# Patient Record
Sex: Female | Born: 1975 | Race: White | Hispanic: No | Marital: Married | Smoking: Current every day smoker
Health system: Southern US, Community
[De-identification: ages and names within clinical notes are randomized; demographics above are authoritative.]

## PROBLEM LIST (undated history)

## (undated) DIAGNOSIS — K219 Gastro-esophageal reflux disease without esophagitis: Secondary | ICD-10-CM

## (undated) DIAGNOSIS — E079 Disorder of thyroid, unspecified: Secondary | ICD-10-CM

## (undated) DIAGNOSIS — D1803 Hemangioma of intra-abdominal structures: Secondary | ICD-10-CM

## (undated) DIAGNOSIS — I890 Lymphedema, not elsewhere classified: Secondary | ICD-10-CM

## (undated) DIAGNOSIS — R0600 Dyspnea, unspecified: Secondary | ICD-10-CM

## (undated) DIAGNOSIS — E039 Hypothyroidism, unspecified: Secondary | ICD-10-CM

## (undated) DIAGNOSIS — G4730 Sleep apnea, unspecified: Secondary | ICD-10-CM

## (undated) DIAGNOSIS — G4733 Obstructive sleep apnea (adult) (pediatric): Secondary | ICD-10-CM

## (undated) HISTORY — DX: Disorder of thyroid, unspecified: E07.9

## (undated) HISTORY — DX: Hemangioma of intra-abdominal structures: D18.03

## (undated) HISTORY — PX: MANDIBLE FRACTURE SURGERY: SHX706

## (undated) HISTORY — DX: Lymphedema, not elsewhere classified: I89.0

## (undated) HISTORY — DX: Obstructive sleep apnea (adult) (pediatric): G47.33

## (undated) HISTORY — PX: MULTIPLE TOOTH EXTRACTIONS: SHX2053

---

## 2003-01-12 HISTORY — PX: CHOLECYSTECTOMY: SHX55

## 2003-10-30 ENCOUNTER — Ambulatory Visit: Payer: Self-pay | Admitting: Emergency Medicine

## 2003-10-30 ENCOUNTER — Emergency Department: Payer: Self-pay | Admitting: Emergency Medicine

## 2004-04-05 ENCOUNTER — Observation Stay: Payer: Self-pay

## 2004-05-17 ENCOUNTER — Observation Stay: Payer: Self-pay

## 2004-06-12 ENCOUNTER — Inpatient Hospital Stay: Payer: Self-pay

## 2005-03-06 ENCOUNTER — Emergency Department: Payer: Self-pay | Admitting: Emergency Medicine

## 2005-05-20 ENCOUNTER — Emergency Department: Payer: Self-pay | Admitting: Emergency Medicine

## 2005-09-18 ENCOUNTER — Inpatient Hospital Stay: Payer: Self-pay | Admitting: Surgery

## 2005-12-22 ENCOUNTER — Emergency Department: Payer: Self-pay | Admitting: Emergency Medicine

## 2007-05-20 ENCOUNTER — Emergency Department: Payer: Self-pay | Admitting: Emergency Medicine

## 2007-05-20 ENCOUNTER — Other Ambulatory Visit: Payer: Self-pay

## 2008-06-23 ENCOUNTER — Emergency Department: Payer: Self-pay

## 2010-09-14 ENCOUNTER — Emergency Department: Payer: Self-pay | Admitting: Emergency Medicine

## 2011-04-29 ENCOUNTER — Emergency Department: Payer: Self-pay | Admitting: Emergency Medicine

## 2012-01-12 HISTORY — PX: TUBAL LIGATION: SHX77

## 2012-01-15 ENCOUNTER — Emergency Department: Payer: Self-pay | Admitting: Emergency Medicine

## 2012-01-16 LAB — WET PREP, GENITAL

## 2012-01-16 LAB — HCG, QUANTITATIVE, PREGNANCY: Beta Hcg, Quant.: 15616 m[IU]/mL — ABNORMAL HIGH

## 2012-01-16 LAB — URINALYSIS, COMPLETE
Blood: NEGATIVE
Glucose,UR: NEGATIVE mg/dL (ref 0–75)
Nitrite: NEGATIVE
Ph: 7 (ref 4.5–8.0)
Protein: NEGATIVE
RBC,UR: 3 /HPF (ref 0–5)

## 2012-01-19 LAB — URINE CULTURE

## 2012-05-11 ENCOUNTER — Observation Stay: Payer: Self-pay | Admitting: Obstetrics and Gynecology

## 2012-05-28 ENCOUNTER — Observation Stay: Payer: Self-pay | Admitting: Obstetrics and Gynecology

## 2012-06-04 ENCOUNTER — Inpatient Hospital Stay: Payer: Self-pay

## 2012-06-05 LAB — HEMATOCRIT: HCT: 29 % — ABNORMAL LOW (ref 35.0–47.0)

## 2013-05-15 ENCOUNTER — Encounter: Payer: Self-pay | Admitting: Internal Medicine

## 2013-05-25 ENCOUNTER — Ambulatory Visit: Payer: Self-pay | Admitting: Nurse Practitioner

## 2013-11-29 ENCOUNTER — Ambulatory Visit: Payer: Self-pay | Admitting: Gastroenterology

## 2014-05-03 NOTE — Op Note (Signed)
PATIENT NAME:  Karen Hill, Karen Hill MR#:  [id number] DATE OF BIRTH:  1975/08/06  DATE OF PROCEDURE:  06/04/2012  PREOPERATIVE DIAGNOSIS: Intrauterine pregnancy, term, failure to progress, fetal intolerance to labor, desires sterilization.   POSTOPERATIVE DIAGNOSIS:  Intrauterine pregnancy, term, failure to progress, fetal intolerance to labor, desires sterilization.   OPERATION PERFORMED:  1.  Low transverse cesarean section. 2.  Bilateral partial salpingectomy.   SURGEON: Verlene Mayer, MD    FIRST ASSISTANT: Wilburn Cornelia  PREOP SITUATION: The patient got to be 7 cm dilated and had secondary arrest despite IUPC and Pitocin. The patient later developed several fairly deep decelerations, therefore, a mutual decision was made to proceed with cesarean section.   OPERATIVE FINDINGS: Six-pound female infant, [place], delivered at 16:31. Cord was around the neck x 1, very tight, and lower uterine segment was very thick.   DESCRIPTION OF PROCEDURE: After adequate conduction anesthesia, the patient was prepped and draped in routine fashion. A skin incision in modified Pfannenstiel fashion was made and carried down the various layers, and the peritoneal cavity was entered. A bladder flap was created, and the bladder was pushed down. Of note, a transverse incision was made through a fairly thick lower uterine segment, and the above-described infant was delivered without difficulty. The placenta was removed manually. The uterus was then closed with continuous lock suture of chromic 1. Several additional sutures were required for hemostasis. A bilateral partial salpingectomy was then performed with both tubes being doubly ligated with plain suture and a portion removed. All areas of surgery were inspected and found to be hemostatic. The rectus muscles were reapproximated in the midline. An On-Q pump was placed. The fascia was reapproximated with continuous sutures of Max[place]. Several triple plain sutures were placed in  the fat, and the skin was closed with skin staples. Estimated loss was 500 mL.  The patient tolerated the procedure well and left the operating room in good condition. Sponge and needle counts were said to be correct at the end of the procedure.    ____________________________ Wonda Cheng. Laurey Morale, MD pjr:cb D: 06/04/2012 17:11:32 ET T: 06/04/2012 21:53:40 ET JOB#: [id number]  cc: Wonda Cheng. Laurey Morale, MD, <Dictator> Rosina Lowenstein MD ELECTRONICALLY SIGNED 06/05/2012 3:26

## 2014-05-06 LAB — SURGICAL PATHOLOGY

## 2014-05-21 NOTE — H&P (Signed)
L&D Evaluation:  History:  HPI 39 yo G5P2022 at [redacted]w[redacted]d by 21 week ultrasound.  Pregnancy complicated by AMA, Obesty, late to prenatal care, Rh negative, smoker.  She presents to L&D with irregular uterine contractions, now spaced about 2-3 per hour.  She notes positive fetal movement, denies leakage of fluid and vaginal bleeding. A neg / HIV neg, VZI / HBsAg neg / RI /RPR NR / GBS neg   Patient's Medical History anxiety/depression   Patient's Surgical History cholecystectomy   Medications Pre Natal Vitamins   Allergies PCN   Social History tobacco   Family History Non-Contributory   ROS:  ROS All systems were reviewed.  HEENT, CNS, GI, GU, Respiratory, CV, Renal and Musculoskeletal systems were found to be normal.   Exam:  Vital Signs stable   General no apparent distress   Mental Status clear   Chest clear   Heart normal sinus rhythm   Abdomen gravid, non-tender   Estimated Fetal Weight Average for gestational age   Back no CVAT   Edema no edema   Pelvic 1.5   Mebranes Intact   FHT normal rate with no decels   FHT Description 150/mod var/ no accels/no decels   Other Bedside ultrasound performed by me for BPP: Score 8/8 very easily.  Fetus moved nearly non-stop throughout entire exam.   Impression:  Impression early labor, Reassuring antenatal testing with BPP 8/10   Plan:  Plan EFM/NST, discharge   Follow Up Appointment already scheduled. 3 days   Electronic Signatures: Will Bonnet (MD)  (Signed 18-May-14 23:58)  Authored: L&D Evaluation   Last Updated: 18-May-14 23:58 by Will Bonnet (MD)

## 2014-09-27 ENCOUNTER — Other Ambulatory Visit: Payer: Self-pay | Admitting: Nurse Practitioner

## 2014-09-27 DIAGNOSIS — N83209 Unspecified ovarian cyst, unspecified side: Secondary | ICD-10-CM

## 2014-10-04 ENCOUNTER — Ambulatory Visit
Admission: RE | Admit: 2014-10-04 | Discharge: 2014-10-04 | Disposition: A | Discharge status: Home / Self Care | Payer: Medicaid Other | Source: Ambulatory Visit | Attending: Nurse Practitioner | Admitting: Nurse Practitioner

## 2014-10-04 DIAGNOSIS — N8320 Unspecified ovarian cysts: Secondary | ICD-10-CM | POA: Insufficient documentation

## 2014-10-04 DIAGNOSIS — N83209 Unspecified ovarian cyst, unspecified side: Secondary | ICD-10-CM

## 2016-03-03 ENCOUNTER — Ambulatory Visit: Payer: Self-pay | Admitting: Podiatry

## 2016-03-12 ENCOUNTER — Ambulatory Visit (INDEPENDENT_AMBULATORY_CARE_PROVIDER_SITE_OTHER): Payer: Medicaid Other

## 2016-03-12 ENCOUNTER — Ambulatory Visit (INDEPENDENT_AMBULATORY_CARE_PROVIDER_SITE_OTHER): Payer: Medicaid Other | Admitting: Podiatry

## 2016-03-12 ENCOUNTER — Encounter: Payer: Self-pay | Admitting: Podiatry

## 2016-03-12 VITALS — BP 157/100 | HR 47 | Resp 16

## 2016-03-12 DIAGNOSIS — M7752 Other enthesopathy of left foot: Secondary | ICD-10-CM | POA: Diagnosis not present

## 2016-03-12 DIAGNOSIS — M722 Plantar fascial fibromatosis: Secondary | ICD-10-CM

## 2016-03-12 DIAGNOSIS — M7751 Other enthesopathy of right foot: Secondary | ICD-10-CM | POA: Diagnosis not present

## 2016-03-12 MED ORDER — METHYLPREDNISOLONE 4 MG PO TBPK: ORAL_TABLET | ORAL | 0 refills | Status: DC

## 2016-03-12 MED ORDER — MELOXICAM 15 MG PO TABS: 15.0000 mg | ORAL_TABLET | Freq: Every day | ORAL | 1 refills | Status: AC

## 2016-03-28 MED ORDER — TRIAMCINOLONE ACETONIDE 10 MG/ML IJ SUSP: 10.0000 mg | Freq: Once | INTRAMUSCULAR | Status: DC

## 2016-03-28 NOTE — Progress Notes (Signed)
   Subjective:  41 year old female morbidly obese presents today for evaluation of bilateral heel pain is been going on for approximately 1 year. Right foot more symptomatic than the left. Patient states is very painful walk. At the moment there are no alleviating factors.    Objective/Physical Exam General: The patient is alert and oriented x3 in no acute distress.  Dermatology: Skin is warm, dry and supple bilateral lower extremities. Negative for open lesions or macerations.  Vascular: Palpable pedal pulses bilaterally. No edema or erythema noted. Capillary refill within normal limits.  Neurological: Epicritic and protective threshold grossly intact bilaterally.   Musculoskeletal Exam: Pain on palpation noted to the posterior tubercle the bilateral heels. Pain with direct compression of the posterior heel consistent with the retrocalcaneal bursitis.   Radiographic Exam:  Normal osseous mineralization. Joint spaces preserved. No fracture/dislocation/boney destruction.  Small posterior heel spur noted to the posterior heels bilateral  Assessment: #1 retrocalcaneal bursitis bilateral   Plan of Care:  #1 Patient was evaluated. #2 injection of 0.5 mL Celestone Soluspan injected into the retrocalcaneal bursa of the right posterior heel #3 prescription for Medrol Dosepak #4 prescription for meloxicam #5 return to clinic in 4 weeks   Edrick Kins, DPM [facility]  Dr. Edrick Kins, [address]                                        [place], [place] [address]                Office [phone]  Fax [phone]

## 2017-06-16 ENCOUNTER — Other Ambulatory Visit: Payer: Self-pay | Admitting: Nurse Practitioner

## 2017-06-16 DIAGNOSIS — Z1231 Encounter for screening mammogram for malignant neoplasm of breast: Secondary | ICD-10-CM

## 2017-07-15 ENCOUNTER — Encounter: Payer: Self-pay | Admitting: Radiology

## 2017-07-15 ENCOUNTER — Ambulatory Visit
Admission: RE | Admit: 2017-07-15 | Discharge: 2017-07-15 | Disposition: A | Discharge status: Home / Self Care | Payer: Medicaid Other | Source: Ambulatory Visit | Attending: Nurse Practitioner | Admitting: Nurse Practitioner

## 2017-07-15 DIAGNOSIS — Z1231 Encounter for screening mammogram for malignant neoplasm of breast: Secondary | ICD-10-CM | POA: Diagnosis not present

## 2017-07-19 ENCOUNTER — Other Ambulatory Visit: Payer: Self-pay | Admitting: Nurse Practitioner

## 2017-07-19 DIAGNOSIS — N6320 Unspecified lump in the left breast, unspecified quadrant: Secondary | ICD-10-CM

## 2017-07-19 DIAGNOSIS — R928 Other abnormal and inconclusive findings on diagnostic imaging of breast: Secondary | ICD-10-CM

## 2017-07-22 ENCOUNTER — Ambulatory Visit
Admission: RE | Admit: 2017-07-22 | Discharge: 2017-07-22 | Disposition: A | Discharge status: Home / Self Care | Payer: Medicaid Other | Source: Ambulatory Visit | Attending: Nurse Practitioner | Admitting: Nurse Practitioner

## 2017-07-22 DIAGNOSIS — N6320 Unspecified lump in the left breast, unspecified quadrant: Secondary | ICD-10-CM | POA: Diagnosis present

## 2017-07-22 DIAGNOSIS — R928 Other abnormal and inconclusive findings on diagnostic imaging of breast: Secondary | ICD-10-CM

## 2017-07-22 DIAGNOSIS — N6322 Unspecified lump in the left breast, upper inner quadrant: Secondary | ICD-10-CM | POA: Insufficient documentation

## 2017-07-25 ENCOUNTER — Other Ambulatory Visit: Payer: Self-pay | Admitting: Nurse Practitioner

## 2017-07-25 DIAGNOSIS — N63 Unspecified lump in unspecified breast: Secondary | ICD-10-CM

## 2017-11-16 ENCOUNTER — Encounter: Payer: Self-pay | Admitting: Surgery

## 2017-11-16 ENCOUNTER — Ambulatory Visit (INDEPENDENT_AMBULATORY_CARE_PROVIDER_SITE_OTHER): Payer: Medicaid Other | Admitting: Surgery

## 2017-11-16 ENCOUNTER — Other Ambulatory Visit: Payer: Self-pay

## 2017-11-16 VITALS — BP 107/72 | HR 92 | Temp 97.2°F | Ht 66.0 in | Wt 241.0 lb

## 2017-11-16 DIAGNOSIS — K439 Ventral hernia without obstruction or gangrene: Secondary | ICD-10-CM | POA: Diagnosis not present

## 2017-11-16 NOTE — Patient Instructions (Addendum)
Goals discussed for weight loss and exercise prior to surgery Follow up in 4-6 weeks  Hernia, Adult A hernia is the bulging of an organ or tissue through a weak spot in the muscles of the abdomen (abdominal wall). Hernias develop most often near the navel or groin. There are many kinds of hernias. Common kinds include:  Femoral hernia. This kind of hernia develops under the groin in the upper thigh area.  Inguinal hernia. This kind of hernia develops in the groin or scrotum.  Umbilical hernia. This kind of hernia develops near the navel.  Hiatal hernia. This kind of hernia causes part of the stomach to be pushed up into the chest.  Incisional hernia. This kind of hernia bulges through a scar from an abdominal surgery.  What are the causes? This condition may be caused by:  Heavy lifting.  Coughing over a long period of time.  Straining to have a bowel movement.  An incision made during an abdominal surgery.  A birth defect (congenital defect).  Excess weight or obesity.  Smoking.  Poor nutrition.  Cystic fibrosis.  Excess fluid in the abdomen.  Undescended testicles.  What are the signs or symptoms? Symptoms of a hernia include:  A lump on the abdomen. This is the first sign of a hernia. The lump may become more obvious with standing, straining, or coughing. It may get bigger over time if it is not treated or if the condition causing it is not treated.  Pain. A hernia is usually painless, but it may become painful over time if treatment is delayed. The pain is usually dull and may get worse with standing or lifting heavy objects.  Sometimes a hernia gets tightly squeezed in the weak spot (strangulated) or stuck there (incarcerated) and causes additional symptoms. These symptoms may include:  Vomiting.  Nausea.  Constipation.  Irritability.  How is this diagnosed? A hernia may be diagnosed with:  A physical exam. During the exam your health care provider  may ask you to cough or to make a specific movement, because a hernia is usually more visible when you move.  Imaging tests. These can include: ? X-rays. ? Ultrasound. ? CT scan.  How is this treated? A hernia that is small and painless may not need to be treated. A hernia that is large or painful may be treated with surgery. Inguinal hernias may be treated with surgery to prevent incarceration or strangulation. Strangulated hernias are always treated with surgery, because lack of blood to the trapped organ or tissue can cause it to die. Surgery to treat a hernia involves pushing the bulge back into place and repairing the weak part of the abdomen. Follow these instructions at home:  Avoid straining.  Do not lift anything heavier than 10 lb (4.5 kg).  Lift with your leg muscles, not your back muscles. This helps avoid strain.  When coughing, try to cough gently.  Prevent constipation. Constipation leads to straining with bowel movements, which can make a hernia worse or cause a hernia repair to break down. You can prevent constipation by: ? Eating a high-fiber diet that includes plenty of fruits and vegetables. ? Drinking enough fluids to keep your urine clear or pale yellow. Aim to drink 6-8 glasses of water per day. ? Using a stool softener as directed by your health care provider.  Lose weight, if you are overweight.  Do not use any tobacco products, including cigarettes, chewing tobacco, or electronic cigarettes. If you need help quitting,  ask your health care provider.  Keep all follow-up visits as directed by your health care provider. This is important. Your health care provider may need to monitor your condition. Contact a health care provider if:  You have swelling, redness, and pain in the affected area.  Your bowel habits change. Get help right away if:  You have a fever.  You have abdominal pain that is getting worse.  You feel nauseous or you vomit.  You cannot  push the hernia back in place by gently pressing on it while you are lying down.  The hernia: ? Changes in shape or size. ? Is stuck outside the abdomen. ? Becomes discolored. ? Feels hard or tender. This information is not intended to replace advice given to you by your health care provider. Make sure you discuss any questions you have with your health care provider. Document Released: 12/28/2004 Document Revised: 05/28/2015 Document Reviewed: 11/07/2013 Elsevier Interactive Patient Education  2017 [place] with Quitting Smoking Quitting smoking is a physical and mental challenge. You will face cravings, withdrawal symptoms, and temptation. Before quitting, work with your health care provider to make a plan that can help you cope. Preparation can help you quit and keep you from giving in. How can I cope with cravings? Cravings usually last for 5-10 minutes. If you get through it, the craving will pass. Consider taking the following actions to help you cope with cravings:  Keep your mouth busy: ? Chew sugar-free gum. ? Suck on hard candies or a straw. ? Brush your teeth.  Keep your hands and body busy: ? Immediately change to a different activity when you feel a craving. ? Squeeze or play with a ball. ? Do an activity or a hobby, like making bead jewelry, practicing needlepoint, or working with wood. ? Mix up your normal routine. ? Take a short exercise break. Go for a quick walk or run up and down stairs. ? Spend time in public places where smoking is not allowed.  Focus on doing something kind or helpful for someone else.  Call a friend or family member to talk during a craving.  Join a support group.  Call a quit line, such as 1-800-QUIT-NOW.  Talk with your health care provider about medicines that might help you cope with cravings and make quitting easier for you.  How can I deal with withdrawal symptoms? Your body may experience negative effects as it tries  to get used to not having nicotine in the system. These effects are called withdrawal symptoms. They may include:  Feeling hungrier than normal.  Trouble concentrating.  Irritability.  Trouble sleeping.  Feeling depressed.  Restlessness and agitation.  Craving a cigarette.  To manage withdrawal symptoms:  Avoid places, people, and activities that trigger your cravings.  Remember why you want to quit.  Get plenty of sleep.  Avoid coffee and other caffeinated drinks. These may worsen some of your symptoms.  How can I handle social situations? Social situations can be difficult when you are quitting smoking, especially in the first few weeks. To manage this, you can:  Avoid parties, bars, and other social situations where people might be smoking.  Avoid alcohol.  Leave right away if you have the urge to smoke.  Explain to your family and friends that you are quitting smoking. Ask for understanding and support.  Plan activities with friends or family where smoking is not an option.  What are some ways I can cope with  stress? Wanting to smoke may cause stress, and stress can make you want to smoke. Find ways to manage your stress. Relaxation techniques can help. For example:  Breathe slowly and deeply, in through your nose and out through your mouth.  Listen to soothing, relaxing music.  Talk with a family member or friend about your stress.  Light a candle.  Soak in a bath or take a shower.  Think about a peaceful place.  What are some ways I can prevent weight gain? Be aware that many people gain weight after they quit smoking. However, not everyone does. To keep from gaining weight, have a plan in place before you quit and stick to the plan after you quit. Your plan should include:  Having healthy snacks. When you have a craving, it may help to: ? Eat plain popcorn, crunchy carrots, celery, or other cut vegetables. ? Chew sugar-free gum.  Changing how you  eat: ? Eat small portion sizes at meals. ? Eat 4-6 small meals throughout the day instead of 1-2 large meals a day. ? Be mindful when you eat. Do not watch television or do other things that might distract you as you eat.  Exercising regularly: ? Make time to exercise each day. If you do not have time for a long workout, do short bouts of exercise for 5-10 minutes several times a day. ? Do some form of strengthening exercise, like weight lifting, and some form of aerobic exercise, like running or swimming.  Drinking plenty of water or other low-calorie or no-calorie drinks. Drink 6-8 glasses of water daily, or as much as instructed by your health care provider.  Summary  Quitting smoking is a physical and mental challenge. You will face cravings, withdrawal symptoms, and temptation to smoke again. Preparation can help you as you go through these challenges.  You can cope with cravings by keeping your mouth busy (such as by chewing gum), keeping your body and hands busy, and making calls to family, friends, or a helpline for people who want to quit smoking.  You can cope with withdrawal symptoms by avoiding places where people smoke, avoiding drinks with caffeine, and getting plenty of rest.  Ask your health care provider about the different ways to prevent weight gain, avoid stress, and handle social situations. This information is not intended to replace advice given to you by your health care provider. Make sure you discuss any questions you have with your health care provider. Document Released: 12/26/2015 Document Revised: 12/26/2015 Document Reviewed: 12/26/2015 Elsevier Interactive Patient Education  [employer].

## 2017-11-18 NOTE — Progress Notes (Signed)
Patient ID: Karen Hill, female   DOB: 03/19/1975, 42 y.o.   MRN: [id number]  HPI Rayvon Brandvold is a 42 y.o. female seen in consultation for a symptomatic ventral hernia.  Reports she has intermittent abdominal pain that is sharp moderate and worsening with Valsalva.  She has had a prior cholecystectomy and tubal ligation.  She smokes about a pack a day.  Her BMI is 39. She is able to perform more than 4 METS w/o SOB or C/P.  She has tried weight loss without any success. No Fevers no chills no evidence of incarceration or strangulation.    HPI  Past Medical History:  Diagnosis Date  . Liver hemangioma   . Lymphedema   . Thyroid disease     Past Surgical History:  Procedure Laterality Date  . CESAREAN SECTION  2014  . CHOLECYSTECTOMY  2005  . MANDIBLE FRACTURE SURGERY    . MULTIPLE TOOTH EXTRACTIONS    . TUBAL LIGATION  2014    Family History  Problem Relation Age of Onset  . Diabetes Mother   . Hypertension Father   . Breast cancer Neg Hx     Social History Social History   Tobacco Use  . Smoking status: Current Every Day Smoker    Packs/day: 1.00    Years: 10.00    Pack years: 10.00    Types: Cigarettes  . Smokeless tobacco: Never Used  Substance Use Topics  . Alcohol use: Yes    Comment: rare  . Drug use: No    Allergies  Allergen Reactions  . Penicillin G Hives and Itching  . Shellfish Allergy Nausea And Vomiting    Current Outpatient Medications  Medication Sig Dispense Refill  . APPLE CIDER VINEGAR PO Take by mouth daily.    Marland Kitchen levothyroxine (SYNTHROID, LEVOTHROID) 100 MCG tablet Take 100 mcg by mouth daily before breakfast.    . PROAIR HFA 108 (90 Base) MCG/ACT inhaler INHALE 1 PUFF BY MOUTH EVERY 4-6 HOURS AS NEEDED FOR SHORTNESS OF BREATH/WHEEZING (RESCUE INHALER)  1  . SYMBICORT 80-4.5 MCG/ACT inhaler INHALE 1 PUFF BY MOUTH TWICE DAILY, ONE IN AM AND ONE IN PM (MAINTENANCE INHALER)  5  . Vitamin D, Ergocalciferol, (DRISDOL) 1.25 MG (50000 UT)  CAPS capsule TAKE ONE CAPSULE BY MOUTH ONCE A WEEK FOR LOW VIT D  11   Current Facility-Administered Medications  Medication Dose Route Frequency Provider Last Rate Last Dose  . triamcinolone acetonide (KENALOG) 10 MG/ML injection 10 mg  10 mg Other Once Edrick Kins, DPM         Review of Systems Full ROS  was asked and was negative except for the information on the HPI  Physical Exam Blood pressure 107/72, pulse 92, temperature (!) 97.2 F (36.2 C), temperature source Skin, height 5\' 6"  (1.676 m), weight 241 lb (109.3 kg), last menstrual period 11/06/2017, SpO2 98 %. CONSTITUTIONAL: NAD EYES: Pupils are equal, round, and reactive to light, Sclera are non-icteric. EARS, NOSE, MOUTH AND THROAT: The oropharynx is clear. The oral mucosa is pink and moist. Hearing is intact to voice. LYMPH NODES:  Lymph nodes in the neck are normal. RESPIRATORY:  Lungs are clear. There is normal respiratory effort, with equal breath sounds bilaterally, and without pathologic use of accessory muscles. CARDIOVASCULAR: Heart is regular without murmurs, gallops, or rubs. GI: The abdomen issoft, nontender, and nondistended. Reducible Ventral hernia.    There are no palpable masses. There is no hepatosplenomegaly. There are normal bowel sounds in all  quadrants. GU: Rectal deferred.   MUSCULOSKELETAL: Normal muscle strength and tone. No cyanosis or edema.   SKIN: Turgor is good and there are no pathologic skin lesions or ulcers. NEUROLOGIC: Motor and sensation is grossly normal. Cranial nerves are grossly intact. PSYCH:  Oriented to person, place and time. Affect is normal.  Data Reviewed  I have personally reviewed the patient's imaging, laboratory findings and medical records.    Assessment/Plan Is a 42 year old female with a symptomatic ventral hernia and multiple comorbidities including active smoking and super morbidly obesity.  Had a lengthy discussion with the patient about the situation.  I am not  denying her surgical intervention but I am encouraging her to modify her risk factors to include weight loss, smoking cessation and medical optimization.  I have offered her to refer her to dietary support and also counseling.  Currently she is refusing and she wishes to try on her own.  Made her aware about the potential complications from ventral hernia surgery in a patient who is actively smoking and is super morbidly obese.  The chances of recurrence, mesh infection and chronic pain are significantly higher. With All this being said I will see her back in about 6 to 8 weeks.  We will measure her weight and also determine her smoking status. So offer her nicotine patch or medication to them prove the chances of smoking cessation and currently she is not interested. I spent 45 minutes in this encounter w > 50% spent in coordination and counseling of her care.  Caroleen Hamman, MD FACS General Surgeon 11/18/2017, 11:08 AM

## 2017-12-14 ENCOUNTER — Ambulatory Visit (INDEPENDENT_AMBULATORY_CARE_PROVIDER_SITE_OTHER): Payer: Medicaid Other | Admitting: Surgery

## 2017-12-14 ENCOUNTER — Other Ambulatory Visit: Payer: Self-pay

## 2017-12-14 ENCOUNTER — Encounter: Payer: Self-pay | Admitting: Surgery

## 2017-12-14 VITALS — BP 117/78 | HR 99 | Temp 97.7°F | Resp 20 | Ht 66.0 in | Wt 236.0 lb

## 2017-12-14 DIAGNOSIS — K439 Ventral hernia without obstruction or gangrene: Secondary | ICD-10-CM | POA: Diagnosis not present

## 2017-12-14 NOTE — Patient Instructions (Addendum)
Patient to try to quit smoking and weight loss about 25 pounds . Return in 6 weeks.

## 2017-12-14 NOTE — Progress Notes (Signed)
Outpatient Surgical Follow Up  12/14/2017  Karen Hill is an 42 y.o. female.   Chief Complaint  Patient presents with  . Routine Post Op    ventral hernia     HPI: f/u ventral hernia. She continues to have some intermittent sharp pains, no incarceration or strangulation. No emesis,  No nausea. She has lost 5 pounds and is down to 1 cigarette a day from 25. She is exercising and eating better.  Past Medical History:  Diagnosis Date  . Liver hemangioma   . Lymphedema   . Thyroid disease     Past Surgical History:  Procedure Laterality Date  . CESAREAN SECTION  2014  . CHOLECYSTECTOMY  2005  . MANDIBLE FRACTURE SURGERY    . MULTIPLE TOOTH EXTRACTIONS    . TUBAL LIGATION  2014    Family History  Problem Relation Age of Onset  . Diabetes Mother   . Hypertension Father   . Breast cancer Neg Hx     Social History:  reports that she has been smoking cigarettes. She has a 10.00 pack-year smoking history. She has never used smokeless tobacco. She reports that she drinks alcohol. She reports that she does not use drugs.  Allergies:  Allergies  Allergen Reactions  . Penicillin G Hives and Itching  . Shellfish Allergy Nausea And Vomiting    Medications reviewed.    ROS Full ROS performed and is otherwise negative other than what is stated in HPI   BP 117/78   Pulse 99   Temp 97.7 F (36.5 C) (Skin)   Resp 20   Ht 5\' 6"  (1.676 m)   Wt 236 lb (107 kg)   SpO2 99%   BMI 38.09 kg/m   Physical Exam  Constitutional: She is oriented to person, place, and time. She appears well-developed and well-nourished. No distress.  Pulmonary/Chest: Effort normal. No respiratory distress.  Abdominal: Soft. She exhibits no distension and no mass. There is no rebound and no guarding.  Reducible ventral hernia epigastric area  Neurological: She is alert and oriented to person, place, and time. No cranial nerve deficit. Coordination normal.  Skin: Skin is warm and dry. Capillary  refill takes less than 2 seconds.  Psychiatric: She has a normal mood and affect. Her behavior is normal. Judgment and thought content normal.  Nursing note and vitals reviewed.        Assessment/Plan:  Ventral H on a morbidly obese pt trying to quit smokin. Apparently she has done very well w smoking cessation and is down to 1 cigarrette a day. We have establish that her weight goal should be 210 lbs. This is aprx 15% of her original weight and I do think is a reasonable goal. She has lost 5 pounds so far. She understands and is in agreement.  We will see her back in 6-8 weeks to reassess.    Caroleen Hamman, MD [facility] General Surgeon

## 2018-01-26 ENCOUNTER — Other Ambulatory Visit: Payer: Medicaid Other

## 2018-01-30 ENCOUNTER — Ambulatory Visit: Payer: Medicaid Other | Admitting: Surgery

## 2018-02-15 ENCOUNTER — Other Ambulatory Visit: Payer: Medicaid Other

## 2018-02-20 ENCOUNTER — Ambulatory Visit: Payer: Medicaid Other | Admitting: Surgery

## 2018-02-24 ENCOUNTER — Ambulatory Visit
Admission: RE | Admit: 2018-02-24 | Discharge: 2018-02-24 | Disposition: A | Discharge status: Home / Self Care | Payer: Medicaid Other | Source: Ambulatory Visit | Attending: Nurse Practitioner | Admitting: Nurse Practitioner

## 2018-02-24 DIAGNOSIS — N63 Unspecified lump in unspecified breast: Secondary | ICD-10-CM

## 2018-02-27 ENCOUNTER — Other Ambulatory Visit: Payer: Self-pay

## 2018-02-27 ENCOUNTER — Ambulatory Visit (INDEPENDENT_AMBULATORY_CARE_PROVIDER_SITE_OTHER): Payer: Medicaid Other | Admitting: Surgery

## 2018-02-27 ENCOUNTER — Encounter: Payer: Self-pay | Admitting: Surgery

## 2018-02-27 VITALS — BP 106/74 | HR 101 | Temp 98.6°F | Resp 15 | Ht 66.0 in | Wt 232.0 lb

## 2018-02-27 DIAGNOSIS — K439 Ventral hernia without obstruction or gangrene: Secondary | ICD-10-CM

## 2018-02-27 NOTE — Progress Notes (Signed)
Outpatient Surgical Follow Up  02/27/2018  Karen Hill is an 43 y.o. female.   Chief Complaint  Patient presents with  . Routine Post Op    HPI: Is following up for a symptomatic ventral hernia.  She states that her symptoms continue and have increased in frequency and severity.  She is down to 232 pounds that is overall 9 pounds less that when I see her first.  Continues smokes now less than before. Has intermittent moderate pain that is sharp in nature and worsening with Valsalva  Past Medical History:  Diagnosis Date  . Liver hemangioma   . Lymphedema   . Thyroid disease     Past Surgical History:  Procedure Laterality Date  . CESAREAN SECTION  2014  . CHOLECYSTECTOMY  2005  . MANDIBLE FRACTURE SURGERY    . MULTIPLE TOOTH EXTRACTIONS    . TUBAL LIGATION  2014    Family History  Problem Relation Age of Onset  . Diabetes Mother   . Hypertension Father   . Breast cancer Neg Hx     Social History:  reports that she has been smoking cigarettes. She has a 10.00 pack-year smoking history. She has never used smokeless tobacco. She reports current alcohol use. She reports that she does not use drugs.  Allergies:  Allergies  Allergen Reactions  . Penicillin G Hives and Itching  . Shellfish Allergy Nausea And Vomiting    Medications reviewed.    ROS Full ROS performed and is otherwise negative other than what is stated in HPI   BP 106/74   Pulse (!) 101   Temp 98.6 F (37 C)   Resp 15   Ht 5\' 6"  (1.676 m)   Wt 232 lb (105.2 kg)   LMP 02/10/2018 (Exact Date)   SpO2 98%   BMI 37.45 kg/m   Physical Exam Vitals signs and nursing note reviewed. Exam conducted with a chaperone present.  Constitutional:      Appearance: Normal appearance. She is obese.  Pulmonary:     Effort: Pulmonary effort is normal. No respiratory distress.     Breath sounds: No stridor.  Abdominal:     General: Abdomen is flat. There is no distension.     Tenderness: There is no  abdominal tenderness. There is no right CVA tenderness, guarding or rebound.     Hernia: A hernia is present.     Comments: Reducible ventral hernia epigastric  Skin:    Capillary Refill: Capillary refill takes less than 2 seconds.  Neurological:     General: No focal deficit present.     Mental Status: She is alert and oriented to person, place, and time. Mental status is at baseline.  Psychiatric:        Mood and Affect: Mood normal.        Behavior: Behavior normal.        Thought Content: Thought content normal.        Judgment: Judgment normal.      Assessment/Plan: 43 year old with a symptomatic ventral hernia in need for repair at some point in time.  She has lost about 5 pounds since the last time I saw her.  Discussed with her the importance of weight optimization.  I will see her back in 2 months.  She is committed to weight loss.  At this time the hernia is reducible and there is no need for emergent surgical intervention   Greater than 50% of the 25 minutes  visit was spent in  counseling/coordination of care   Caroleen Hamman, MD [facility] General Surgeon

## 2018-02-28 ENCOUNTER — Other Ambulatory Visit: Payer: Self-pay | Admitting: Nurse Practitioner

## 2018-02-28 DIAGNOSIS — N63 Unspecified lump in unspecified breast: Secondary | ICD-10-CM

## 2018-05-10 ENCOUNTER — Encounter: Payer: Self-pay | Admitting: *Deleted

## 2018-05-17 ENCOUNTER — Ambulatory Visit: Payer: Medicaid Other | Admitting: Surgery

## 2018-06-28 ENCOUNTER — Other Ambulatory Visit: Payer: Self-pay

## 2018-06-28 ENCOUNTER — Telehealth: Payer: Medicaid Other | Admitting: Surgery

## 2018-06-28 NOTE — Progress Notes (Signed)
I attempted phone call multiple times. Pt was not available . May f/u in the future as a face to face in the next few weeks.

## 2018-07-19 ENCOUNTER — Other Ambulatory Visit: Payer: Medicaid Other

## 2018-09-26 IMAGING — US US BREAST*L* LIMITED INC AXILLA
1 series · 10 of 10 positions shown · non-contrast
Comparison: Baseline screening mammogram dated 07/15/2017.

CLINICAL DATA: Patient was called back from screening mammogram for
possible mass in the left breast.

EXAM:
DIGITAL DIAGNOSTIC LEFT MAMMOGRAM WITH TOMO
ULTRASOUND LEFT BREAST

[Series 1: us breast*left* limited inc axilla · 0.04mm/px · 10 of 10 slices shown]
[im 1/10]
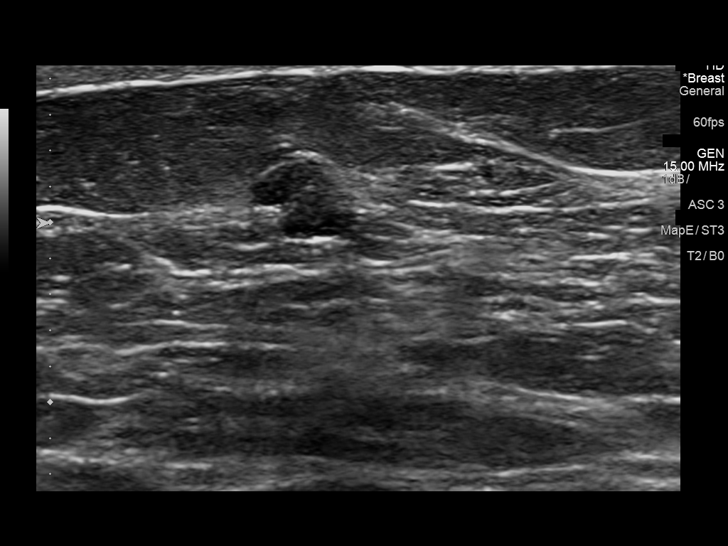
[im 2/10]
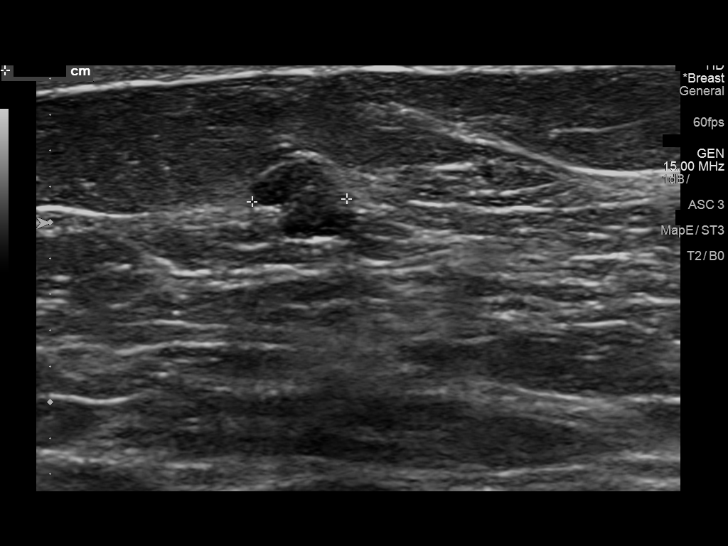
[im 3/10]
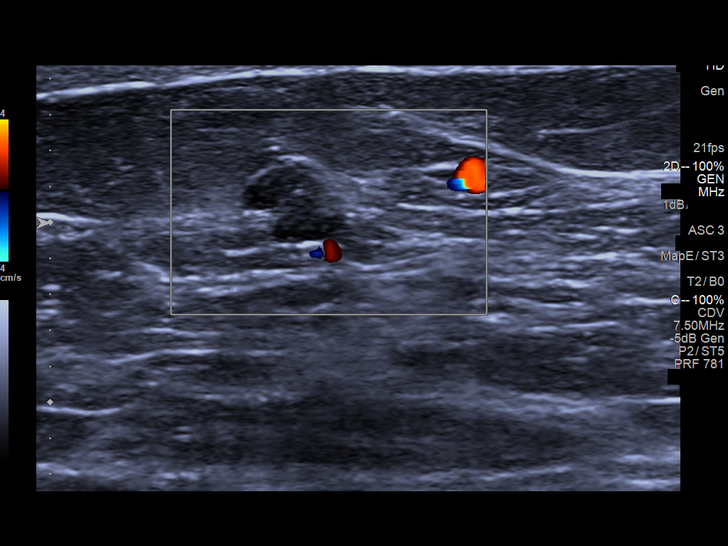
[im 4/10]
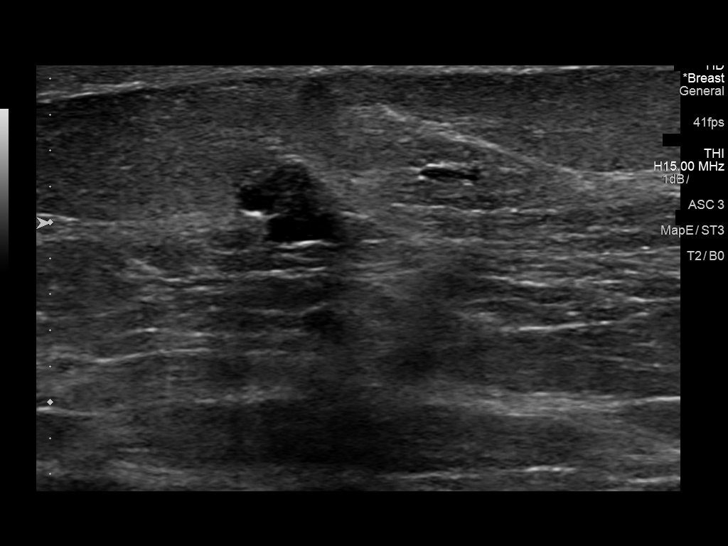
[im 5/10]
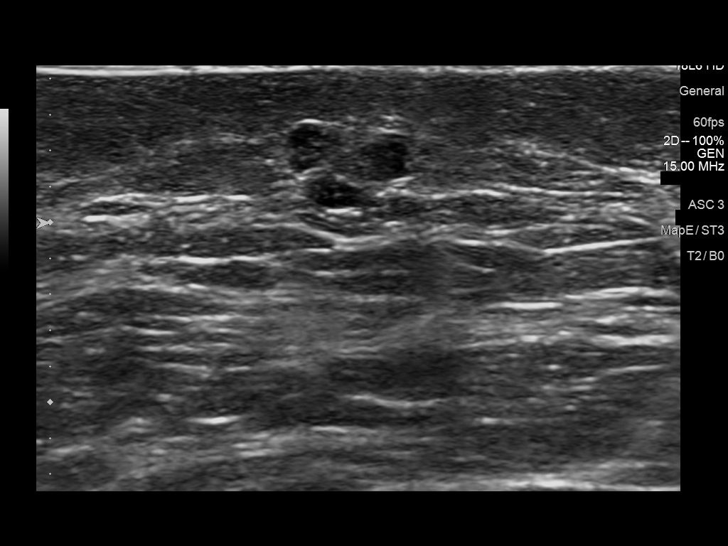
[im 6/10]
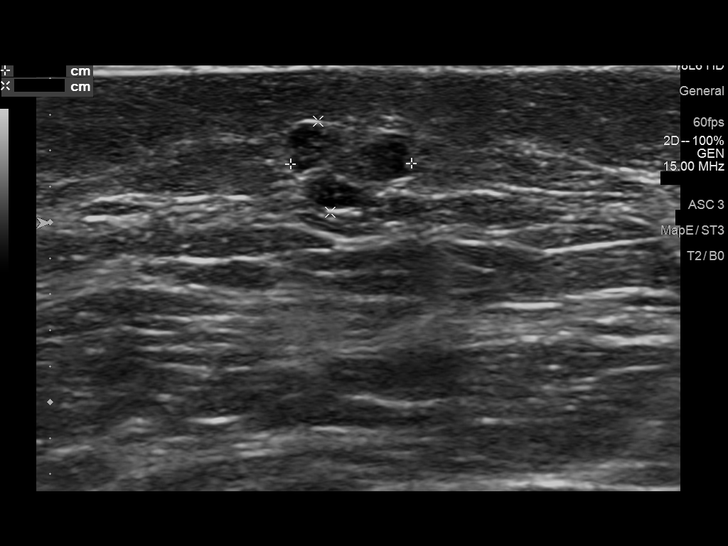
[im 7/10]
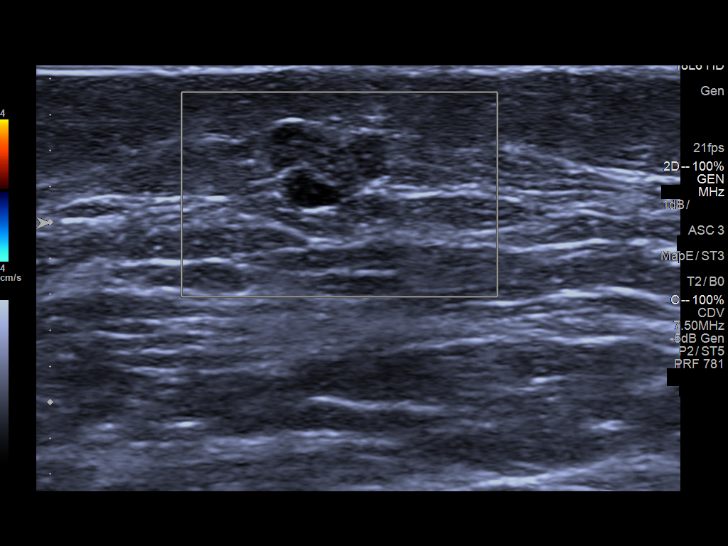
[im 8/10]
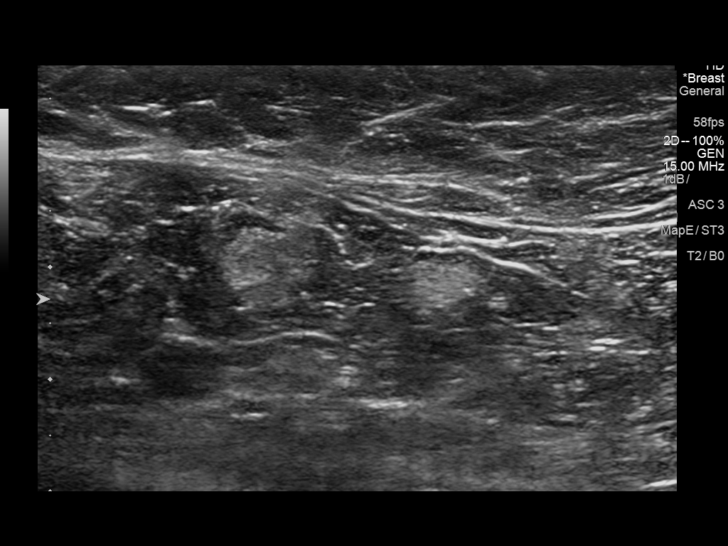
[im 9/10]
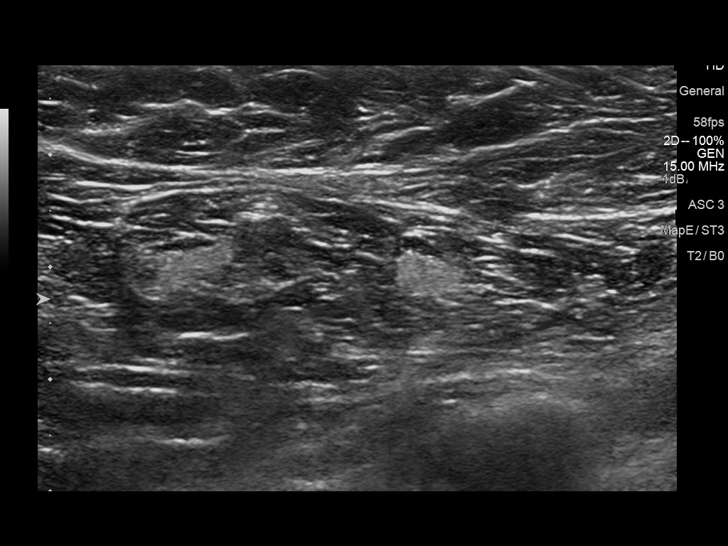
[im 10/10]
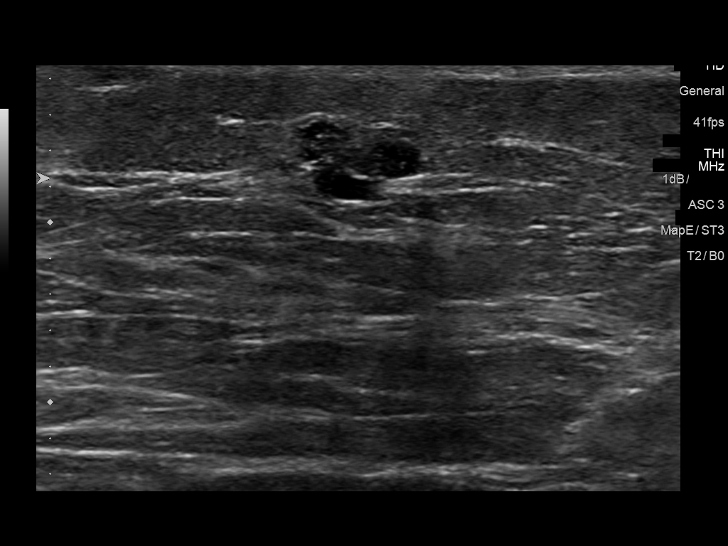

[10 of 10 positions shown; findings below may reference images not displayed]

ACR Breast Density Category b: There are scattered areas of
fibroglandular density.
FINDINGS: Additional imaging of the left breast was performed. There is
persistence of a well-circumscribed 6 mm mass in the anterior third
of the upper-outer quadrant of the left breast.

On physical exam, no mass is palpated in the upper-outer quadrant of
the left breast.

Targeted ultrasound is performed, showing a cluster of near anechoic
cysts in the left breast at 1 o'clock 3 cm from the nipple measuring
7 x 5 x 5 mm.
IMPRESSION: Probable benign cluster of cysts (apocrine metaplasia) in the 1
o'clock region of the left breast.

RECOMMENDATION:
Short-term interval follow-up left breast ultrasound in 6 months is
recommended.

I have discussed the findings and recommendations with the patient.
Results were also provided in writing at the conclusion of the
visit. If applicable, a reminder letter will be sent to the patient
regarding the next appointment.

BI-RADS CATEGORY  3: Probably benign.

## 2019-10-26 IMAGING — MG MM DIGITAL SCREENING BILAT W/ TOMO W/ CAD
8 series · 8 of 24 positions shown · non-contrast
Comparison: None.

CLINICAL DATA: Screening.

EXAM:
DIGITAL SCREENING BILATERAL MAMMOGRAM WITH TOMO AND CAD

[L MLO synth-2D]
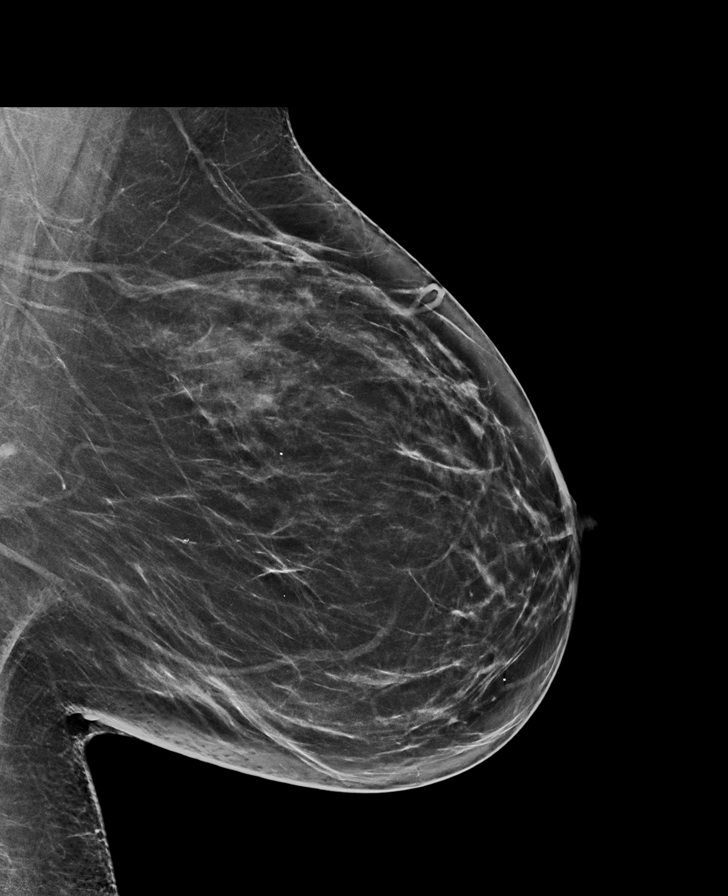

[R MLO synth-2D]
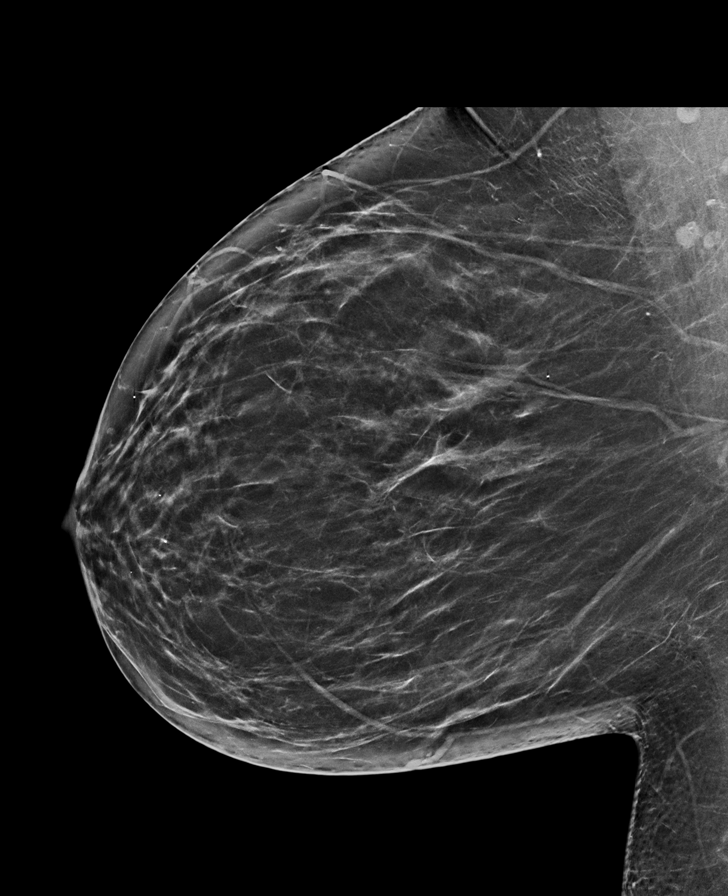

[L CC synth-2D]
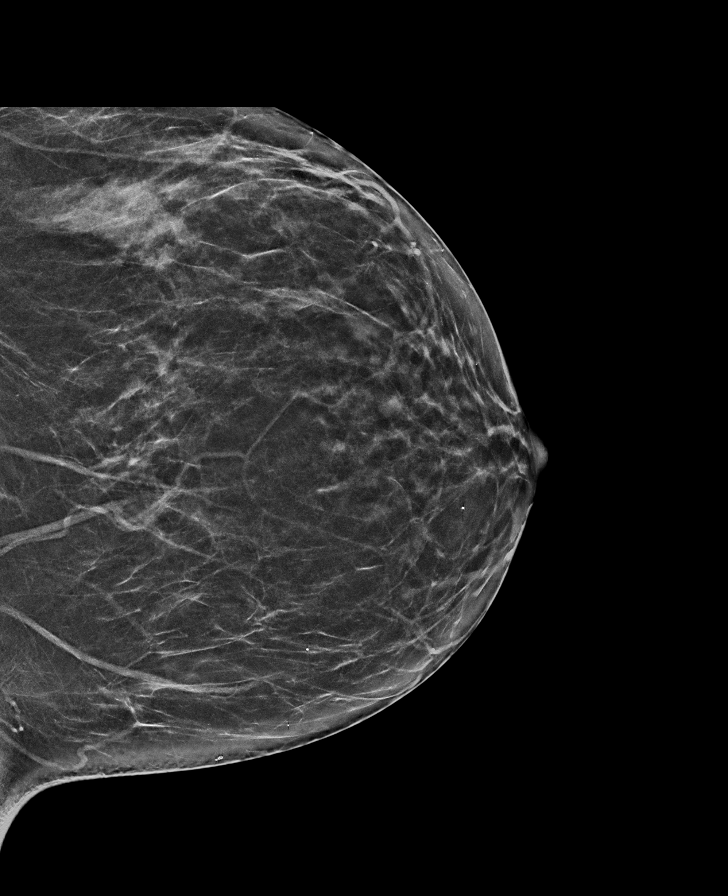

[R CC synth-2D]
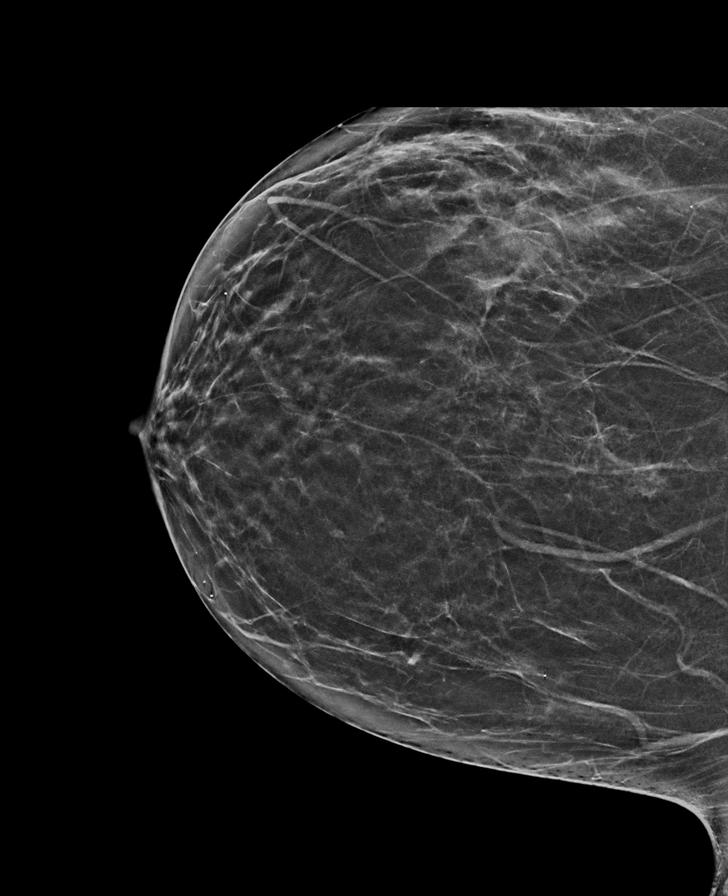

[L MLO tomo · tomo slice 39/76.0]
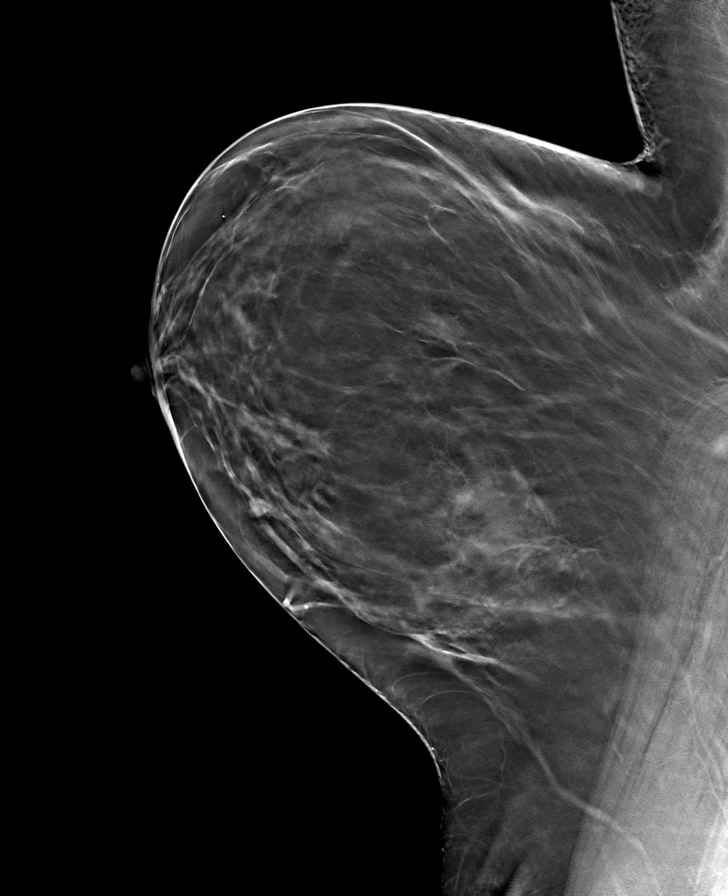

[R MLO tomo · tomo slice 37/73.0]
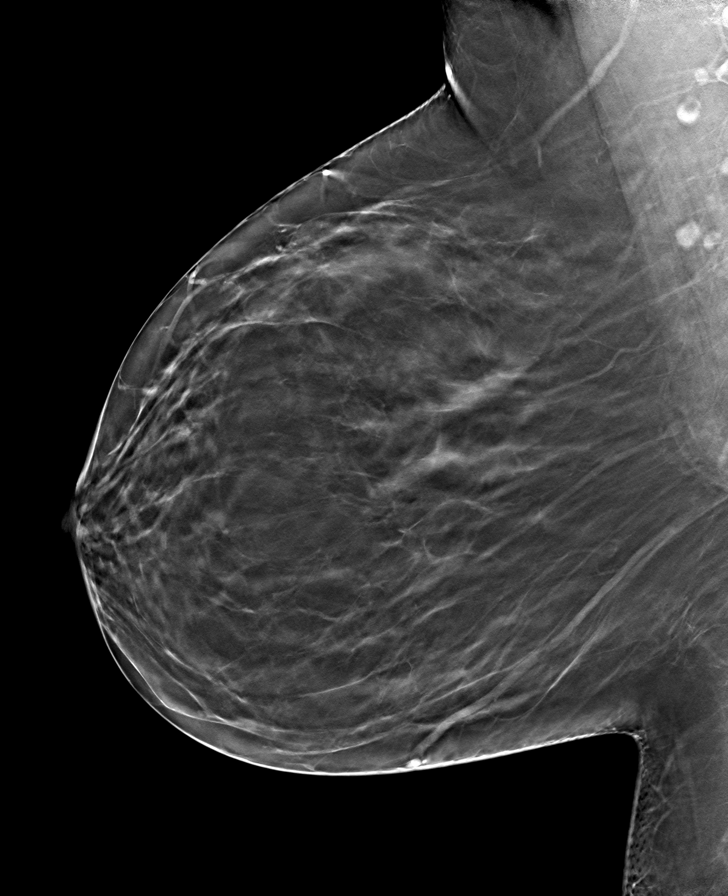

[R CC tomo · tomo slice 32/63.0]
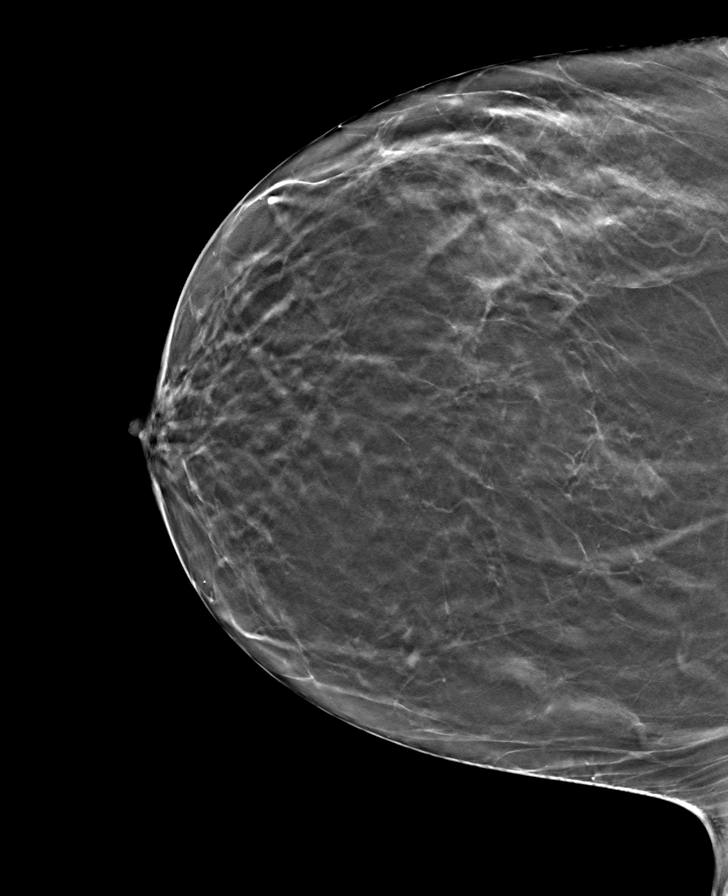

[L CC tomo · tomo slice 33/64.0]
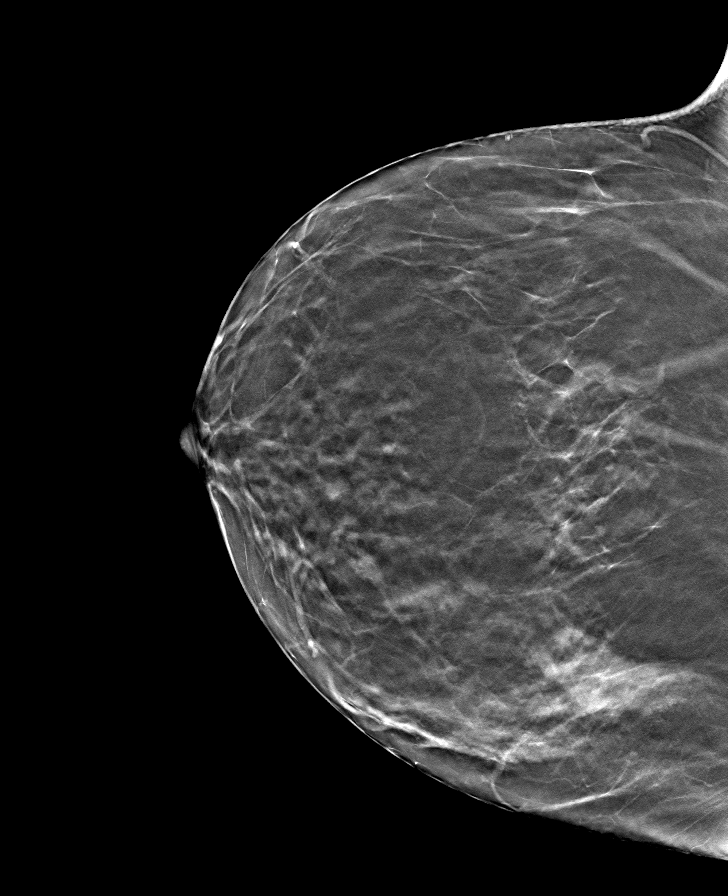

[8 of 24 positions shown; findings below may reference images not displayed]

ACR Breast Density Category b: There are scattered areas of
fibroglandular density.
FINDINGS: In the left breast, a possible mass warrants further evaluation. In
the right breast, no findings suspicious for malignancy.

Images were processed with CAD.
IMPRESSION: Further evaluation is suggested for possible mass in the left
breast.

RECOMMENDATION:
Diagnostic mammogram and possibly ultrasound of the left breast.
(Code:[id number])

The patient will be contacted regarding the findings, and additional
imaging will be scheduled.

BI-RADS CATEGORY  0: Incomplete. Need additional imaging evaluation
and/or prior mammograms for comparison.

## 2019-12-01 ENCOUNTER — Encounter: Payer: Self-pay | Admitting: Emergency Medicine

## 2019-12-01 ENCOUNTER — Other Ambulatory Visit: Payer: Self-pay

## 2019-12-01 ENCOUNTER — Emergency Department
Admission: EM | Admit: 2019-12-01 | Discharge: 2019-12-01 | Disposition: A | Discharge status: Home / Self Care | Payer: Medicaid Other | Attending: Emergency Medicine | Admitting: Emergency Medicine

## 2019-12-01 DIAGNOSIS — M5431 Sciatica, right side: Secondary | ICD-10-CM | POA: Insufficient documentation

## 2019-12-01 DIAGNOSIS — M545 Low back pain, unspecified: Secondary | ICD-10-CM | POA: Diagnosis present

## 2019-12-01 DIAGNOSIS — F17210 Nicotine dependence, cigarettes, uncomplicated: Secondary | ICD-10-CM | POA: Diagnosis not present

## 2019-12-01 MED ORDER — PREDNISONE 10 MG PO TABS: ORAL_TABLET | ORAL | 0 refills | Status: DC

## 2019-12-01 MED ORDER — DEXAMETHASONE SODIUM PHOSPHATE 10 MG/ML IJ SOLN
10.0000 mg | Freq: Once | INTRAMUSCULAR | Status: AC
  Administered 2019-12-01: 10 mg via INTRAMUSCULAR
  Filled 2019-12-01: qty 1

## 2019-12-01 NOTE — ED Provider Notes (Signed)
[facility] Emergency Department Provider Note   ____________________________________________   First MD Initiated Contact with Patient 12/01/19 1549     (approximate)  I have reviewed the triage vital signs and the nursing notes.   HISTORY  Chief Complaint Back Pain and Leg Pain   HPI Karen Hill is a 44 y.o. female presents to the ED with complaint of back pain similar to back pain that she has had in the past.  Patient states that she was diagnosed with a bulging disc several years ago and was seen by a specialist in [place].  In the past she has been given a cortisone injection which helped.  Patient denies any recent injury to her back, no urinary symptoms and no incontinence of bowel or bladder.  Patient continues to walk with a cane which is her baseline.  She has been taking over-the-counter medication without any relief.  Currently she rates her pain as 10/10.       Past Medical History:  Diagnosis Date  . Liver hemangioma   . Lymphedema   . Thyroid disease     There are no problems to display for this patient.   Past Surgical History:  Procedure Laterality Date  . CESAREAN SECTION  2014  . CHOLECYSTECTOMY  2005  . MANDIBLE FRACTURE SURGERY    . MULTIPLE TOOTH EXTRACTIONS    . TUBAL LIGATION  2014    Prior to Admission medications   Medication Sig Start Date End Date Taking? Authorizing Provider  APPLE CIDER VINEGAR PO Take by mouth daily.    [provider]  levothyroxine (SYNTHROID, LEVOTHROID) 125 MCG tablet Take 125 mcg by mouth daily before breakfast.    [provider]  predniSONE (DELTASONE) 10 MG tablet Take 6 tablets  today, on day 2 take 5 tablets, day 3 take 4 tablets, day 4 take 3 tablets, day 5 take  2 tablets and 1 tablet the last day 12/01/19   Johnn Hai, PA-C  PROAIR HFA 108 (90 Base) MCG/ACT inhaler INHALE 1 PUFF BY MOUTH EVERY 4-6 HOURS AS NEEDED FOR SHORTNESS OF BREATH/WHEEZING  (RESCUE INHALER) 10/28/17   [provider]  SYMBICORT 80-4.5 MCG/ACT inhaler INHALE 1 PUFF BY MOUTH TWICE DAILY, ONE IN AM AND ONE IN PM (MAINTENANCE INHALER) 10/28/17   [provider]  Vitamin D, Ergocalciferol, (DRISDOL) 1.25 MG (50000 UT) CAPS capsule TAKE ONE CAPSULE BY MOUTH ONCE A WEEK FOR LOW VIT D 10/14/17   [provider]    Allergies Penicillin g and Shellfish allergy  Family History  Problem Relation Age of Onset  . Diabetes Mother   . Hypertension Father   . Breast cancer Neg Hx     Social History Social History   Tobacco Use  . Smoking status: Current Every Day Smoker    Packs/day: 1.00    Years: 10.00    Pack years: 10.00    Types: Cigarettes  . Smokeless tobacco: Never Used  Substance Use Topics  . Alcohol use: Yes    Comment: rare  . Drug use: No    Review of Systems Constitutional: No fever/chills Eyes: No visual changes. Cardiovascular: Denies chest pain. Respiratory: Denies shortness of breath. Gastrointestinal: No abdominal pain.  No nausea, no vomiting.  No diarrhea.  Genitourinary: Negative for dysuria. Musculoskeletal: Positive for right lower back pain with radiation into the right leg. Skin: Negative for rash. Neurological: Negative for headaches, focal weakness or numbness. ____________________________________________   PHYSICAL EXAM:  VITAL SIGNS: ED Triage Vitals  Enc Vitals Group     BP 12/01/19 1447 124/62     Pulse Rate 12/01/19 1447 (!) 101     Resp 12/01/19 1447 16     Temp 12/01/19 1450 99.4 F (37.4 C)     Temp Source 12/01/19 1450 Oral     SpO2 12/01/19 1447 99 %     Weight 12/01/19 1448 235 lb (106.6 kg)     Height 12/01/19 1448 5\' 9"  (1.753 m)     Head Circumference --      Peak Flow --      Pain Score 12/01/19 1447 10     Pain Loc --      Pain Edu? --      Excl. in [place]? --     Constitutional: Alert and oriented. Well appearing and in no acute distress. Eyes: Conjunctivae are normal.  PERRL. EOMI. Head: Atraumatic. Neck: No stridor.   Cardiovascular: Normal rate, regular rhythm. Grossly normal heart sounds.  Good peripheral circulation. Respiratory: Normal respiratory effort.  No retractions. Lungs CTAB. Gastrointestinal: Soft and nontender. No distention.  No CVA tenderness. Musculoskeletal: No tenderness is noted on palpation of the thoracic or upper lower lumbar spine.  There is some mild tenderness noted on palpation of the lower lumbar and right SI joint area.  There is no tenderness on palpation of the left SI joint area.  Straight leg raises are negative.  Good muscle strength bilaterally and as noted above patient ambulates with the assistance of a cane. Neurologic:  Normal speech and language. No gross focal neurologic deficits are appreciated.  Skin:  Skin is warm, dry and intact. No rash noted. Psychiatric: Mood and affect are normal. Speech and behavior are normal.  ____________________________________________   LABS (all labs ordered are listed, but only abnormal results are displayed)  Labs Reviewed - No data to display    PROCEDURES  Procedure(s) performed (including Critical Care):  Procedures   ____________________________________________   INITIAL IMPRESSION / ASSESSMENT AND PLAN / ED COURSE  As part of my medical decision making, I reviewed the following data within the electronic MEDICAL RECORD NUMBER Notes from prior ED visits and [place] Controlled Substance Database  44 year old female presents to the ED with complaint of right-sided low back pain that runs down into her right leg which is constant.  Patient states that she has been diagnosed with sciatica and has responded in the past to prednisone.  Patient denies any recent injury or falls.  No complaints that are consistent or suggest cauda equina.  Patient continues to ambulate with the use of a cane which she is at her baseline.  She has used over-the-counter medications without any  improvement.  Muscle strength remains strong bilaterally and patient has right-sided SI joint tenderness to palpation.  Patient was given Decadron 10 mg IM while in the ED and a prescription for prednisone was sent to her pharmacy.  She is to follow-up with her PCP if any continued problems and return to the emergency department if any urgent concerns or severe worsening of her symptoms.   ____________________________________________   FINAL CLINICAL IMPRESSION(S) / ED DIAGNOSES  Final diagnoses:  Sciatica of right side     ED Discharge Orders         Ordered    predniSONE (DELTASONE) 10 MG tablet        12/01/19 1617          *Please note:  Karen Hill was  evaluated in Emergency Department on 12/01/2019 for the symptoms described in the history of present illness. She was evaluated in the context of the global COVID-19 pandemic, which necessitated consideration that the patient might be at risk for infection with the SARS-CoV-2 virus that causes COVID-19. Institutional protocols and algorithms that pertain to the evaluation of patients at risk for COVID-19 are in a state of rapid change based on information released by regulatory bodies including the CDC and federal and state organizations. These policies and algorithms were followed during the patient's care in the ED.  Some ED evaluations and interventions may be delayed as a result of limited staffing during and the pandemic.*   Note:  This document was prepared using Dragon voice recognition software and may include unintentional dictation errors.    Johnn Hai, PA-C 12/01/19 1651    Harvest Dark, MD 12/01/19 2221

## 2019-12-01 NOTE — Discharge Instructions (Signed)
Follow-up with your primary care provider if any continued problems or concerns.  You may also use ice or heat to your back if needed.  Begin taking prednisone beginning with 6 tablets and tapering down by 1 tablet each day until completely finished.  An injection of Decadron was given to you while in the ED which is also a steroid and should help with your sciatica.

## 2019-12-01 NOTE — ED Triage Notes (Signed)
Pt to ED via POV stating that she is having pain in the right side of her back that runs down her leg and into her ankle. Pt reports that the pain is a constant pain. Pt states that she was seen here in the past for same and was referred to a spine specialist. Pt was dx with bulging disc and bone spurs in her ankle. Pt reports that when she was here before they gave her a cortisone shot for the pain and that this seemed to help.

## 2020-03-17 ENCOUNTER — Encounter: Payer: Self-pay | Admitting: *Deleted

## 2020-05-01 ENCOUNTER — Other Ambulatory Visit: Payer: Self-pay

## 2020-05-01 ENCOUNTER — Encounter: Payer: Self-pay | Admitting: Gastroenterology

## 2020-05-01 ENCOUNTER — Ambulatory Visit: Payer: Medicaid Other | Admitting: Gastroenterology

## 2020-06-06 IMAGING — US ULTRASOUND LEFT BREAST LIMITED
1 series · 11 of 11 positions shown · non-contrast
Comparison: Previous exam(s).

CLINICAL DATA: Short-term interval follow-up of probable benign
findings in the left breast.

EXAM:
DIGITAL DIAGNOSTIC LEFT MAMMOGRAM WITH CAD AND TOMO
ULTRASOUND LEFT BREAST

[Series 1: ultrasound left breast limited · 0.03mm/px · 11 of 11 slices shown]
[im 1/11]
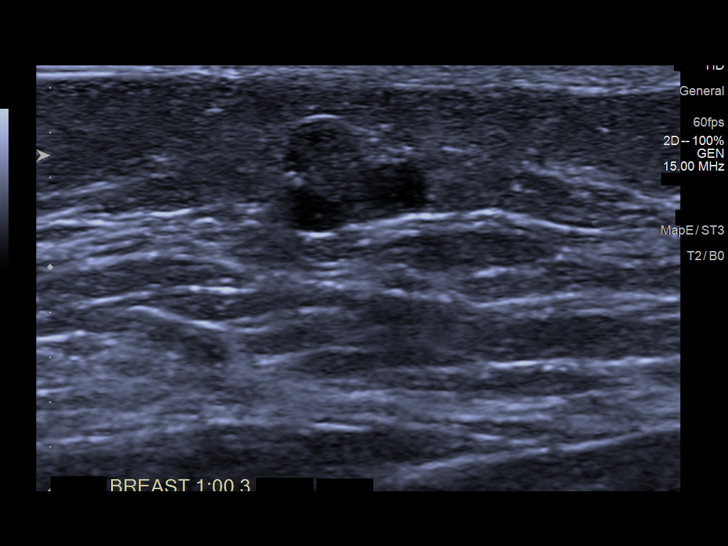
[im 2/11]
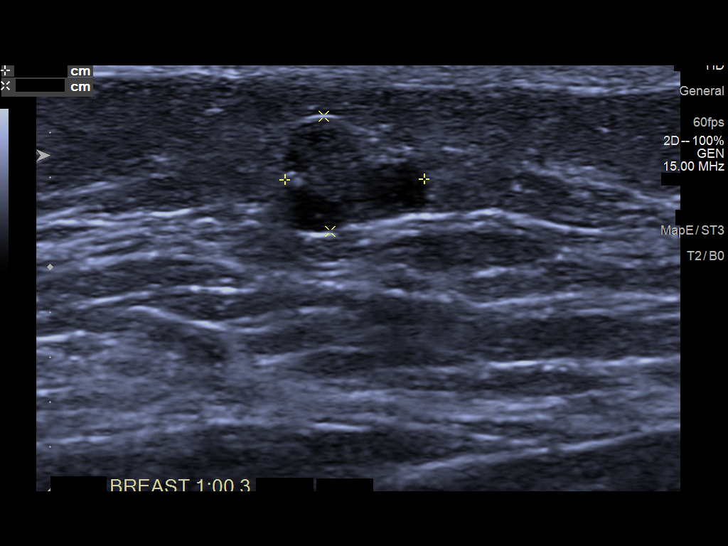
[im 3/11]
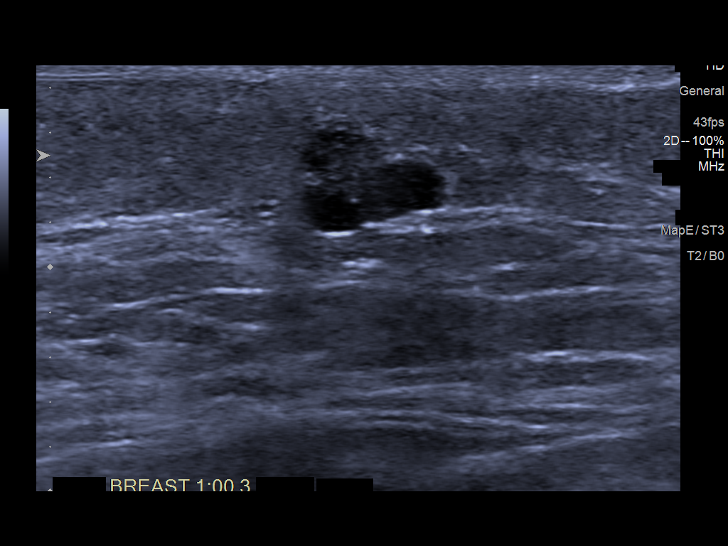
[im 4/11]
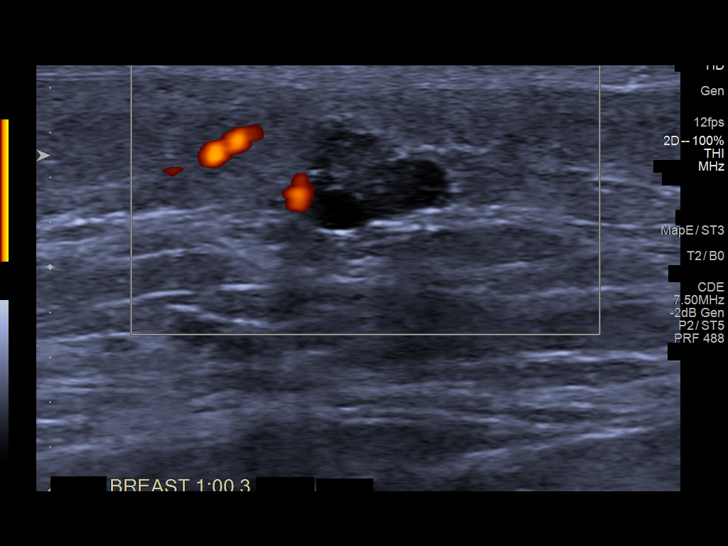
[im 5/11]
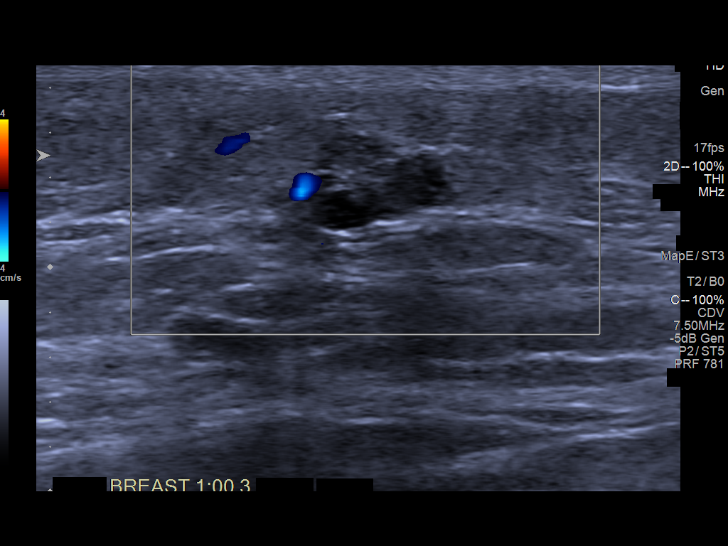
[im 6/11]
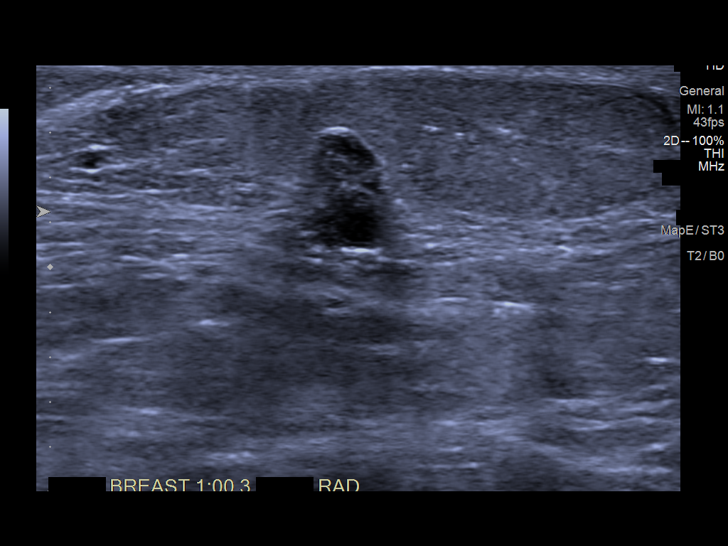
[im 7/11]
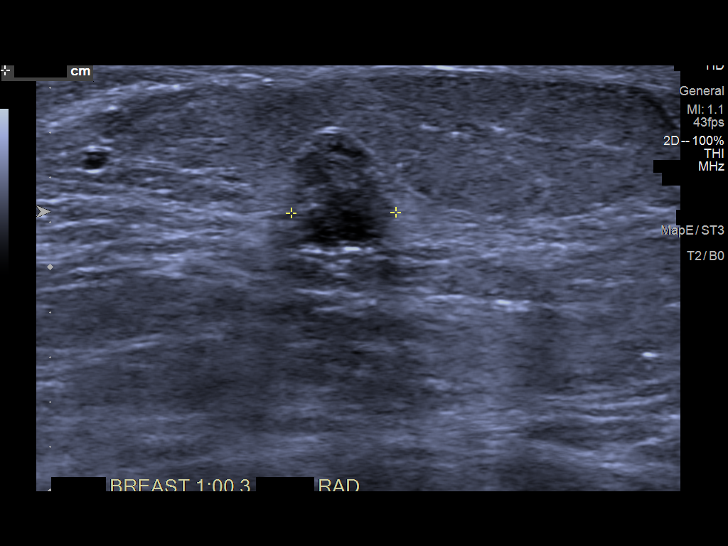
[im 8/11]
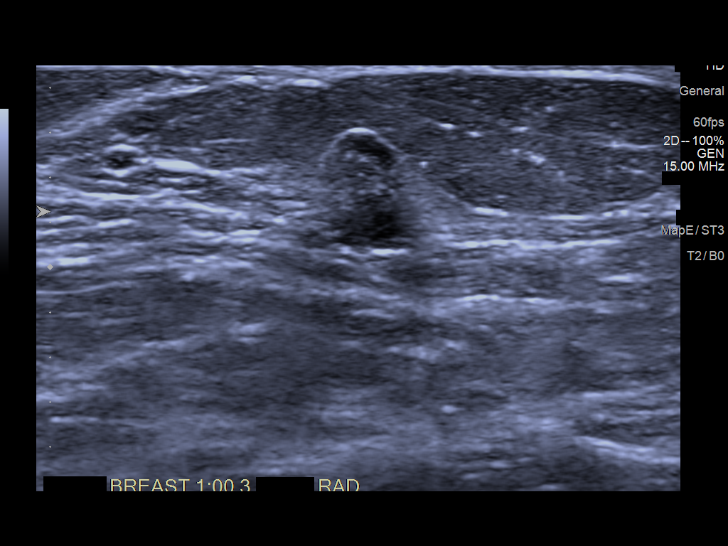
[im 9/11]
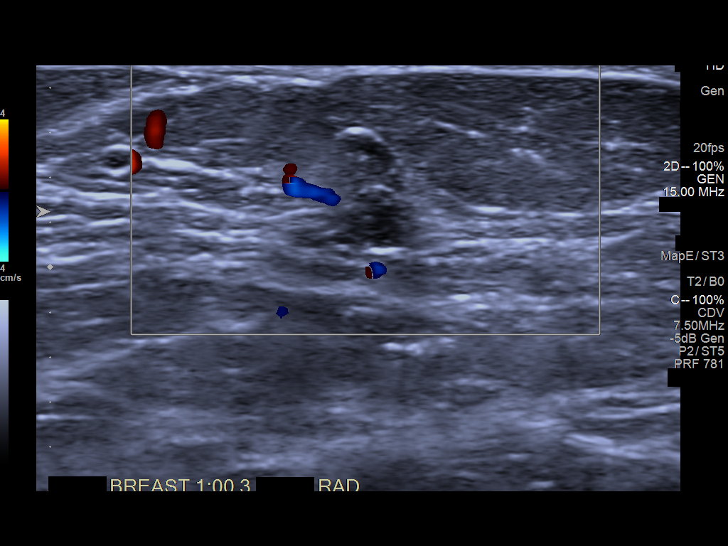
[im 10/11]
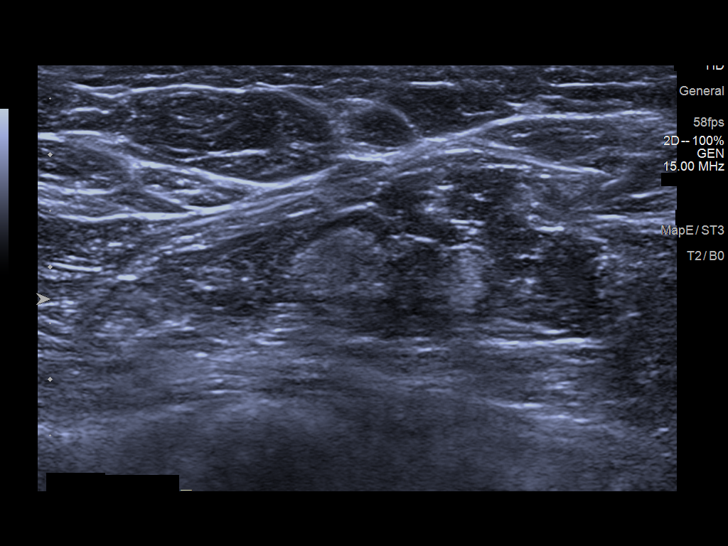
[im 11/11]
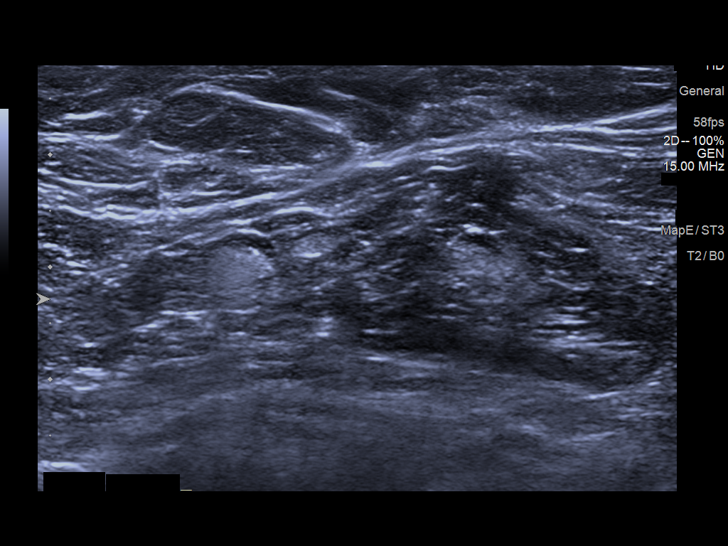

[11 of 11 positions shown; findings below may reference images not displayed]

ACR Breast Density Category b: There are scattered areas of
fibroglandular density.
FINDINGS: There is a stable 7 mm mass in the upper-outer quadrant of the left
breast. No additional masses are seen in the left breast. There is
no architectural distortion or malignant type microcalcifications.

Mammographic images were processed with CAD.

Targeted ultrasound is performed, showing there is a stable probable
benign cluster of cysts in the left breast at 1 o'clock 3 cm from
the nipple measuring 6 x 5 x 5 mm.
IMPRESSION: Stable probable benign findings in the left breast.

RECOMMENDATION:
Bilateral diagnostic mammogram and left breast ultrasound in Thursday July, 2018 is recommended.

I have discussed the findings and recommendations with the patient.
Results were also provided in writing at the conclusion of the
visit. If applicable, a reminder letter will be sent to the patient
regarding the next appointment.

BI-RADS CATEGORY  3: Probably benign.

## 2022-03-26 ENCOUNTER — Ambulatory Visit: Payer: Medicaid Other | Admitting: Internal Medicine

## 2022-03-29 ENCOUNTER — Ambulatory Visit: Payer: Medicaid Other | Admitting: Nurse Practitioner

## 2022-04-01 ENCOUNTER — Ambulatory Visit (INDEPENDENT_AMBULATORY_CARE_PROVIDER_SITE_OTHER): Payer: Medicaid Other | Admitting: Nurse Practitioner

## 2022-04-01 VITALS — BP 118/68 | HR 94 | Ht 66.0 in | Wt 265.0 lb

## 2022-04-01 DIAGNOSIS — R5383 Other fatigue: Secondary | ICD-10-CM | POA: Insufficient documentation

## 2022-04-01 DIAGNOSIS — R7303 Prediabetes: Secondary | ICD-10-CM | POA: Insufficient documentation

## 2022-04-01 DIAGNOSIS — E039 Hypothyroidism, unspecified: Secondary | ICD-10-CM | POA: Insufficient documentation

## 2022-04-01 DIAGNOSIS — J019 Acute sinusitis, unspecified: Secondary | ICD-10-CM | POA: Insufficient documentation

## 2022-04-01 MED ORDER — ALBUTEROL SULFATE HFA 108 (90 BASE) MCG/ACT IN AERS: 2.0000 | INHALATION_SPRAY | Freq: Four times a day (QID) | RESPIRATORY_TRACT | 3 refills | Status: DC | PRN

## 2022-04-01 MED ORDER — FLUTICASONE PROPIONATE 50 MCG/ACT NA SUSP: 2.0000 | Freq: Every day | NASAL | 2 refills | Status: DC

## 2022-04-01 MED ORDER — DOXYCYCLINE HYCLATE 100 MG PO TBEC: 100.0000 mg | DELAYED_RELEASE_TABLET | Freq: Two times a day (BID) | ORAL | 0 refills | Status: DC

## 2022-04-01 NOTE — Progress Notes (Signed)
Established Patient Office Visit  Subjective:  Patient ID: Karen Hill, female    DOB: [birth year]  Age: 47 y.o. MRN: [id number]  Chief Complaint  Patient presents with   Acute Visit    R Ear Pain/ Ache    Pt is here for an acute visit, reporting possible ear infection.  Patient actually had pain coming from lymphnodes on the right side.  Can't talk well and had some difficulty with swallowing.      No other concerns at this time.   Past Medical History:  Diagnosis Date   Liver hemangioma    Lymphedema    Thyroid disease     Past Surgical History:  Procedure Laterality Date   CESAREAN SECTION  2014   CHOLECYSTECTOMY  2005   MANDIBLE FRACTURE SURGERY     MULTIPLE TOOTH EXTRACTIONS     TUBAL LIGATION  2014    Social History   Socioeconomic History   Marital status: Married    Spouse name: Not on file   Number of children: Not on file   Years of education: Not on file   Highest education level: Not on file  Occupational History   Not on file  Tobacco Use   Smoking status: Every Day    Packs/day: 1.00    Years: 10.00    Additional pack years: 0.00    Total pack years: 10.00    Types: Cigarettes   Smokeless tobacco: Never  Substance and Sexual Activity   Alcohol use: Yes    Comment: rare   Drug use: No   Sexual activity: Not on file  Other Topics Concern   Not on file  Social History Narrative   Not on file   Social Determinants of Health   Financial Resource Strain: Not on file  Food Insecurity: Not on file  Transportation Needs: Not on file  Physical Activity: Not on file  Stress: Not on file  Social Connections: Not on file  Intimate Partner Violence: Not on file    Family History  Problem Relation Age of Onset   Diabetes Mother    Hypertension Father    Breast cancer Neg Hx     Allergies  Allergen Reactions   Penicillin G Hives and Itching   Amoxicillin    Shellfish Allergy Nausea And Vomiting    Review of Systems   Constitutional: Negative.   HENT:  Positive for congestion, ear discharge, ear pain, hearing loss, sinus pain, sore throat and tinnitus.   Eyes: Negative.   Respiratory:  Positive for cough, sputum production, shortness of breath and wheezing.   Cardiovascular: Negative.   Gastrointestinal:  Positive for nausea.  Genitourinary: Negative.   Musculoskeletal:  Positive for back pain, joint pain, myalgias and neck pain.  Skin: Negative.   Neurological:  Positive for dizziness and headaches.  Endo/Heme/Allergies:  Bruises/bleeds easily.  Psychiatric/Behavioral:  Positive for depression. The patient is nervous/anxious and has insomnia.        Objective:   BP 118/68   Pulse 94   Ht 5\' 6"  (1.676 m)   Wt 265 lb (120.2 kg)   SpO2 98%   BMI 42.77 kg/m   Vitals:   04/01/22 0914  BP: 118/68  Pulse: 94  Height: 5\' 6"  (1.676 m)  Weight: 265 lb (120.2 kg)  SpO2: 98%  BMI (Calculated): 42.79    Physical Exam Vitals reviewed.  Constitutional:      Appearance: Normal appearance.  HENT:     Head: Normocephalic.  Nose: Nose normal.     Mouth/Throat:     Mouth: Mucous membranes are moist.  Eyes:     Pupils: Pupils are equal, round, and reactive to light.  Cardiovascular:     Rate and Rhythm: Normal rate and regular rhythm.  Pulmonary:     Effort: Pulmonary effort is normal.     Breath sounds: Normal breath sounds.  Abdominal:     General: Bowel sounds are normal.     Palpations: Abdomen is soft.  Musculoskeletal:        General: Normal range of motion.     Cervical back: Normal range of motion and neck supple.  Skin:    General: Skin is warm and dry.  Neurological:     Mental Status: She is alert and oriented to person, place, and time.  Psychiatric:        Mood and Affect: Mood normal.        Behavior: Behavior normal.      No results found for any visits on 04/01/22.  No results found for this or any previous visit (from the past 2160 hour(s)).    Assessment  & Plan:   Problem List Items Addressed This Visit       Respiratory   Acute sinusitis   Relevant Medications   doxycycline (DORYX) 100 MG EC tablet   fluticasone (FLONASE) 50 MCG/ACT nasal spray     Endocrine   Hypothyroidism, adult - Primary   Relevant Orders   TSH     Other   Prediabetes   Relevant Orders   CMP14+EGFR   Hemoglobin A1c   Other fatigue   Relevant Orders   CBC    Return in about 6 months (around 10/02/2022).   Total time spent: 35 minutes  Evern Bio, NP  04/01/2022

## 2022-04-01 NOTE — Patient Instructions (Signed)
1) [place] 2) Fasting labs today 3) My Chart message for lab results

## 2022-04-02 LAB — CMP14+EGFR
ALT: 6 IU/L (ref 0–32)
AST: 11 IU/L (ref 0–40)
Albumin/Globulin Ratio: 1.3 (ref 1.2–2.2)
Albumin: 4 g/dL (ref 3.9–4.9)
Alkaline Phosphatase: 103 IU/L (ref 44–121)
BUN/Creatinine Ratio: 8 — ABNORMAL LOW (ref 9–23)
BUN: 9 mg/dL (ref 6–24)
Bilirubin Total: 0.4 mg/dL (ref 0.0–1.2)
CO2: 26 mmol/L (ref 20–29)
Calcium: 9.2 mg/dL (ref 8.7–10.2)
Chloride: 100 mmol/L (ref 96–106)
Creatinine, Ser: 1.11 mg/dL — ABNORMAL HIGH (ref 0.57–1.00)
Globulin, Total: 3.2 g/dL (ref 1.5–4.5)
Glucose: 79 mg/dL (ref 70–99)
Potassium: 4.8 mmol/L (ref 3.5–5.2)
Sodium: 137 mmol/L (ref 134–144)
Total Protein: 7.2 g/dL (ref 6.0–8.5)
eGFR: 62 mL/min/{1.73_m2} (ref >59)

## 2022-04-02 LAB — CBC
Hematocrit: 36.4 % (ref 34.0–46.6)
Hemoglobin: 11.8 g/dL (ref 11.1–15.9)
MCH: 31.6 pg (ref 26.6–33.0)
MCHC: 32.4 g/dL (ref 31.5–35.7)
MCV: 98 fL — ABNORMAL HIGH (ref 79–97)
Platelets: 279 10*3/uL (ref 150–450)
RBC: 3.73 x10E6/uL — ABNORMAL LOW (ref 3.77–5.28)
RDW: 15.4 % (ref 11.7–15.4)
WBC: 8.6 10*3/uL (ref 3.4–10.8)

## 2022-04-02 LAB — HEMOGLOBIN A1C
Est. average glucose Bld gHb Est-mCnc: 100 mg/dL
Hgb A1c MFr Bld: 5.1 % (ref 4.8–5.6)

## 2022-04-02 LAB — TSH: TSH: 261 u[IU]/mL — ABNORMAL HIGH (ref 0.450–4.500)

## 2022-04-06 ENCOUNTER — Other Ambulatory Visit: Payer: Self-pay | Admitting: Nurse Practitioner

## 2022-04-06 DIAGNOSIS — E039 Hypothyroidism, unspecified: Secondary | ICD-10-CM

## 2022-04-06 MED ORDER — LEVOTHYROXINE SODIUM 125 MCG PO TABS: 125.0000 ug | ORAL_TABLET | Freq: Every day | ORAL | 1 refills | Status: DC

## 2022-05-03 ENCOUNTER — Other Ambulatory Visit: Payer: Self-pay | Admitting: Nurse Practitioner

## 2022-05-03 DIAGNOSIS — J019 Acute sinusitis, unspecified: Secondary | ICD-10-CM

## 2022-05-18 ENCOUNTER — Encounter: Payer: Self-pay | Admitting: Nurse Practitioner

## 2022-05-18 ENCOUNTER — Other Ambulatory Visit: Payer: Medicaid Other

## 2022-05-18 DIAGNOSIS — R7303 Prediabetes: Secondary | ICD-10-CM

## 2022-05-18 DIAGNOSIS — E039 Hypothyroidism, unspecified: Secondary | ICD-10-CM

## 2022-05-18 DIAGNOSIS — R5383 Other fatigue: Secondary | ICD-10-CM

## 2022-05-18 DIAGNOSIS — I10 Essential (primary) hypertension: Secondary | ICD-10-CM

## 2022-05-19 LAB — CMP14+EGFR
ALT: 8 IU/L (ref 0–32)
AST: 9 IU/L (ref 0–40)
Albumin/Globulin Ratio: 1.3 (ref 1.2–2.2)
Albumin: 3.3 g/dL — ABNORMAL LOW (ref 3.9–4.9)
Alkaline Phosphatase: 98 IU/L (ref 44–121)
BUN/Creatinine Ratio: 7 — ABNORMAL LOW (ref 9–23)
BUN: 6 mg/dL (ref 6–24)
Bilirubin Total: 0.6 mg/dL (ref 0.0–1.2)
CO2: 25 mmol/L (ref 20–29)
Calcium: 9 mg/dL (ref 8.7–10.2)
Chloride: 105 mmol/L (ref 96–106)
Creatinine, Ser: 0.81 mg/dL (ref 0.57–1.00)
Globulin, Total: 2.6 g/dL (ref 1.5–4.5)
Glucose: 87 mg/dL (ref 70–99)
Potassium: 4.1 mmol/L (ref 3.5–5.2)
Sodium: 142 mmol/L (ref 134–144)
Total Protein: 5.9 g/dL — ABNORMAL LOW (ref 6.0–8.5)
eGFR: 91 mL/min/{1.73_m2} (ref >59)

## 2022-05-19 LAB — LIPID PANEL
Chol/HDL Ratio: 5 ratio — ABNORMAL HIGH (ref 0.0–4.4)
Cholesterol, Total: 151 mg/dL (ref 100–199)
HDL: 30 mg/dL — ABNORMAL LOW (ref >39)
LDL Chol Calc (NIH): 98 mg/dL (ref 0–99)
Triglycerides: 128 mg/dL (ref 0–149)
VLDL Cholesterol Cal: 23 mg/dL (ref 5–40)

## 2022-05-19 LAB — CBC WITH DIFFERENTIAL
Basophils Absolute: 0 10*3/uL (ref 0.0–0.2)
Basos: 0 %
EOS (ABSOLUTE): 0.1 10*3/uL (ref 0.0–0.4)
Eos: 2 %
Hematocrit: 31.8 % — ABNORMAL LOW (ref 34.0–46.6)
Hemoglobin: 10.1 g/dL — ABNORMAL LOW (ref 11.1–15.9)
Immature Grans (Abs): 0 10*3/uL (ref 0.0–0.1)
Immature Granulocytes: 0 %
Lymphocytes Absolute: 1.5 10*3/uL (ref 0.7–3.1)
Lymphs: 26 %
MCH: 31.9 pg (ref 26.6–33.0)
MCHC: 31.8 g/dL (ref 31.5–35.7)
MCV: 100 fL — ABNORMAL HIGH (ref 79–97)
Monocytes Absolute: 0.3 10*3/uL (ref 0.1–0.9)
Monocytes: 5 %
Neutrophils Absolute: 4 10*3/uL (ref 1.4–7.0)
Neutrophils: 67 %
RBC: 3.17 x10E6/uL — ABNORMAL LOW (ref 3.77–5.28)
RDW: 15.1 % (ref 11.7–15.4)
WBC: 5.9 10*3/uL (ref 3.4–10.8)

## 2022-05-19 LAB — TSH: TSH: 3.75 u[IU]/mL (ref 0.450–4.500)

## 2022-05-19 LAB — HEMOGLOBIN A1C
Est. average glucose Bld gHb Est-mCnc: 97 mg/dL
Hgb A1c MFr Bld: 5 % (ref 4.8–5.6)

## 2022-05-28 ENCOUNTER — Other Ambulatory Visit: Payer: Self-pay | Admitting: Nurse Practitioner

## 2022-06-11 ENCOUNTER — Ambulatory Visit: Payer: Medicaid Other | Admitting: Nurse Practitioner

## 2022-06-21 ENCOUNTER — Ambulatory Visit: Payer: Medicaid Other | Admitting: Nurse Practitioner

## 2022-06-28 ENCOUNTER — Ambulatory Visit: Payer: Medicaid Other | Admitting: Nurse Practitioner

## 2022-08-28 ENCOUNTER — Other Ambulatory Visit: Payer: Self-pay | Admitting: Internal Medicine

## 2022-09-22 ENCOUNTER — Other Ambulatory Visit: Payer: Self-pay | Admitting: Internal Medicine

## 2022-10-12 ENCOUNTER — Other Ambulatory Visit: Payer: Self-pay | Admitting: Cardiology

## 2022-10-12 DIAGNOSIS — E039 Hypothyroidism, unspecified: Secondary | ICD-10-CM

## 2022-10-12 MED ORDER — LEVOTHYROXINE SODIUM 125 MCG PO TABS
125.0000 ug | ORAL_TABLET | Freq: Every day | ORAL | 1 refills | Status: DC
Start: 2022-10-12 — End: 2023-02-01

## 2022-10-18 ENCOUNTER — Ambulatory Visit: Payer: Medicaid Other | Admitting: Family

## 2022-10-18 VITALS — BP 130/85 | HR 86 | Ht 66.0 in | Wt 292.0 lb

## 2022-10-18 DIAGNOSIS — J019 Acute sinusitis, unspecified: Secondary | ICD-10-CM

## 2022-10-18 DIAGNOSIS — K469 Unspecified abdominal hernia without obstruction or gangrene: Secondary | ICD-10-CM

## 2022-10-18 DIAGNOSIS — B029 Zoster without complications: Secondary | ICD-10-CM

## 2022-10-18 DIAGNOSIS — I890 Lymphedema, not elsewhere classified: Secondary | ICD-10-CM | POA: Diagnosis not present

## 2022-10-18 DIAGNOSIS — Z0130 Encounter for examination of blood pressure without abnormal findings: Secondary | ICD-10-CM

## 2022-10-18 MED ORDER — SEMAGLUTIDE-WEIGHT MANAGEMENT 0.5 MG/0.5ML ~~LOC~~ SOAJ: 0.5000 mg | SUBCUTANEOUS | 0 refills | Status: AC

## 2022-10-18 MED ORDER — SEMAGLUTIDE-WEIGHT MANAGEMENT 2.4 MG/0.75ML ~~LOC~~ SOAJ: 2.4000 mg | SUBCUTANEOUS | 0 refills | Status: DC

## 2022-10-18 MED ORDER — SEMAGLUTIDE-WEIGHT MANAGEMENT 1.7 MG/0.75ML ~~LOC~~ SOAJ: 1.7000 mg | SUBCUTANEOUS | 0 refills | Status: AC

## 2022-10-18 MED ORDER — SEMAGLUTIDE-WEIGHT MANAGEMENT 0.25 MG/0.5ML ~~LOC~~ SOAJ: 0.2500 mg | SUBCUTANEOUS | 0 refills | Status: AC

## 2022-10-18 MED ORDER — DOXYCYCLINE HYCLATE 100 MG PO CAPS
100.0000 mg | ORAL_CAPSULE | Freq: Two times a day (BID) | ORAL | 0 refills | Status: DC
Start: 2022-10-18 — End: 2023-02-01

## 2022-10-18 MED ORDER — SEMAGLUTIDE-WEIGHT MANAGEMENT 1 MG/0.5ML ~~LOC~~ SOAJ: 1.0000 mg | SUBCUTANEOUS | 0 refills | Status: AC

## 2022-10-18 MED ORDER — VALACYCLOVIR HCL 1 G PO TABS: 2000.0000 mg | ORAL_TABLET | Freq: Two times a day (BID) | ORAL | 0 refills | Status: AC

## 2022-10-18 NOTE — Progress Notes (Signed)
Established Patient Office Visit  Subjective:  Patient ID: Karen Hill, female    DOB: 1975/10/14  Age: 47 y.o. MRN: [id number]  Chief Complaint  Patient presents with   Follow-up    Possible sinus infection    Patient is here today for her 2 months follow up.  She has been feeling well since last appointment.   She does have additional concerns to discuss today.   1) She thinks she may have a Sinus infection.  She says she normally has 1-2 of these a year usually around the change of seasons because it activates her allergies.  She has been having significant sinus pressure and pain for around a week and asks if we can send antibiotics.  2) patient has significant lymphedema in both legs.  She has had this for quite some time and asks if there is anything we can do to help but she is starting to have trouble walking safely. 3) she has a rash on her back that she is concerned might be shingles.  She says that it is quite painful and has been there for a few days. 4) she has a known hernia in her abdomen and asks if we can set her up with GI to help get this assessed or with general surgery as she thinks it has started to get worse and is now causing her some discomfort. Labs are due today. She needs refills.   I have reviewed her active problem list, medication list, allergies, notes from last encounter, lab results for her appointment today.      No other concerns at this time.   Past Medical History:  Diagnosis Date   Liver hemangioma    Lymphedema    Thyroid disease     Past Surgical History:  Procedure Laterality Date   CESAREAN SECTION  2014   CHOLECYSTECTOMY  2005   MANDIBLE FRACTURE SURGERY     MULTIPLE TOOTH EXTRACTIONS     TUBAL LIGATION  2014    Social History   Socioeconomic History   Marital status: Married    Spouse name: Not on file   Number of children: Not on file   Years of education: Not on file   Highest education level: Not on file   Occupational History   Not on file  Tobacco Use   Smoking status: Every Day    Current packs/day: 1.00    Average packs/day: 1 pack/day for 10.0 years (10.0 ttl pk-yrs)    Types: Cigarettes   Smokeless tobacco: Never  Substance and Sexual Activity   Alcohol use: Yes    Comment: rare   Drug use: No   Sexual activity: Not on file  Other Topics Concern   Not on file  Social History Narrative   Not on file   Social Determinants of Health   Financial Resource Strain: Not on file  Food Insecurity: Not on file  Transportation Needs: Not on file  Physical Activity: Not on file  Stress: Not on file  Social Connections: Not on file  Intimate Partner Violence: Not on file    Family History  Problem Relation Age of Onset   Diabetes Mother    Hypertension Father    Breast cancer Neg Hx     Allergies  Allergen Reactions   Penicillin G Hives and Itching   Amoxicillin    Shellfish Allergy Nausea And Vomiting    Review of Systems  HENT:  Positive for congestion and sinus pain.  Cardiovascular:  Positive for leg swelling.  Gastrointestinal:  Positive for abdominal pain.  Skin:  Positive for itching and rash.  All other systems reviewed and are negative.      Objective:   BP 130/85   Pulse 86   Ht 5\' 6"  (1.676 m)   Wt 292 lb (132.5 kg)   SpO2 97%   BMI 47.13 kg/m   Vitals:   10/18/22 1113  BP: 130/85  Pulse: 86  Height: 5\' 6"  (1.676 m)  Weight: 292 lb (132.5 kg)  SpO2: 97%  BMI (Calculated): 47.15    Physical Exam Vitals and nursing note reviewed.  Constitutional:      General: She is awake.     Appearance: Normal appearance. She is well-developed. She is obese.  HENT:     Head: Normocephalic.     Nose: Congestion and rhinorrhea present.  Eyes:     Extraocular Movements: Extraocular movements intact.     Conjunctiva/sclera: Conjunctivae normal.     Pupils: Pupils are equal, round, and reactive to light.  Cardiovascular:     Rate and Rhythm: Normal  rate.  Pulmonary:     Effort: Pulmonary effort is normal.  Abdominal:     Hernia: A hernia (Periumbilical) is present.  Musculoskeletal:     Right lower leg: Edema present.     Left lower leg: Edema present.  Skin:      Neurological:     General: No focal deficit present.     Mental Status: She is alert and oriented to person, place, and time. Mental status is at baseline.  Psychiatric:        Mood and Affect: Mood normal.        Behavior: Behavior normal. Behavior is cooperative.        Thought Content: Thought content normal.        Judgment: Judgment normal.      No results found for any visits on 10/18/22.  No results found for this or any previous visit (from the past 2160 hour(s)).     Assessment & Plan:   Problem List Items Addressed This Visit       Active Problems   Acute sinusitis    Abx and prednisone sent for pt.  She will let us know if this does not improve.       Relevant Medications   doxycycline (VIBRAMYCIN) 100 MG capsule   Abdominal hernia without obstruction and without gangrene    Sending referral for patient to GI to evaluate the hernia and see if anything needs to be done to resolve this.  Will defer to them for any further treatment decisions.       Relevant Orders   Ambulatory referral to Gastroenterology   Lymphedema of both lower extremities    Will send referral to lymphedema clinic for pt so they can recommend treatment.   Deferring to them for any changes to plan.  Reassess at follow up.        Relevant Orders   Ambulatory referral to Occupational Therapy   Other Visit Diagnoses     Herpes zoster without complication    -  Primary   Sending patient valtrex RX for her to take.  Will let me know if she is not improving.       Return in about 1 month (around 11/18/2022) for F/U.   Total time spent: 30 minutes  Miki Kins, FNP  10/18/2022   This document may have been prepared by  Manufacturing engineer and as such may include unintentional dictation errors.

## 2022-10-19 ENCOUNTER — Encounter: Payer: Self-pay | Admitting: Family

## 2022-11-14 ENCOUNTER — Encounter: Payer: Self-pay | Admitting: Family

## 2022-11-14 DIAGNOSIS — K469 Unspecified abdominal hernia without obstruction or gangrene: Secondary | ICD-10-CM | POA: Insufficient documentation

## 2022-11-14 DIAGNOSIS — I890 Lymphedema, not elsewhere classified: Secondary | ICD-10-CM | POA: Insufficient documentation

## 2022-11-14 NOTE — Assessment & Plan Note (Signed)
Sending referral for patient to GI to evaluate the hernia and see if anything needs to be done to resolve this.  Will defer to them for any further treatment decisions.

## 2022-11-14 NOTE — Assessment & Plan Note (Signed)
Abx and prednisone sent for pt.  She will let us know if this does not improve.

## 2022-11-14 NOTE — Assessment & Plan Note (Signed)
Will send referral to lymphedema clinic for pt so they can recommend treatment.   Deferring to them for any changes to plan.  Reassess at follow up.

## 2022-11-18 ENCOUNTER — Other Ambulatory Visit: Payer: Self-pay | Admitting: Family

## 2022-11-25 ENCOUNTER — Telehealth: Payer: Self-pay | Admitting: Family

## 2022-11-25 NOTE — Telephone Encounter (Signed)
Needs [facility] referral. Was supposed to be sent at last visit. Requesting [facility].

## 2022-11-26 ENCOUNTER — Ambulatory Visit: Payer: Medicaid Other | Admitting: Family

## 2022-11-30 ENCOUNTER — Encounter: Payer: Self-pay | Admitting: Family

## 2022-12-26 ENCOUNTER — Encounter: Payer: Self-pay | Admitting: Family

## 2023-01-31 NOTE — Progress Notes (Unsigned)
Karen Amy, PA-C [address]  [address]  [place], [place] [address]  Main: [phone]  Fax: [phone]   Gastroenterology Consultation  Referring Provider:     Miki Kins, FNP Primary Care Physician:  Miki Kins, FNP Primary Gastroenterologist:  Karen Amy, PA-C  Reason for Consultation:     Abdominal wall hernia        HPI:   Karen Hill is a 48 y.o. y/o female referred for consultation & management  by Miki Kins, FNP.    Patient states she has had a ventral hernia since 2013.  It is becoming larger.  It hurts to cough.  It is protruding from her middle lower abdomen.  She is able to push it back in.  She has noticed some purple blotchy skin discoloration over the hernia.  It is becoming more tender and she admits to diffuse intermittent lower abdominal pain.  She denies nausea or vomiting.  She saw Dr. Everlene Farrier, general surgeon, for evaluation of Ventral Hernia 02/2018.  She had reducible ventral hernia in the mid lower abdomen with NO obstruction.  Weight loss was advised before surgery could be performed.  Since 2020 she has gained 30 pounds from 232 to 261 lb.  [id number].  Current smoker.  She was recently started on Wegovy to help with weight loss with benefit.  Patient has noticed increased constipation since starting Wegovy.  She was constipated previously.  No current treatment.  Takes occasional MiraLAX as needed.  Currently having a bowel movement every 2 or 3 days with hard stools and straining.  Denies rectal bleeding.  No previous Colonoscopy.  She is due for first screening colonoscopy due to age.  She had an EGD by Dr. Bluford Kaufmann at [facility] 11/2013, results unavailable.  Past Medical History:  Diagnosis Date   Liver hemangioma    Lymphedema    Thyroid disease     Past Surgical History:  Procedure Laterality Date   CESAREAN SECTION  2014   CHOLECYSTECTOMY  2005   MANDIBLE FRACTURE SURGERY     MULTIPLE TOOTH EXTRACTIONS      TUBAL LIGATION  2014    Prior to Admission medications   Medication Sig Start Date End Date Taking? Authorizing Provider  albuterol (PROAIR HFA) 108 (90 Base) MCG/ACT inhaler Inhale 2 puffs into the lungs every 6 (six) hours as needed for wheezing or shortness of breath. 04/01/22   Orson Eva, NP  APPLE CIDER VINEGAR PO Take by mouth daily. Patient not taking: Reported on 04/01/2022    [provider]  baclofen (LIORESAL) 10 MG tablet TAKE 2 TABLETS IN MORNING AND 2 TABLETS NIGHTLY AS NEEDED FOR MUSCLE RELAXANT-WILL MAKE DROWSY 08/30/22   Margaretann Loveless, MD  doxycycline (VIBRAMYCIN) 100 MG capsule Take 1 capsule (100 mg total) by mouth 2 (two) times daily. 10/18/22   Miki Kins, FNP  escitalopram (LEXAPRO) 10 MG tablet Take 1 tablet by mouth daily. Patient not taking: Reported on 04/01/2022 03/19/20   [provider]  escitalopram (LEXAPRO) 20 MG tablet Take 20 mg by mouth daily. 12/25/21   [provider]  fluticasone (FLONASE) 50 MCG/ACT nasal spray SPRAY 2 SPRAYS INTO EACH NOSTRIL EVERY DAY 05/03/22   Orson Eva, NP  gabapentin (NEURONTIN) 300 MG capsule TAKE 1 CAPSULE BY MOUTH NIGHTLY AT BEDTIME FOR 7 DAYS, INCREASE TO 2 CAPS AT BEDTIME FOR NEUROPATHY, APPT TIME AND LABS PLEASE 05/31/22   Margaretann Loveless, MD  levothyroxine (  SYNTHROID) 125 MCG tablet Take 1 tablet (125 mcg total) by mouth daily before breakfast. 10/12/22   Scoggins, Hospital doctor, NP  predniSONE (DELTASONE) 10 MG tablet Take 6 tablets  today, on day 2 take 5 tablets, day 3 take 4 tablets, day 4 take 3 tablets, day 5 take  2 tablets and 1 tablet the last day Patient not taking: Reported on 04/01/2022 12/01/19   Tommi Rumps, PA-C  Semaglutide-Weight Management 1.7 MG/0.75ML SOAJ Inject 1.7 mg into the skin once a week for 28 days. 01/13/23 02/10/23  Miki Kins, FNP  Semaglutide-Weight Management 2.4 MG/0.75ML SOAJ Inject 2.4 mg into the skin once a week for 28 days. 02/11/23 03/11/23  Miki Kins, FNP  SYMBICORT 80-4.5 MCG/ACT inhaler INHALE 1 PUFF BY MOUTH TWICE DAILY, ONE IN AM AND ONE IN PM (MAINTENANCE INHALER) Patient not taking: Reported on 04/01/2022 10/28/17   [provider]  Vitamin D, Ergocalciferol, (DRISDOL) 1.25 MG (50000 UT) CAPS capsule TAKE ONE CAPSULE BY MOUTH ONCE A WEEK FOR LOW VIT D Patient not taking: Reported on 04/01/2022 10/14/17   [provider]    Family History  Problem Relation Age of Onset   Diabetes Mother    Hypertension Father    Breast cancer Neg Hx      Social History   Tobacco Use   Smoking status: Every Day    Current packs/day: 1.00    Average packs/day: 1 pack/day for 10.0 years (10.0 ttl pk-yrs)    Types: Cigarettes   Smokeless tobacco: Never  Substance Use Topics   Alcohol use: Yes    Comment: rare   Drug use: No    Allergies as of 02/01/2023 - Review Complete 02/01/2023  Allergen Reaction Noted   Penicillin g Hives and Itching 11/08/2013   Amoxicillin  04/01/2022   Shellfish allergy Nausea And Vomiting 03/12/2016    Review of Systems:    All systems reviewed and negative except where noted in HPI.   Physical Exam:  BP 113/75   Pulse (!) 114   Temp 97.8 F (36.6 C)   Ht 5\' 9"  (1.753 m)   Wt 261 lb 12.8 oz (118.8 kg)   LMP 12/26/2022   BMI 38.66 kg/m  Patient's last menstrual period was 12/26/2022.  General:   Alert,  Well-developed, obese, well-nourished, pleasant and cooperative in NAD Lungs:  Respirations even and unlabored.  Clear throughout to auscultation.   No wheezes, crackles, or rhonchi. No acute distress. Heart:  Regular rate and rhythm; no murmurs, clicks, rubs, or gallops. Abdomen:  Normal bowel sounds.  No bruits.  Soft, and very obese.  There is a large ventral hernia over the lower mid abdomen below the umbilicus.  There is some mild blotchy purple skin discoloration.  The hernia is reducible when she lays supine, however the area is moderately tender.  No masses, guarding or  rebound tenderness.    Neurologic:  Alert and oriented x3;  grossly normal neurologically. Psych:  Alert and cooperative. Normal mood and affect.  Imaging Studies: No results found.  Assessment and Plan:   [name] is a 48 y.o. y/o female has been referred for   Large lower abdominal ventral Hernia; tender and reducible  Ordering abdominal pelvic CT with contrast to further evaluate.  Will refer back to general surgeon if there are worrisome findings of the hernia on CT.  Generalized lower abdominal pain Labs CBC, CMP, hCG.  Constipation /history of hypothyroidism /currently on levothyroxine 125 mcg  daily. Lab TSH Rx Amitiza 24 mcg 1 tablet twice daily with food, #60, 2 refills.  Obesity Continue weight loss efforts.  Colon Cancer Screening Follow-up office visit in 4 weeks to discuss timing of colonoscopy after she has had lab work and a CT scan.  Follow up in 4 weeks with TG.  Karen Amy, PA-C

## 2023-02-01 ENCOUNTER — Encounter: Payer: Self-pay | Admitting: Physician Assistant

## 2023-02-01 ENCOUNTER — Ambulatory Visit
Admission: RE | Admit: 2023-02-01 | Discharge: 2023-02-01 | Disposition: A | Discharge status: Home / Self Care | Payer: Medicaid Other | Attending: Family | Admitting: Family

## 2023-02-01 ENCOUNTER — Ambulatory Visit
Admission: RE | Admit: 2023-02-01 | Discharge: 2023-02-01 | Disposition: A | Discharge status: Home / Self Care | Payer: Medicaid Other | Source: Ambulatory Visit | Attending: Family | Admitting: Family

## 2023-02-01 ENCOUNTER — Ambulatory Visit (INDEPENDENT_AMBULATORY_CARE_PROVIDER_SITE_OTHER): Payer: Medicaid Other | Admitting: Physician Assistant

## 2023-02-01 ENCOUNTER — Encounter: Payer: Self-pay | Admitting: Family

## 2023-02-01 ENCOUNTER — Ambulatory Visit: Payer: Medicaid Other | Admitting: Family

## 2023-02-01 VITALS — BP 113/75 | HR 114 | Temp 97.8°F | Ht 69.0 in | Wt 261.8 lb

## 2023-02-01 VITALS — BP 104/70 | HR 107 | Ht 66.0 in | Wt 262.0 lb

## 2023-02-01 DIAGNOSIS — E039 Hypothyroidism, unspecified: Secondary | ICD-10-CM

## 2023-02-01 DIAGNOSIS — K439 Ventral hernia without obstruction or gangrene: Secondary | ICD-10-CM | POA: Diagnosis not present

## 2023-02-01 DIAGNOSIS — M533 Sacrococcygeal disorders, not elsewhere classified: Secondary | ICD-10-CM | POA: Insufficient documentation

## 2023-02-01 DIAGNOSIS — E669 Obesity, unspecified: Secondary | ICD-10-CM | POA: Diagnosis not present

## 2023-02-01 DIAGNOSIS — R1084 Generalized abdominal pain: Secondary | ICD-10-CM | POA: Diagnosis not present

## 2023-02-01 DIAGNOSIS — K5900 Constipation, unspecified: Secondary | ICD-10-CM

## 2023-02-01 DIAGNOSIS — Z1211 Encounter for screening for malignant neoplasm of colon: Secondary | ICD-10-CM

## 2023-02-01 DIAGNOSIS — R635 Abnormal weight gain: Secondary | ICD-10-CM

## 2023-02-01 DIAGNOSIS — K5904 Chronic idiopathic constipation: Secondary | ICD-10-CM

## 2023-02-01 DIAGNOSIS — Z0130 Encounter for examination of blood pressure without abnormal findings: Secondary | ICD-10-CM

## 2023-02-01 DIAGNOSIS — Z6838 Body mass index (BMI) 38.0-38.9, adult: Secondary | ICD-10-CM

## 2023-02-01 DIAGNOSIS — M5431 Sciatica, right side: Secondary | ICD-10-CM

## 2023-02-01 MED ORDER — PREDNISONE 20 MG PO TABS: 40.0000 mg | ORAL_TABLET | Freq: Every day | ORAL | 0 refills | Status: DC

## 2023-02-01 MED ORDER — BACLOFEN 10 MG PO TABS: 10.0000 mg | ORAL_TABLET | Freq: Two times a day (BID) | ORAL | 0 refills | Status: DC | PRN

## 2023-02-01 MED ORDER — LUBIPROSTONE 24 MCG PO CAPS
24.0000 ug | ORAL_CAPSULE | Freq: Two times a day (BID) | ORAL | 5 refills | Status: DC
Start: 2023-02-01 — End: 2023-02-22

## 2023-02-01 NOTE — Patient Instructions (Signed)
CT scheduled 02/07/23 @ [facility] arrive at 7:45 am.

## 2023-02-02 ENCOUNTER — Encounter: Payer: Self-pay | Admitting: Physician Assistant

## 2023-02-02 LAB — COMPREHENSIVE METABOLIC PANEL
ALT: 9 [IU]/L (ref 0–32)
AST: 17 [IU]/L (ref 0–40)
Albumin: 4 g/dL (ref 3.9–4.9)
Alkaline Phosphatase: 109 [IU]/L (ref 44–121)
BUN/Creatinine Ratio: 9 (ref 9–23)
BUN: 8 mg/dL (ref 6–24)
Bilirubin Total: 0.5 mg/dL (ref 0.0–1.2)
CO2: 23 mmol/L (ref 20–29)
Calcium: 9.6 mg/dL (ref 8.7–10.2)
Chloride: 102 mmol/L (ref 96–106)
Creatinine, Ser: 0.86 mg/dL (ref 0.57–1.00)
Globulin, Total: 3 g/dL (ref 1.5–4.5)
Glucose: 97 mg/dL (ref 70–99)
Potassium: 4.5 mmol/L (ref 3.5–5.2)
Sodium: 141 mmol/L (ref 134–144)
Total Protein: 7 g/dL (ref 6.0–8.5)
eGFR: 85 mL/min/{1.73_m2} (ref >59)

## 2023-02-02 LAB — TSH+FREE T4
Free T4: 1.27 ng/dL (ref 0.82–1.77)
TSH: 0.124 u[IU]/mL — ABNORMAL LOW (ref 0.450–4.500)

## 2023-02-02 LAB — HCG, SERUM, QUALITATIVE: hCG,Beta Subunit,Qual,Serum: NEGATIVE m[IU]/mL (ref <6)

## 2023-02-07 ENCOUNTER — Ambulatory Visit
Admission: RE | Admit: 2023-02-07 | Discharge: 2023-02-07 | Disposition: A | Discharge status: Home / Self Care | Payer: Medicaid Other | Source: Ambulatory Visit | Attending: Physician Assistant | Admitting: Physician Assistant

## 2023-02-07 DIAGNOSIS — K439 Ventral hernia without obstruction or gangrene: Secondary | ICD-10-CM | POA: Diagnosis present

## 2023-02-07 DIAGNOSIS — R1084 Generalized abdominal pain: Secondary | ICD-10-CM | POA: Diagnosis present

## 2023-02-07 MED ORDER — IOHEXOL 350 MG/ML SOLN: 100.0000 mL | Freq: Once | INTRAVENOUS | Status: DC | PRN

## 2023-02-07 MED ORDER — IOHEXOL 300 MG/ML  SOLN
100.0000 mL | Freq: Once | INTRAMUSCULAR | Status: AC | PRN
  Administered 2023-02-07: 100 mL via INTRAVENOUS

## 2023-02-10 ENCOUNTER — Ambulatory Visit: Payer: Medicaid Other | Admitting: Orthopedic Surgery

## 2023-02-11 ENCOUNTER — Ambulatory Visit: Payer: Medicaid Other | Admitting: Family

## 2023-02-11 ENCOUNTER — Encounter: Payer: Self-pay | Admitting: Family

## 2023-02-11 VITALS — BP 100/70 | HR 106 | Ht 66.0 in | Wt 254.8 lb

## 2023-02-11 DIAGNOSIS — K439 Ventral hernia without obstruction or gangrene: Secondary | ICD-10-CM | POA: Diagnosis not present

## 2023-02-11 DIAGNOSIS — M5431 Sciatica, right side: Secondary | ICD-10-CM

## 2023-02-11 DIAGNOSIS — M533 Sacrococcygeal disorders, not elsewhere classified: Secondary | ICD-10-CM

## 2023-02-11 MED ORDER — WEGOVY 1 MG/0.5ML ~~LOC~~ SOAJ: 1.0000 mg | SUBCUTANEOUS | 3 refills | Status: DC

## 2023-02-12 ENCOUNTER — Encounter: Payer: Self-pay | Admitting: Family

## 2023-02-12 DIAGNOSIS — M5431 Sciatica, right side: Secondary | ICD-10-CM | POA: Insufficient documentation

## 2023-02-12 NOTE — Assessment & Plan Note (Signed)
Continue current meds, decreasing dose to 1 mg.  Will adjust as needed based on results.  The patient is asked to make an attempt to improve diet and exercise patterns to aid in medical management of this problem. Addressed importance of increasing and maintaining water intake.

## 2023-02-12 NOTE — Progress Notes (Signed)
Established Patient Office Visit  Subjective:  Patient ID: Karen Hill, female    DOB: [birth year]  Age: 48 y.o. MRN: [id number]  Chief Complaint  Patient presents with   Follow-up    10 day follow up    Patient is here today for her 10 day follow up.  She has been feeling slightly better since last appointment.   She does have additional concerns to discuss today.  1) She asks if we can decrease the wegovy back to 1 mg dose for her, as she has been having a lot of nausea, vomiting, abdominal pain, and bloating since she increased the dose. She did not have these symptoms prior to the increase 2) Had an abdominal CT scan, which has not been read at this point, but does appear to show a loop of bowel in the hernia.  3) The pain in her tailbone is improving, though still there, and her sciatica is still bothering her. She did already hear from the neurosurgery office and they have scheduled her for appointment in about 2 weeks.  Labs are not due today. She needs refills.   I have reviewed her active problem list, medication list, allergies, notes from last encounter, lab results for her appointment today.      No other concerns at this time.   Past Medical History:  Diagnosis Date   Liver hemangioma    Lymphedema    Thyroid disease     Past Surgical History:  Procedure Laterality Date   CESAREAN SECTION  2014   CHOLECYSTECTOMY  2005   MANDIBLE FRACTURE SURGERY     MULTIPLE TOOTH EXTRACTIONS     TUBAL LIGATION  2014    Social History   Socioeconomic History   Marital status: Married    Spouse name: Not on file   Number of children: Not on file   Years of education: Not on file   Highest education level: Not on file  Occupational History   Not on file  Tobacco Use   Smoking status: Every Day    Current packs/day: 1.00    Average packs/day: 1 pack/day for 10.0 years (10.0 ttl pk-yrs)    Types: Cigarettes   Smokeless tobacco: Never  Substance and Sexual  Activity   Alcohol use: Yes    Comment: rare   Drug use: No   Sexual activity: Not on file  Other Topics Concern   Not on file  Social History Narrative   Not on file   Social Drivers of Health   Financial Resource Strain: Not on file  Food Insecurity: Not on file  Transportation Needs: Not on file  Physical Activity: Not on file  Stress: Not on file  Social Connections: Not on file  Intimate Partner Violence: Not on file    Family History  Problem Relation Age of Onset   Diabetes Mother    Hypertension Father    Breast cancer Neg Hx     Allergies  Allergen Reactions   Penicillin G Hives and Itching   Amoxicillin    Shellfish Allergy Nausea And Vomiting    ROS     Objective:   BP 100/70   Pulse (!) 106   Ht 5\' 6"  (1.676 m)   Wt 254 lb 12.8 oz (115.6 kg)   LMP 02/07/2023 (Exact Date)   SpO2 94%   BMI 41.13 kg/m   Vitals:   02/11/23 1101  BP: 100/70  Pulse: (!) 106  Height: 5\' 6"  (1.676 m)  Weight: 254 lb 12.8 oz (115.6 kg)  SpO2: 94%  BMI (Calculated): 41.15    Physical Exam   No results found for any visits on 02/11/23.  Recent Results (from the past 2160 hours)  Comprehensive metabolic panel     Status: None   Collection Time: 02/01/23  1:33 PM  Result Value Ref Range   Glucose 97 70 - 99 mg/dL   BUN 8 6 - 24 mg/dL   Creatinine, Ser 2.95 0.57 - 1.00 mg/dL   eGFR 85 >62 ZH/YQM/5.78   BUN/Creatinine Ratio 9 9 - 23   Sodium 141 134 - 144 mmol/L   Potassium 4.5 3.5 - 5.2 mmol/L   Chloride 102 96 - 106 mmol/L   CO2 23 20 - 29 mmol/L   Calcium 9.6 8.7 - 10.2 mg/dL   Total Protein 7.0 6.0 - 8.5 g/dL   Albumin 4.0 3.9 - 4.9 g/dL   Globulin, Total 3.0 1.5 - 4.5 g/dL   Bilirubin Total 0.5 0.0 - 1.2 mg/dL   Alkaline Phosphatase 109 44 - 121 IU/L   AST 17 0 - 40 IU/L   ALT 9 0 - 32 IU/L  TSH + free T4     Status: Abnormal   Collection Time: 02/01/23  1:33 PM  Result Value Ref Range   TSH 0.124 (L) 0.450 - 4.500 uIU/mL   Free T4 1.27 0.82  - 1.77 ng/dL  hCG, serum, qualitative     Status: None   Collection Time: 02/01/23  1:33 PM  Result Value Ref Range   hCG,Beta Subunit,Qual,Serum Negative Negative <6 mIU/mL       Assessment & Plan:   Problem List Items Addressed This Visit       Nervous and Auditory   Sciatica of right side   Referral already sent to NS, and patient has appointment scheduled.  Will await their recommendations regarding next steps.      Relevant Medications   Semaglutide-Weight Management (WEGOVY) 1 MG/0.5ML SOAJ     Other   Abdominal hernia without obstruction and without gangrene - Primary   Sending referral to General Surgery for patient given CT scan and the apparent loop of bowel that is contained there.       Obesity, morbid (HCC)   Continue current meds, decreasing dose to 1 mg.  Will adjust as needed based on results.  The patient is asked to make an attempt to improve diet and exercise patterns to aid in medical management of this problem. Addressed importance of increasing and maintaining water intake.        Relevant Medications   Semaglutide-Weight Management (WEGOVY) 1 MG/0.5ML SOAJ   Other Visit Diagnoses       Coccygeal pain       Symptoms improving.  Will reassess at follow up in 2 weeks.       Return as previously scheduled, 2/17.   Total time spent: 20 minutes  Miki Kins, FNP  02/11/2023   This document may have been prepared by [facility] Voice Recognition software and as such may include unintentional dictation errors.

## 2023-02-12 NOTE — Assessment & Plan Note (Signed)
Referral already sent to NS, and patient has appointment scheduled.  Will await their recommendations regarding next steps.

## 2023-02-12 NOTE — Assessment & Plan Note (Signed)
Sending referral to General Surgery for patient given CT scan and the apparent loop of bowel that is contained there.

## 2023-02-13 ENCOUNTER — Encounter: Payer: Self-pay | Admitting: Physician Assistant

## 2023-02-14 ENCOUNTER — Other Ambulatory Visit: Payer: Self-pay

## 2023-02-14 ENCOUNTER — Telehealth: Payer: Self-pay

## 2023-02-14 DIAGNOSIS — K769 Liver disease, unspecified: Secondary | ICD-10-CM

## 2023-02-14 DIAGNOSIS — R1084 Generalized abdominal pain: Secondary | ICD-10-CM

## 2023-02-14 MED ORDER — PEG 3350-KCL-NA BICARB-NACL 420 G PO SOLR: 4000.0000 mL | Freq: Once | ORAL | 0 refills | Status: AC

## 2023-02-14 NOTE — Telephone Encounter (Signed)
Scheduled EGD and Colon 03-09-23  MRI scheduled: Friday, February 7th 12:30 arrival @ [facility]-nothing to eat drink 4 hours prior.   Call and notify patient her abdominal pelvic CT with contrast showed:  1.  Large ventral hernia containing fat and portion of the transverse colon.  No evidence of bowel obstruction.  2.  2.8 cm liver lesion, most likely benign hemangioma, however **we need to order a liver MRI w & w/o contrast to further evaluate in more detail.  3.  Mildly enlarged spleen.  **We need to schedule Abdominal MRI to further evaluate.  4.  Evidence of chronic inflammation in the stomach and large intestine.  **I recommend we schedule an upper endoscopy and a colonoscopy to further evaluate.  5.  Prominent mildly enlarged abdominal lymph nodes, nonspecific.  Schedule colonoscopy for further evaluation.  6.  Uterine thickening.  Was she on her menstrual cycle during the time of the CT scan?  If not, then she needs follow-up with GYN.  Celso Amy, PA-C

## 2023-02-18 ENCOUNTER — Ambulatory Visit
Admission: RE | Admit: 2023-02-18 | Discharge: 2023-02-18 | Disposition: A | Discharge status: Home / Self Care | Payer: Medicaid Other | Source: Ambulatory Visit | Attending: Physician Assistant | Admitting: Physician Assistant

## 2023-02-18 DIAGNOSIS — R1084 Generalized abdominal pain: Secondary | ICD-10-CM | POA: Insufficient documentation

## 2023-02-18 DIAGNOSIS — K769 Liver disease, unspecified: Secondary | ICD-10-CM | POA: Insufficient documentation

## 2023-02-18 MED ORDER — GADOBUTROL 1 MMOL/ML IV SOLN
10.0000 mL | Freq: Once | INTRAVENOUS | Status: AC | PRN
  Administered 2023-02-18: 10 mL via INTRAVENOUS

## 2023-02-18 NOTE — Progress Notes (Signed)
Referring Physician:  Miki Kins, FNP [address] [place],  [place] [address]  Primary Physician:  Miki Kins, FNP  History of Present Illness: 02/22/2023 [name] has a history of hypothyroidism, prediabetes, lymphedema of lower extremities, and obesity.   She has constant right sided LBP with right lateral/posterior leg pain to her foot. No left leg pain. She has numbness and tingling only in her right foot. Pain is worse with bending, standing still, and sitting. Some relief with laying flat. She has weakness in right leg.   She is taking baclofen and neurontin. These help very little.   She smokes 1 PPD x 15 years.   Bowel/Bladder Dysfunction: she notes some urinary leaking for over 1 month. No bowel issues. No perineal numbness.   Conservative measures:  Physical therapy: has not participated in PT Multimodal medical therapy including regular antiinflammatories:  Gabapentin, Prednisone Injections: no epidural steroid injections  Past Surgery: no spinal surgeries  Karen Hill has no symptoms of cervical myelopathy.  The symptoms are causing a significant impact on the patient's life.   Review of Systems:  A 10 point review of systems is negative, except for the pertinent positives and negatives detailed in the HPI.  Past Medical History: Past Medical History:  Diagnosis Date   Liver hemangioma    Lymphedema    Thyroid disease     Past Surgical History: Past Surgical History:  Procedure Laterality Date   CESAREAN SECTION  2014   CHOLECYSTECTOMY  2005   MANDIBLE FRACTURE SURGERY     MULTIPLE TOOTH EXTRACTIONS     TUBAL LIGATION  2014    Allergies: Allergies as of 02/22/2023 - Review Complete 02/22/2023  Allergen Reaction Noted   Penicillin g Hives and Itching 11/08/2013   Amoxicillin  04/01/2022   Shellfish allergy Nausea And Vomiting 03/12/2016    Medications: Outpatient Encounter Medications as of 02/22/2023  Medication Sig    albuterol (PROAIR HFA) 108 (90 Base) MCG/ACT inhaler Inhale 2 puffs into the lungs every 6 (six) hours as needed for wheezing or shortness of breath.   baclofen (LIORESAL) 10 MG tablet Take 1 tablet (10 mg total) by mouth 2 (two) times daily as needed for muscle spasms.   gabapentin (NEURONTIN) 300 MG capsule TAKE 1 CAPSULE BY MOUTH NIGHTLY AT BEDTIME FOR 7 DAYS, INCREASE TO 2 CAPS AT BEDTIME FOR NEUROPATHY, APPT TIME AND LABS PLEASE   levothyroxine (SYNTHROID) 125 MCG tablet Take 125 mcg by mouth daily before breakfast.   Semaglutide-Weight Management (WEGOVY) 1 MG/0.5ML SOAJ Inject 1 mg into the skin once a week.   polyethylene glycol-electrolytes (NULYTELY) 420 g solution Take 4,000 mLs by mouth once. (Patient not taking: Reported on 02/22/2023)   [DISCONTINUED] lubiprostone (AMITIZA) 24 MCG capsule Take 1 capsule (24 mcg total) by mouth 2 (two) times daily with a meal.   [DISCONTINUED] predniSONE (DELTASONE) 20 MG tablet Take 2 tablets (40 mg total) by mouth daily with breakfast. (Patient not taking: Reported on 02/11/2023)   No facility-administered encounter medications on file as of 02/22/2023.    Social History: Social History   Tobacco Use   Smoking status: Every Day    Current packs/day: 1.00    Average packs/day: 1 pack/day for 10.0 years (10.0 ttl pk-yrs)    Types: Cigarettes   Smokeless tobacco: Never  Substance Use Topics   Alcohol use: Yes    Comment: rare   Drug use: No    Family Medical History: Family History  Problem Relation Age of Onset   Diabetes Mother    Hypertension Father    Breast cancer Neg Hx     Physical Examination: Vitals:   02/22/23 0909  BP: 116/82    General: Patient is well developed, well nourished, calm, collected, and in no apparent distress. Attention to examination is appropriate.  Respiratory: Patient is breathing without any difficulty.   NEUROLOGICAL:     Awake, alert, oriented to person, place, and time.  Speech is clear and  fluent. Fund of knowledge is appropriate.   Cranial Nerves: Pupils equal round and reactive to light.  Facial tone is symmetric.    No posterior lumbar tenderness.   No abnormal lesions on exposed skin.   Strength: Side Biceps Triceps Deltoid Interossei Grip Wrist Ext. Wrist Flex.  R 5 5 5 5 5 5 5   L 5 5 5 5 5 5 5    Side Iliopsoas Quads Hamstring PF DF EHL  R 5 5 5 5 5 5   L 5 5 5 5 5 5    Reflexes are 2+ and symmetric at the biceps, brachioradialis, patella and achilles.   Hoffman's is absent.  Clonus is not present.   Bilateral upper and lower extremity sensation is intact to light touch.     Has known lymphedema in both legs.   Gait is slow with slight limp favoring right leg.    Medical Decision Making  Imaging: No lumbar imaging. CT of abdomen and pelvis on 02/07/23 showed DDD L5-S1 with modic type endplate changes.   Assessment and Plan: Karen Hill is a pleasant 48 y.o. female has constant right sided LBP with right lateral/posterior leg pain to her foot x years. No left leg pain. She has numbness and tingling only in her right foot. She has weakness in right leg.   No lumbar imaging. CT of abdomen and pelvis on 02/07/23 showed DDD L5-S1 with modic type endplate changes.   LBP and right leg pain appear to be lumbar mediated.   Treatment options discussed with patient and following plan made:   - MRI of lumbar spine to further evaluate lumbar radiculopathy. No improvement with medications or time.  - Lumbar xrays to be done when she has MRI.  - Depending on results, may consider PT and/or injections.  - She notes some urinary leakage for about a month. No bowel issues. No perineal numbness. This does not appear lumbar mediated. She will f/u with PCP regarding this. Reviewed red flag symptoms.   - Will schedule follow up visit to review MRI results once I get them back.   I spent a total of 30 minutes in face-to-face and non-face-to-face activities related to this  patient's care today including review of outside records, review of imaging, review of symptoms, physical exam, discussion of differential diagnosis, discussion of treatment options, and documentation.   Thank you for involving me in the care of this patient.   Drake Leach PA-C Dept. of Neurosurgery

## 2023-02-20 ENCOUNTER — Other Ambulatory Visit: Payer: Self-pay | Admitting: Family

## 2023-02-22 ENCOUNTER — Ambulatory Visit (INDEPENDENT_AMBULATORY_CARE_PROVIDER_SITE_OTHER): Payer: Medicaid Other | Admitting: Orthopedic Surgery

## 2023-02-22 ENCOUNTER — Encounter: Payer: Self-pay | Admitting: Physician Assistant

## 2023-02-22 ENCOUNTER — Encounter: Payer: Self-pay | Admitting: Orthopedic Surgery

## 2023-02-22 VITALS — BP 116/82 | Ht 66.0 in | Wt 258.0 lb

## 2023-02-22 DIAGNOSIS — G8929 Other chronic pain: Secondary | ICD-10-CM | POA: Diagnosis not present

## 2023-02-22 DIAGNOSIS — M5416 Radiculopathy, lumbar region: Secondary | ICD-10-CM | POA: Diagnosis not present

## 2023-02-22 DIAGNOSIS — M51362 Other intervertebral disc degeneration, lumbar region with discogenic back pain and lower extremity pain: Secondary | ICD-10-CM | POA: Diagnosis not present

## 2023-02-22 NOTE — Patient Instructions (Addendum)
It was so nice to see you today. Thank you so much for coming in.    CT of your abdomen and pelvis showed some wear and tear in your back at L5-S1.   I want to get an MRI and xrays of your lower back to look into things further. We will get this approved through your insurance and [facility] will call you to schedule the appointment.   After you have the MRI and xrays, it takes 14-21 days for me to get the results back. Once I have them, we will call you to schedule a follow up visit with me to review them.   Follow up with PCP regarding urinary leaking. If you develop any incontinence of bowels/bladder or new weakness in your legs then go to the emergency room.   Please do not hesitate to call if you have any questions or concerns. You can also message me in MyChart.   Drake Leach PA-C [phone]     The physicians and staff at [facility] Neuro[facility] at [facility] are committed to providing excellent care. You may receive a survey asking for feedback about your experience at our office. We value you your feedback and appreciate you taking the time to to fill it out. The [facility] leadership team is also available to discuss your experience in person, feel free to contact us [phone].

## 2023-02-23 ENCOUNTER — Ambulatory Visit (INDEPENDENT_AMBULATORY_CARE_PROVIDER_SITE_OTHER): Payer: Medicaid Other | Admitting: Surgery

## 2023-02-23 ENCOUNTER — Encounter: Payer: Self-pay | Admitting: Surgery

## 2023-02-23 VITALS — BP 91/57 | HR 102 | Ht 66.0 in | Wt 257.0 lb

## 2023-02-23 DIAGNOSIS — K436 Other and unspecified ventral hernia with obstruction, without gangrene: Secondary | ICD-10-CM | POA: Diagnosis not present

## 2023-02-23 NOTE — Progress Notes (Signed)
Patient ID: Karen Hill, female   DOB: August 03, 1975, 48 y.o.   MRN: [id number]  HPI Karen Hill is a 48 y.o. female seen in consultation for symptomatic and worsening ventral hernia.  She did have a history of cholecystectomy as well as C-section in the past and tubal ligation.  She thinks that this might be a recurrent. Usually I saw her over 5 years ago and at that time I asked her to optimize her weight and also participate in a smoking cessation program.  She did lose some weight but then she lost follow-up.  Recently she has been having more abdominal pain that is moderate intermittent and worsening with certain meals.  The hernia has progressed to the point that now is not reducible.  She does have daily symptoms.  She continues to smoke about a pack a day.  He does have some chronic pulmonary issues.  He did have a recent CT scan that have personally reviewed showing evidence of a large ventral hernia with some loss of domain and chronically incarcerated bowel Has been started on GLP-1 inhibitor with good results and she has lost about 30 pounds  HPI  Past Medical History:  Diagnosis Date   Liver hemangioma    Lymphedema    Thyroid disease     Past Surgical History:  Procedure Laterality Date   CESAREAN SECTION  2014   CHOLECYSTECTOMY  2005   MANDIBLE FRACTURE SURGERY     MULTIPLE TOOTH EXTRACTIONS     TUBAL LIGATION  2014    Family History  Problem Relation Age of Onset   Diabetes Mother    Hypertension Father    Breast cancer Neg Hx     Social History Social History   Tobacco Use   Smoking status: Every Day    Current packs/day: 1.00    Average packs/day: 1 pack/day for 10.0 years (10.0 ttl pk-yrs)    Types: Cigarettes    Passive exposure: Past   Smokeless tobacco: Never  Vaping Use   Vaping status: Never Used  Substance Use Topics   Alcohol use: Yes    Comment: rare   Drug use: No    Allergies  Allergen Reactions   Penicillin G Hives and Itching    Amoxicillin    Shellfish Allergy Nausea And Vomiting    Current Outpatient Medications  Medication Sig Dispense Refill   albuterol (PROAIR HFA) 108 (90 Base) MCG/ACT inhaler Inhale 2 puffs into the lungs every 6 (six) hours as needed for wheezing or shortness of breath. 108 each 3   baclofen (LIORESAL) 10 MG tablet Take 1 tablet (10 mg total) by mouth 2 (two) times daily as needed for muscle spasms. 120 tablet 0   gabapentin (NEURONTIN) 300 MG capsule TAKE 1 CAPSULE BY MOUTH NIGHTLY AT BEDTIME FOR 7 DAYS, INCREASE TO 2 CAPS AT BEDTIME FOR NEUROPATHY, APPT TIME AND LABS PLEASE 60 capsule 6   levothyroxine (SYNTHROID) 125 MCG tablet Take 125 mcg by mouth daily before breakfast.     Semaglutide-Weight Management (WEGOVY) 1 MG/0.5ML SOAJ Inject 1 mg into the skin once a week. 2 mL 3   polyethylene glycol-electrolytes (NULYTELY) 420 g solution Take 4,000 mLs by mouth once. (Patient not taking: Reported on 02/23/2023)     No current facility-administered medications for this visit.     Review of Systems Full ROS  was asked and was negative except for the information on the HPI  Physical Exam Blood pressure (!) 91/57, pulse (!) 102,  height 5\' 6"  (1.676 m), weight 257 lb (116.6 kg), last menstrual period 02/07/2023, SpO2 98%. CONSTITUTIONAL: NAD BMI 41. EYES: Pupils are equal, round, and reactive to light, Sclera are non-icteric. EARS, NOSE, MOUTH AND THROAT: The oropharynx is clear. The oral mucosa is pink and moist. Hearing is intact to voice. LYMPH NODES:  Lymph nodes in the neck are normal. RESPIRATORY:  Lungs are clear. There is normal respiratory effort, with equal breath sounds bilaterally, and without pathologic use of accessory muscles. CARDIOVASCULAR: Heart is regular without murmurs, gallops, or rubs. GI: The abdomen is  soft, large chronically incarcerated ventral hernia between umbilicus and xiphoid. Tender to palpation. No peritonitis, there is loss of domain. There are no palpable  masses. There is no hepatosplenomegaly. There are normal bowel sounds GU: Rectal deferred.   MUSCULOSKELETAL: Normal muscle strength and tone. No cyanosis or edema.   SKIN: Turgor is good and there are no pathologic skin lesions or ulcers. NEUROLOGIC: Motor and sensation is grossly normal. Cranial nerves are grossly intact. PSYCH:  Oriented to person, place and time. Affect is normal.  Data Reviewed  I have personally reviewed the patient's imaging, laboratory findings and medical records.    Assessment /Plan 48 year old female with recurrent currently incarcerated ventral hernia with loss of domain.  She does have multiple risk factor including a BMI of 41 as well as active smoker.  I had an extensive discussion with the patient about her disease process.  Unfortunately her symptoms are crescendo to the point that her hernia is currently not reducible. Currently there is no signs of strangulation. SHe has lost about 30 pounds since she has been on GLP-1 inhibitor. Counseled her again about weight optimization and smoking cessation. Do think that she is between a rock and a hard place.  Her symptoms seems to be worsening and that is usually a tell sign for potential more complications in the near future.  Unfortunately she is less than ideal surgical candidate.  I did have a very frank discussion with her.  In my experience these are very difficult cases because we are unable to optimize them in the real world and that usually presents within a strangulation that significantly compromises the abdominal wall and also increase tremendously the duration of complications. You are having a good discussion with the patient I did not deny her surgery but at the same time I was not in a rush to perform the repair.  She is certainly at increased risk of developing perioperative complication including recurrence and infection.  She is currently alone and I asked her to see me 1 more time with her husband.  We  could potentially perform a robotic repair component separation release.  Sometimes these are very challenging cases that may require open intervention. Again I counseled her about smoking cessation and weight optimization and she is going to try to do her best Note that I spent more than 60 minutes in this encounter including personally reviewing imaging studies, coordinating her care, placing orders and performing documentation    Sterling Big, MD FACS General Surgeon 02/23/2023, 12:19 PM

## 2023-02-23 NOTE — Patient Instructions (Signed)
We will sent a letter to your PCP to get clearance from them for surgery. We will see you back here in 3  Weeks after your colonoscopy results come back.      Please call and ask to speak with a nurse if you develop questions or concerns.

## 2023-02-28 ENCOUNTER — Ambulatory Visit
Admission: RE | Admit: 2023-02-28 | Discharge: 2023-02-28 | Disposition: A | Discharge status: Home / Self Care | Payer: Medicaid Other | Source: Home / Self Care | Attending: Orthopedic Surgery | Admitting: Orthopedic Surgery

## 2023-02-28 ENCOUNTER — Ambulatory Visit: Payer: Medicaid Other | Admitting: Family

## 2023-02-28 ENCOUNTER — Ambulatory Visit
Admission: RE | Admit: 2023-02-28 | Discharge: 2023-02-28 | Disposition: A | Discharge status: Home / Self Care | Payer: Medicaid Other | Source: Ambulatory Visit | Attending: Orthopedic Surgery | Admitting: Orthopedic Surgery

## 2023-02-28 ENCOUNTER — Encounter: Payer: Self-pay | Admitting: Family

## 2023-02-28 VITALS — BP 100/68 | HR 96 | Ht 66.0 in | Wt 257.6 lb

## 2023-02-28 DIAGNOSIS — M5117 Intervertebral disc disorders with radiculopathy, lumbosacral region: Secondary | ICD-10-CM | POA: Insufficient documentation

## 2023-02-28 DIAGNOSIS — M51362 Other intervertebral disc degeneration, lumbar region with discogenic back pain and lower extremity pain: Secondary | ICD-10-CM | POA: Diagnosis present

## 2023-02-28 DIAGNOSIS — E782 Mixed hyperlipidemia: Secondary | ICD-10-CM

## 2023-02-28 DIAGNOSIS — Z0130 Encounter for examination of blood pressure without abnormal findings: Secondary | ICD-10-CM

## 2023-02-28 DIAGNOSIS — R7303 Prediabetes: Secondary | ICD-10-CM

## 2023-02-28 DIAGNOSIS — G8929 Other chronic pain: Secondary | ICD-10-CM | POA: Diagnosis present

## 2023-02-28 DIAGNOSIS — M5416 Radiculopathy, lumbar region: Secondary | ICD-10-CM | POA: Insufficient documentation

## 2023-02-28 DIAGNOSIS — Z1159 Encounter for screening for other viral diseases: Secondary | ICD-10-CM

## 2023-02-28 DIAGNOSIS — E039 Hypothyroidism, unspecified: Secondary | ICD-10-CM | POA: Diagnosis not present

## 2023-02-28 DIAGNOSIS — Z01818 Encounter for other preprocedural examination: Secondary | ICD-10-CM

## 2023-02-28 DIAGNOSIS — E559 Vitamin D deficiency, unspecified: Secondary | ICD-10-CM

## 2023-02-28 DIAGNOSIS — M5441 Lumbago with sciatica, right side: Secondary | ICD-10-CM | POA: Diagnosis present

## 2023-02-28 DIAGNOSIS — E538 Deficiency of other specified B group vitamins: Secondary | ICD-10-CM | POA: Diagnosis not present

## 2023-02-28 DIAGNOSIS — R5383 Other fatigue: Secondary | ICD-10-CM

## 2023-02-28 MED ORDER — NICOTINE 21 MG/24HR TD PT24: 21.0000 mg | MEDICATED_PATCH | TRANSDERMAL | 0 refills | Status: AC

## 2023-02-28 MED ORDER — NICOTINE 14 MG/24HR TD PT24: 14.0000 mg | MEDICATED_PATCH | Freq: Every day | TRANSDERMAL | 1 refills | Status: DC

## 2023-03-01 ENCOUNTER — Ambulatory Visit: Payer: Medicaid Other | Admitting: Physician Assistant

## 2023-03-01 LAB — CBC WITH DIFFERENTIAL/PLATELET
Basophils Absolute: 0 10*3/uL (ref 0.0–0.2)
Basos: 0 %
EOS (ABSOLUTE): 0.2 10*3/uL (ref 0.0–0.4)
Eos: 2 %
Hematocrit: 39.3 % (ref 34.0–46.6)
Hemoglobin: 12.7 g/dL (ref 11.1–15.9)
Immature Grans (Abs): 0 10*3/uL (ref 0.0–0.1)
Immature Granulocytes: 0 %
Lymphocytes Absolute: 2.3 10*3/uL (ref 0.7–3.1)
Lymphs: 25 %
MCH: 30.6 pg (ref 26.6–33.0)
MCHC: 32.3 g/dL (ref 31.5–35.7)
MCV: 95 fL (ref 79–97)
Monocytes Absolute: 0.3 10*3/uL (ref 0.1–0.9)
Monocytes: 4 %
Neutrophils Absolute: 6.3 10*3/uL (ref 1.4–7.0)
Neutrophils: 69 %
Platelets: 232 10*3/uL (ref 150–450)
RBC: 4.15 x10E6/uL (ref 3.77–5.28)
RDW: 13.1 % (ref 11.7–15.4)
WBC: 9.2 10*3/uL (ref 3.4–10.8)

## 2023-03-01 LAB — IRON,TIBC AND FERRITIN PANEL
Ferritin: 20 ng/mL (ref 15–150)
Iron Saturation: 12 % — ABNORMAL LOW (ref 15–55)
Iron: 38 ug/dL (ref 27–159)
Total Iron Binding Capacity: 327 ug/dL (ref 250–450)
UIBC: 289 ug/dL (ref 131–425)

## 2023-03-01 LAB — TSH+T4F+T3FREE
Free T4: 1.71 ng/dL (ref 0.82–1.77)
T3, Free: 3.9 pg/mL (ref 2.0–4.4)
TSH: 0.034 u[IU]/mL — ABNORMAL LOW (ref 0.450–4.500)

## 2023-03-01 LAB — CMP14+EGFR
ALT: 6 [IU]/L (ref 0–32)
AST: 11 [IU]/L (ref 0–40)
Albumin: 3.6 g/dL — ABNORMAL LOW (ref 3.9–4.9)
Alkaline Phosphatase: 101 [IU]/L (ref 44–121)
BUN/Creatinine Ratio: 9 (ref 9–23)
BUN: 6 mg/dL (ref 6–24)
Bilirubin Total: 0.7 mg/dL (ref 0.0–1.2)
CO2: 24 mmol/L (ref 20–29)
Calcium: 8.7 mg/dL (ref 8.7–10.2)
Chloride: 106 mmol/L (ref 96–106)
Creatinine, Ser: 0.67 mg/dL (ref 0.57–1.00)
Globulin, Total: 2.6 g/dL (ref 1.5–4.5)
Glucose: 83 mg/dL (ref 70–99)
Potassium: 3.7 mmol/L (ref 3.5–5.2)
Sodium: 144 mmol/L (ref 134–144)
Total Protein: 6.2 g/dL (ref 6.0–8.5)
eGFR: 110 mL/min/{1.73_m2} (ref >59)

## 2023-03-01 LAB — LIPID PANEL
Chol/HDL Ratio: 4.9 {ratio} — ABNORMAL HIGH (ref 0.0–4.4)
Cholesterol, Total: 153 mg/dL (ref 100–199)
HDL: 31 mg/dL — ABNORMAL LOW (ref >39)
LDL Chol Calc (NIH): 101 mg/dL — ABNORMAL HIGH (ref 0–99)
Triglycerides: 117 mg/dL (ref 0–149)
VLDL Cholesterol Cal: 21 mg/dL (ref 5–40)

## 2023-03-01 LAB — HEMOGLOBIN A1C
Est. average glucose Bld gHb Est-mCnc: 94 mg/dL
Hgb A1c MFr Bld: 4.9 % (ref 4.8–5.6)

## 2023-03-01 LAB — VITAMIN D 25 HYDROXY (VIT D DEFICIENCY, FRACTURES): Vit D, 25-Hydroxy: 22.3 ng/mL — ABNORMAL LOW (ref 30.0–100.0)

## 2023-03-01 LAB — HCV INTERPRETATION

## 2023-03-01 LAB — HCV AB W REFLEX TO QUANT PCR: HCV Ab: NONREACTIVE

## 2023-03-01 LAB — VITAMIN B12: Vitamin B-12: 154 pg/mL — ABNORMAL LOW (ref 232–1245)

## 2023-03-06 NOTE — Progress Notes (Deleted)
 Celso Amy, PA-C [address]  [address]  [place], [place] [address]  Main: [phone]  Fax: [phone]   Primary Care Physician: Miki Kins, FNP  Primary Gastroenterologist:  ***  CC: Follow-up large ventral hernia and constipation.  HPI: Karen Hill is a 48 y.o. female returns for 1 month follow-up of large abdominal wall hernia since 2013.  Becoming larger and tender.  Protruding from the middle lower abdomen, reducible.  She had evaluation by surgeon Dr. Everlene Farrier for ventral hernia 02/2018.  1 month ago was started on Amitiza 24 mcg twice daily to help constipation.  She is also taking Wegovy to help with weight loss.  02/07/2023 abdominal pelvic CT with contrast: Large ventral hernia containing fat and nonobstructing portion of the transverse colon.  2.8 cm liver lesion.  Splenomegaly.  Submucosal fibrofatty infiltration of the gastric antrum and right colon, concerning for chronic inflammation.  02/18/2023 abdominal MRI showed benign liver hemangioma.  Large ventral hernia with no evidence of bowel obstruction.  Normal stomach and intestines.  Previous cholecystectomy.  02/23/2023: She had follow-up appointment with surgeon Dr. Everlene Farrier.  She is considered to be high risk for surgery.  Current BMI 41.  Current smoker.  She was counseled to stop smoking and continue weight loss.  Patient is continuing to follow-up to discuss surgery for worsening ventral hernia symptoms.  02/28/2023 labs showed low iron saturation 12%, low vitamin B12 of 154, low vitamin D of 22.3.  Hemoglobin 12.7, MCV 95.  Current Outpatient Medications  Medication Sig Dispense Refill   albuterol (PROAIR HFA) 108 (90 Base) MCG/ACT inhaler Inhale 2 puffs into the lungs every 6 (six) hours as needed for wheezing or shortness of breath. 108 each 3   baclofen (LIORESAL) 10 MG tablet Take 1 tablet (10 mg total) by mouth 2 (two) times daily as needed for muscle spasms. 120 tablet 0   gabapentin (NEURONTIN)  300 MG capsule TAKE 1 CAPSULE BY MOUTH NIGHTLY AT BEDTIME FOR 7 DAYS, INCREASE TO 2 CAPS AT BEDTIME FOR NEUROPATHY, APPT TIME AND LABS PLEASE 60 capsule 6   levothyroxine (SYNTHROID) 125 MCG tablet Take 125 mcg by mouth daily before breakfast.     [START ON 03/28/2023] nicotine (NICODERM CQ - DOSED IN MG/24 HOURS) 14 mg/24hr patch Place 1 patch (14 mg total) onto the skin daily. 28 patch 1   nicotine (NICODERM CQ - DOSED IN MG/24 HOURS) 21 mg/24hr patch Place 1 patch (21 mg total) onto the skin daily for 28 days. 28 patch 0   polyethylene glycol-electrolytes (NULYTELY) 420 g solution Take 4,000 mLs by mouth once. (Patient not taking: Reported on 02/22/2023)     Semaglutide-Weight Management (WEGOVY) 1 MG/0.5ML SOAJ Inject 1 mg into the skin once a week. 2 mL 3   No current facility-administered medications for this visit.    Allergies as of 03/08/2023 - Review Complete 02/28/2023  Allergen Reaction Noted   Penicillin g Hives and Itching 11/08/2013   Amoxicillin  04/01/2022   Shellfish allergy Nausea And Vomiting 03/12/2016    Past Medical History:  Diagnosis Date   Liver hemangioma    Lymphedema    Thyroid disease     Past Surgical History:  Procedure Laterality Date   CESAREAN SECTION  2014   CHOLECYSTECTOMY  2005   MANDIBLE FRACTURE SURGERY     MULTIPLE TOOTH EXTRACTIONS     TUBAL LIGATION  2014    Review of Systems:    All systems reviewed and  negative except where noted in HPI.   Physical Examination:   LMP 02/07/2023 (Exact Date)   General: Well-nourished, well-developed in no acute distress.  Lungs: Clear to auscultation bilaterally. Non-labored. Heart: Regular rate and rhythm, no murmurs rubs or gallops.  Abdomen: Bowel sounds are normal; Abdomen is Soft; No hepatosplenomegaly, masses or hernias;  No Abdominal Tenderness; No guarding or rebound tenderness. Neuro: Alert and oriented x 3.  Grossly intact.  Psych: Alert and cooperative, normal mood and  affect.   Imaging Studies: MR ABDOMEN W WO CONTRAST Result Date: 02/22/2023 CLINICAL DATA:  Characterize suspected hemangioma incidentally identified by prior CT EXAM: MRI ABDOMEN WITHOUT AND WITH CONTRAST TECHNIQUE: Multiplanar multisequence MR imaging of the abdomen was performed both before and after the administration of intravenous contrast. CONTRAST:  10mL GADAVIST GADOBUTROL 1 MMOL/ML IV SOLN COMPARISON:  CT abdomen pelvis, 02/07/2023 FINDINGS: Lower chest: No acute abnormality. Hepatobiliary: T2 intermediate lesions with slow peripheral discontinuous nodular contrast enhancement in the posterior and inferior portions of hepatic segment VI (series 22, image 35, 59) consistent with benign hemangiomata. Status post cholecystectomy. No biliary ductal dilatation. Pancreas: Unremarkable. No pancreatic ductal dilatation or surrounding inflammatory changes. Spleen: Normal in size without significant abnormality. Adrenals/Urinary Tract: Adrenal glands are unremarkable. Kidneys are normal, without renal calculi, solid lesion, or hydronephrosis. Stomach/Bowel: Stomach is within normal limits. No evidence of bowel wall thickening, distention, or inflammatory changes. Vascular/Lymphatic: No significant vascular findings are present. No enlarged abdominal lymph nodes. Other: Large broad-based midline ventral hernia containing ascending and transverse colon, incompletely imaged. No ascites. Musculoskeletal: No acute or significant osseous findings. IMPRESSION: 1. Benign hemangiomata of hepatic segment VI, requiring no further follow-up or characterization. 2. Large broad-based midline ventral hernia containing ascending and transverse colon, incompletely imaged. 3. Status post cholecystectomy. Electronically Signed   By: Jearld Lesch M.D.   On: 02/22/2023 07:31   CT ABDOMEN PELVIS W CONTRAST Result Date: 02/13/2023 CLINICAL DATA:  Abdominal pain, ventral hernia. EXAM: CT ABDOMEN AND PELVIS WITH CONTRAST TECHNIQUE:  Multidetector CT imaging of the abdomen and pelvis was performed using the standard protocol following bolus administration of intravenous contrast. RADIATION DOSE REDUCTION: This exam was performed according to the departmental dose-optimization program which includes automated exposure control, adjustment of the mA and/or kV according to patient size and/or use of iterative reconstruction technique. CONTRAST:  OMNIPAQUE IOHEXOL 300 MG/ML  SOLN COMPARISON:  MRI September 20, 2005 FINDINGS: Lower chest: Right lower lobe scarring/atelectasis. Hepatobiliary: Hypodense 2.8 x 2.4 cm segment VI hepatic lesion on image 27/2 is new from 2007 this demonstrates some peripheral nodular discontinuous enhancement possibly reflecting a hemangioma but nonspecific. Gallbladder surgically absent. No biliary ductal dilation. Pancreas: No pancreatic ductal dilation or evidence of acute inflammation. Spleen: Splenomegaly measuring 14.9 cm in maximum axial dimension previously measuring 14 cm on MRI September 20, 2005 Adrenals/Urinary Tract: Bilateral adrenal glands appear normal. No hydronephrosis. Kidneys demonstrate symmetric enhancement. Urinary bladder is unremarkable for degree of distension. Stomach/Bowel: No radiopaque enteric contrast material was administered. Submucosal fibrofatty infiltration of the gastric antrum. No pathologic dilation of small or large bowel. No evidence of acute bowel inflammation. Submucosal fibrofatty infiltration of the cecum through the mid transverse colon. Normal appendix. Scattered colonic diverticulosis. Vascular/Lymphatic: Normal caliber abdominal aorta. Smooth IVC contours. The portal, splenic and superior mesenteric veins are patent. Retroperitoneal lymph nodes with prominent/mildly enlarged iliac side chain lymph nodes. For reference: -left external iliac lymph node measures 1 cm in short axis on image 71/2 -right external iliac lymph  node measures 7 mm in short axis on image 69/2.  Reproductive: Endometrial fluid/thickening with uterine heterogeneity may within normal limits for patient based on timing of menstrual cycle. Dominant follicles in the bilateral ovaries are physiologic/benign and requiring no independent imaging follow-up. Other: No significant abdominopelvic free fluid. Large ventral hernia contains fat and nonobstructed portion of transverse colon. Musculoskeletal: L5-S1 discogenic disease with Modic type endplate changes. IMPRESSION: 1. Large ventral hernia contains fat and nonobstructed portion of transverse colon. 2. Hypodense 2.8 cm segment VI hepatic lesion is new from 2007 this demonstrates some peripheral nodular discontinuous enhancement possibly reflecting a hemangioma but nonspecific. Recommend further evaluation with nonemergent outpatient liver protocol MRI with and without contrast. 3. Splenomegaly measuring 14.9 cm in maximum axial dimension previously measuring 14 cm on MRI September 20, 2005. 4. Submucosal fibrofatty infiltration of the gastric antrum may reflects sequela of chronic inflammation including peptic ulcerative disease. 5. Submucosal fibrofatty infiltration of the cecum through the mid transverse colon, suggestive of chronic inflammation. 6. Prominent/mildly enlarged iliac side chain lymph nodes, nonspecific but possibly reactive. 7. Endometrial fluid/thickening with uterine heterogeneity may within normal limits for patient based on timing of menstrual cycle. Suggest clinical correlation for abnormal uterine bleeding. Electronically Signed   By: Maudry Mayhew M.D.   On: 02/13/2023 08:44    Assessment and Plan:   Karen Hill is a 48 y.o. y/o female   1.  Large ventral hernia  Scheduling EGD & Colonoscopy.  F/U with Dr. Everlene Farrier, general surgeon, after procedures to discuss ventral hernia repair.  Continue weight loss efforts.  2.  Chronic constipation  3.  Abnormal CT stomach  H. Pylori  Scheduling EGD I discussed risks of EGD with  patient to include risk of bleeding, perforation, and risk of sedation.  Patient expressed understanding and agrees to proceed with EGD.   4.  Abnormal CT of the large intestine: Right Colon.  Scheduling Colonoscopy I discussed risks of colonoscopy with patient to include risk of bleeding, colon perforation, and risk of sedation.  Patient expressed understanding and agrees to proceed with colonoscopy.   5.  B12 deficiency and mild iron deficiency.  Stable hemoglobin 12.7.  Start vitamin B12, take 2000 units daily.  Start OTC iron ferrous sulfate 325 mg once daily.  Celso Amy, PA-C  Follow up ***  BP check ***

## 2023-03-07 ENCOUNTER — Telehealth: Payer: Self-pay | Admitting: Physician Assistant

## 2023-03-07 NOTE — Telephone Encounter (Signed)
 The patient called and left a voicemail The patient said that she trying to get her appointment reschedule. She said that her appointment was cancel but never a reschedule she said she needs to know what's going on. I called her back to let her know that we received I scheduled her follow up on 03/31/23 at 3:00 pm.

## 2023-03-08 ENCOUNTER — Ambulatory Visit: Payer: Medicaid Other | Admitting: Physician Assistant

## 2023-03-09 ENCOUNTER — Encounter
Admission: RE | Disposition: A | Discharge status: Home / Self Care | Payer: Self-pay | Source: Home / Self Care | Attending: Gastroenterology

## 2023-03-09 ENCOUNTER — Encounter: Payer: Self-pay | Admitting: Gastroenterology

## 2023-03-09 ENCOUNTER — Ambulatory Visit
Admission: RE | Admit: 2023-03-09 | Discharge: 2023-03-09 | Disposition: A | Discharge status: Home / Self Care | Payer: Medicaid Other | Attending: Gastroenterology | Admitting: Gastroenterology

## 2023-03-09 ENCOUNTER — Ambulatory Visit: Payer: Medicaid Other

## 2023-03-09 DIAGNOSIS — F17210 Nicotine dependence, cigarettes, uncomplicated: Secondary | ICD-10-CM | POA: Insufficient documentation

## 2023-03-09 DIAGNOSIS — Z79890 Hormone replacement therapy (postmenopausal): Secondary | ICD-10-CM | POA: Insufficient documentation

## 2023-03-09 DIAGNOSIS — E039 Hypothyroidism, unspecified: Secondary | ICD-10-CM | POA: Diagnosis not present

## 2023-03-09 DIAGNOSIS — K56609 Unspecified intestinal obstruction, unspecified as to partial versus complete obstruction: Secondary | ICD-10-CM

## 2023-03-09 DIAGNOSIS — Z1211 Encounter for screening for malignant neoplasm of colon: Secondary | ICD-10-CM | POA: Diagnosis not present

## 2023-03-09 DIAGNOSIS — R1084 Generalized abdominal pain: Secondary | ICD-10-CM

## 2023-03-09 HISTORY — PX: COLONOSCOPY WITH PROPOFOL: SHX5780

## 2023-03-09 SURGERY — COLONOSCOPY WITH PROPOFOL
Anesthesia: General

## 2023-03-09 MED ORDER — SODIUM CHLORIDE 0.9 % IV SOLN
INTRAVENOUS | Status: DC
Start: 2023-03-09 — End: 2023-03-09

## 2023-03-09 MED ORDER — PROPOFOL 500 MG/50ML IV EMUL
INTRAVENOUS | Status: DC | PRN
  Administered 2023-03-09: 100 mg via INTRAVENOUS
  Administered 2023-03-09: 200 ug/kg/min via INTRAVENOUS

## 2023-03-09 NOTE — Anesthesia Preprocedure Evaluation (Signed)
 Anesthesia Evaluation  Patient identified by MRN, date of birth, ID band Patient awake    Reviewed: Allergy & Precautions, NPO status , Patient's Chart, lab work & pertinent test results  History of Anesthesia Complications Negative for: history of anesthetic complications  Airway Mallampati: III  TM Distance: <3 FB Neck ROM: full    Dental  (+) Poor Dentition, Missing, Chipped   Pulmonary neg pulmonary ROS, neg shortness of breath, Current Smoker and Patient abstained from smoking.   Pulmonary exam normal        Cardiovascular Exercise Tolerance: Good (-) angina (-) Past MI Normal cardiovascular exam     Neuro/Psych  Neuromuscular disease  negative psych ROS   GI/Hepatic negative GI ROS, Neg liver ROS,neg GERD  ,,  Endo/Other  Hypothyroidism    Renal/GU negative Renal ROS  negative genitourinary   Musculoskeletal   Abdominal   Peds  Hematology negative hematology ROS (+)   Anesthesia Other Findings Past Medical History: No date: Liver hemangioma No date: Lymphedema No date: Thyroid disease  Past Surgical History: 2014: CESAREAN SECTION 2005: CHOLECYSTECTOMY No date: MANDIBLE FRACTURE SURGERY No date: MULTIPLE TOOTH EXTRACTIONS 2014: TUBAL LIGATION     Reproductive/Obstetrics negative OB ROS                             Anesthesia Physical Anesthesia Plan  ASA: 2  Anesthesia Plan: General   Post-op Pain Management:    Induction: Intravenous  PONV Risk Score and Plan: Propofol infusion and TIVA  Airway Management Planned: Natural Airway and Nasal Cannula  Additional Equipment:   Intra-op Plan:   Post-operative Plan:   Informed Consent: I have reviewed the patients History and Physical, chart, labs and discussed the procedure including the risks, benefits and alternatives for the proposed anesthesia with the patient or authorized representative who has indicated  his/her understanding and acceptance.     Dental Advisory Given  Plan Discussed with: Anesthesiologist, CRNA and Surgeon  Anesthesia Plan Comments: (Patient consented for risks of anesthesia including but not limited to:  - adverse reactions to medications - risk of airway placement if required - damage to eyes, teeth, lips or other oral mucosa - nerve damage due to positioning  - sore throat or hoarseness - Damage to heart, brain, nerves, lungs, other parts of body or loss of life  Patient voiced understanding and assent.)       Anesthesia Quick Evaluation

## 2023-03-09 NOTE — H&P (Signed)
 Wyline Mood, MD [address], [place], [place], [address] [address], [place], [place], [address] Phone: [phone]  Fax: [phone]  Primary Care Physician:  Miki Kins, FNP   Pre-Procedure History & Physical: HPI:  DERRA SHARTZER is a 48 y.o. female is here for an colonoscopy.   Past Medical History:  Diagnosis Date   Liver hemangioma    Lymphedema    Thyroid disease     Past Surgical History:  Procedure Laterality Date   CESAREAN SECTION  2014   CHOLECYSTECTOMY  2005   MANDIBLE FRACTURE SURGERY     MULTIPLE TOOTH EXTRACTIONS     TUBAL LIGATION  2014    Prior to Admission medications   Medication Sig Start Date End Date Taking? Authorizing Provider  albuterol (PROAIR HFA) 108 (90 Base) MCG/ACT inhaler Inhale 2 puffs into the lungs every 6 (six) hours as needed for wheezing or shortness of breath. 04/01/22   Orson Eva, NP  baclofen (LIORESAL) 10 MG tablet Take 1 tablet (10 mg total) by mouth 2 (two) times daily as needed for muscle spasms. 02/01/23   Miki Kins, FNP  gabapentin (NEURONTIN) 300 MG capsule TAKE 1 CAPSULE BY MOUTH NIGHTLY AT BEDTIME FOR 7 DAYS, INCREASE TO 2 CAPS AT BEDTIME FOR NEUROPATHY, APPT TIME AND LABS PLEASE 05/31/22   Margaretann Loveless, MD  levothyroxine (SYNTHROID) 125 MCG tablet Take 125 mcg by mouth daily before breakfast.    [provider]  nicotine (NICODERM CQ - DOSED IN MG/24 HOURS) 14 mg/24hr patch Place 1 patch (14 mg total) onto the skin daily. 03/28/23 05/23/23  Miki Kins, FNP  nicotine (NICODERM CQ - DOSED IN MG/24 HOURS) 21 mg/24hr patch Place 1 patch (21 mg total) onto the skin daily for 28 days. 02/28/23 03/28/23  Miki Kins, FNP  polyethylene glycol-electrolytes (NULYTELY) 420 g solution Take 4,000 mLs by mouth once. Patient not taking: Reported on 02/22/2023 02/14/23   [provider]  Semaglutide-Weight Management (WEGOVY) 1 MG/0.5ML SOAJ Inject 1 mg into the  skin once a week. 02/11/23   Miki Kins, FNP    Allergies as of 02/14/2023 - Review Complete 02/12/2023  Allergen Reaction Noted   Penicillin g Hives and Itching 11/08/2013   Amoxicillin  04/01/2022   Shellfish allergy Nausea And Vomiting 03/12/2016    Family History  Problem Relation Age of Onset   Diabetes Mother    Hypertension Father    Breast cancer Neg Hx     Social History   Socioeconomic History   Marital status: Married    Spouse name: Not on file   Number of children: Not on file   Years of education: Not on file   Highest education level: Not on file  Occupational History   Not on file  Tobacco Use   Smoking status: Every Day    Current packs/day: 1.00    Average packs/day: 1 pack/day for 10.0 years (10.0 ttl pk-yrs)    Types: Cigarettes    Passive exposure: Past   Smokeless tobacco: Never  Vaping Use   Vaping status: Never Used  Substance and Sexual Activity   Alcohol use: Yes    Comment: rare   Drug use: No   Sexual activity: Not on file  Other Topics Concern   Not on file  Social History Narrative   Not on file   Social Drivers of Health   Financial Resource Strain: Not on file  Food Insecurity: Not on file  Transportation Needs: Not on file  Physical Activity: Not on file  Stress: Not on file  Social Connections: Not on file  Intimate Partner Violence: Not on file    Review of Systems: See HPI, otherwise negative ROS  Physical Exam: LMP 02/07/2023 (Exact Date)  General:   Alert,  pleasant and cooperative in NAD Head:  Normocephalic and atraumatic. Neck:  Supple; no masses or thyromegaly. Lungs:  Clear throughout to auscultation, normal respiratory effort.    Heart:  +S1, +S2, Regular rate and rhythm, No edema. Abdomen:  Soft, nontender and nondistended. Normal bowel sounds, without guarding, and without rebound.   Neurologic:  Alert and  oriented x4;  grossly normal neurologically.  Impression/Plan: CHARMELLE SOH is here  for an colonoscopy to be performed for Screening colonoscopy average risk   Risks, benefits, limitations, and alternatives regarding  colonoscopy have been reviewed with the patient.  Questions have been answered.  All parties agreeable.   Wyline Mood, MD  03/09/2023, 9:31 AM

## 2023-03-09 NOTE — OR Nursing (Signed)
 Patient originally scheduled for Esophagogastroduodenoscopy and Colonoscopy; Esophagogastroduodenoscopy cancelled by MD.   Colonoscopy result is an incomplete procedure; unable to reach the cecum due to the abdominal wall hernia per MD.

## 2023-03-09 NOTE — Op Note (Signed)
 [facility] Gastroenterology Patient Name: Karen Hill Procedure Date: 03/09/2023 10:10 AM MRN: [id number] Account #: [id number] Date of Birth: [birth year] Admit Type: Outpatient Age: 48 Room: [facility] ENDO ROOM 3 Gender: Female Note Status: Finalized Instrument Name: Prentice Docker [id number] Procedure:             Colonoscopy Indications:           Screening for colorectal malignant neoplasm Providers:             Wyline Mood MD, MD Referring MD:          [place] Shirley (Referring MD) Medicines:             Monitored Anesthesia Care Complications:         No immediate complications. Procedure:             Pre-Anesthesia Assessment:                        - Prior to the procedure, a History and Physical was                         performed, and patient medications, allergies and                         sensitivities were reviewed. The patient's tolerance                         of previous anesthesia was reviewed.                        - The risks and benefits of the procedure and the                         sedation options and risks were discussed with the                         patient. All questions were answered and informed                         consent was obtained.                        - ASA Grade Assessment: II - A patient with mild                         systemic disease.                        After obtaining informed consent, the colonoscope was                         passed under direct vision. Throughout the procedure,                         the patient's blood pressure, pulse, and oxygen                         saturations were monitored continuously. The                         Colonoscope was introduced through  the anus with the                         intention of advancing to the cecum. The scope was                         advanced to the transverse colon before the procedure                         was aborted. Medications were given. The  colonoscopy                         was technically difficult and complex due to bowel                         stenosis. The patient tolerated the procedure well.                         The quality of the bowel preparation was fair. Findings:      The exam was otherwise without abnormality.      Could not reach the cecum due to herniated colon loops through abdominal       wall Impression:            - Preparation of the colon was fair.                        - The examination was otherwise normal.                        - No specimens collected. Recommendation:        - Discharge patient to home (with escort).                        - Resume previous diet.                        - Continue present medications.                        - Virtual colonoscopy recommended due to incomplete                         colonoscopy- can consider colonoscopy after repair of                         hernia as it is technically hard to reach cecum due to                         hernia Procedure Code(s):     --- Professional ---                        [phone], 53, Colonoscopy, flexible; diagnostic,                         including collection of specimen(s) by brushing or                         washing, when performed (separate procedure) Diagnosis Code(s):     ---  Professional ---                        Z12.11, Encounter for screening for malignant neoplasm                         of colon CPT copyright 2022 American Medical Association. All rights reserved. The codes documented in this report are preliminary and upon coder review may  be revised to meet current compliance requirements. Wyline Mood, MD Wyline Mood MD, MD 03/09/2023 10:31:40 AM This report has been signed electronically. Number of Addenda: 0 Note Initiated On: 03/09/2023 10:10 AM Scope Withdrawal Time: 0 hours 2 minutes 14 seconds  Total Procedure Duration: 0 hours 7 minutes 51 seconds  Estimated Blood Loss:  Estimated blood loss:  none.      [facility]

## 2023-03-09 NOTE — Anesthesia Postprocedure Evaluation (Signed)
 Anesthesia Post Note  Patient: Karen Hill  Procedure(s) Performed: COLONOSCOPY WITH PROPOFOL  Patient location during evaluation: Endoscopy Anesthesia Type: General Level of consciousness: awake and alert Pain management: pain level controlled Vital Signs Assessment: post-procedure vital signs reviewed and stable Respiratory status: spontaneous breathing, nonlabored ventilation, respiratory function stable and patient connected to nasal cannula oxygen Cardiovascular status: blood pressure returned to baseline and stable Postop Assessment: no apparent nausea or vomiting Anesthetic complications: no   There were no known notable events for this encounter.   Last Vitals:  Vitals:   03/09/23 1034 03/09/23 1044  BP: 98/60 113/85  Pulse: 92 84  Resp: 20 19  Temp: (!) 35.8 C   SpO2: 97% 100%    Last Pain:  Vitals:   03/09/23 1044  TempSrc:   PainSc: 0-No pain                 Cleda Mccreedy Estevon Fluke

## 2023-03-09 NOTE — Transfer of Care (Signed)
 Immediate Anesthesia Transfer of Care Note  Patient: Karen Hill  Procedure(s) Performed: COLONOSCOPY WITH PROPOFOL  Patient Location: PACU  Anesthesia Type:General  Level of Consciousness: awake  Airway & Oxygen Therapy: Patient Spontanous Breathing  Post-op Assessment: Report given to RN and Post -op Vital signs reviewed and stable  Post vital signs: Reviewed and stable  Last Vitals:  Vitals Value Taken Time  BP 98/60 03/09/23 1034  Temp    Pulse 92 03/09/23 1034  Resp 20 03/09/23 1034  SpO2 97 % 03/09/23 1034    Last Pain:  Vitals:   03/09/23 0955  TempSrc: Temporal  PainSc: 0-No pain         Complications: There were no known notable events for this encounter.

## 2023-03-10 ENCOUNTER — Encounter: Payer: Self-pay | Admitting: Gastroenterology

## 2023-03-15 NOTE — Progress Notes (Unsigned)
 Referring Physician:  No referring provider defined for this encounter.  Primary Physician:  Miki Kins, FNP  History of Present Illness: 03/15/2023 [name] has a history of hypothyroidism, prediabetes, lymphedema of lower extremities, and obesity.   Last seen by me on 02/22/23 for right sided back and leg pain. No lumbar imaging. CT of abdomen and pelvis on 02/07/23 showed DDD L5-S1 with modic type endplate changes.   She is here to review her lumbar MRI and xrays. She was to follow up with PCP regarding urinary leakage.   She is about the same. She continues with constant right sided LBP with right lateral/posterior leg pain to her foot. No left leg pain. She has numbness and tingling only in her right foot. Pain is worse with bending, standing still, and sitting. Some relief with laying flat. She has weakness in right leg.   She is taking baclofen and neurontin. These help very little.   She smokes 1 PPD x 15 years. She is working on quitting.   Bowel/Bladder Dysfunction: she notes some urinary leaking for over 1 month that has resolved. No bowel issues. No perineal numbness.   Conservative measures:  Physical therapy: has not participated in PT Multimodal medical therapy including regular antiinflammatories:  Gabapentin, Prednisone Injections: no epidural steroid injections  Past Surgery: no spinal surgeries  Karen Hill has no symptoms of cervical myelopathy.  The symptoms are causing a significant impact on the patient's life.   Review of Systems:  A 10 point review of systems is negative, except for the pertinent positives and negatives detailed in the HPI.  Past Medical History: Past Medical History:  Diagnosis Date   Liver hemangioma    Lymphedema    Thyroid disease     Past Surgical History: Past Surgical History:  Procedure Laterality Date   CESAREAN SECTION  2014   CHOLECYSTECTOMY  2005   COLONOSCOPY WITH PROPOFOL N/A 03/09/2023    Procedure: COLONOSCOPY WITH PROPOFOL;  Surgeon: Wyline Mood, MD;  Location: [facility] ENDOSCOPY;  Service: Gastroenterology;  Laterality: N/A;   MANDIBLE FRACTURE SURGERY     MULTIPLE TOOTH EXTRACTIONS     TUBAL LIGATION  2014    Allergies: Allergies as of 03/16/2023 - Review Complete 03/09/2023  Allergen Reaction Noted   Penicillin g Hives and Itching 11/08/2013   Amoxicillin  04/01/2022   Shellfish allergy Nausea And Vomiting 03/12/2016    Medications: Outpatient Encounter Medications as of 03/16/2023  Medication Sig   albuterol (PROAIR HFA) 108 (90 Base) MCG/ACT inhaler Inhale 2 puffs into the lungs every 6 (six) hours as needed for wheezing or shortness of breath.   baclofen (LIORESAL) 10 MG tablet Take 1 tablet (10 mg total) by mouth 2 (two) times daily as needed for muscle spasms.   gabapentin (NEURONTIN) 300 MG capsule TAKE 1 CAPSULE BY MOUTH NIGHTLY AT BEDTIME FOR 7 DAYS, INCREASE TO 2 CAPS AT BEDTIME FOR NEUROPATHY, APPT TIME AND LABS PLEASE   levothyroxine (SYNTHROID) 125 MCG tablet Take 125 mcg by mouth daily before breakfast.   [START ON 03/28/2023] nicotine (NICODERM CQ - DOSED IN MG/24 HOURS) 14 mg/24hr patch Place 1 patch (14 mg total) onto the skin daily.   nicotine (NICODERM CQ - DOSED IN MG/24 HOURS) 21 mg/24hr patch Place 1 patch (21 mg total) onto the skin daily for 28 days.   polyethylene glycol-electrolytes (NULYTELY) 420 g solution Take 4,000 mLs by mouth once. (Patient not taking: Reported on 02/22/2023)   Semaglutide-Weight Management [facility])  1 MG/0.5ML SOAJ Inject 1 mg into the skin once a week.   No facility-administered encounter medications on file as of 03/16/2023.    Social History: Social History   Tobacco Use   Smoking status: Every Day    Current packs/day: 1.00    Average packs/day: 1 pack/day for 10.0 years (10.0 ttl pk-yrs)    Types: Cigarettes    Passive exposure: Past   Smokeless tobacco: Never  Vaping Use   Vaping status: Former  Substance Use  Topics   Alcohol use: Yes    Comment: rare   Drug use: No    Family Medical History: Family History  Problem Relation Age of Onset   Diabetes Mother    Hypertension Father    Breast cancer Neg Hx     Physical Examination: There were no vitals filed for this visit.    Awake, alert, oriented to person, place, and time.  Speech is clear and fluent. Fund of knowledge is appropriate.   Cranial Nerves: Pupils equal round and reactive to light.  Facial tone is symmetric.    No posterior lumbar tenderness.   No abnormal lesions on exposed skin.   Strength: Side Iliopsoas Quads Hamstring PF DF EHL  R 5 5 5 5 5 5   L 5 5 5 5 5 5    Reflexes are 2+ and symmetric at the patella and achilles.    Clonus is not present.   Bilateral lower extremity sensation is intact to light touch.     Has known lymphedema in both legs.   Gait is slow with slight limp favoring right leg.    Medical Decision Making  Imaging: MRI of lumbar spine dated 02/28/23:  FINDINGS: Segmentation:  Normal.   Alignment:  Stable without significant listhesis.   Vertebrae: Increased degenerative endplate marrow changes at L5-S1 eccentric to the left. No marrow edema. No suspicious lesion. There is a vertebral body hemangioma at L3.   Conus medullaris and cauda equina: Conus extends to the L1 level. Conus and cauda equina appear normal.   Paraspinal and other soft tissues: Unremarkable.   Disc levels:   L1-L2:  No stenosis.   L2-L3:  No stenosis.   L3-L4:  No stenosis.   L4-L5: Disc desiccation without disc height loss. Disc bulge with right foraminal annular fissure. No canal stenosis. Mild foraminal stenosis.   L5-S1: Disc desiccation with increased disc height loss. Disc bulge with endplate osteophytic ridging and superimposed right subarticular disc protrusion. No canal stenosis. Effacement of the right subarticular recess with compression of traversing S1 nerve root. Mild right and mild to  moderate left foraminal stenosis.   IMPRESSION: Lower lumbar degenerative changes with progression since remote 2015 study. Most notably, there is a disc herniation at L5-S1 compressing the traversing right S1 nerve root.     Electronically Signed   By: Guadlupe Spanish M.D.   On: 03/10/2023 16:07   Lumbar xrays dated 02/28/23:  FINDINGS: Normal anatomic alignment. No evidence for active or static listhesis. Preservation of the vertebral body heights. L4-5 and L5-S1 degenerative disc disease. Lower lumbar spine facet degenerative changes.   IMPRESSION: Lower lumbar spine degenerative disc and facet disease.     Electronically Signed   By: Annia Belt M.D.   On: 03/15/2023 13:19  I have personally reviewed the images and agree with the above interpretation. I think she has a slight slip at L5-S1.   Assessment and Plan: Ms. Gullatt continues with constant right sided LBP with right  lateral/posterior leg pain to her foot. No left leg pain. She has numbness and tingling only in her right foot. She has weakness in right leg.   She has known DDD L4-S1, worse at L5-S1 with slight slip. Alos with mild bilateral foraminal stenosis L4-L5 and right disc L5-S1 that compresses S1 nerve with mild right and mild/moderate left foraminal stenosis.   LBP likely due to DDD. Right leg pain likely from disc at L5-S1.   Treatment options discussed with patient and following plan made:   - Referral to pain management ([facility]) to discuss possible lumbar injections.  - Discussed lumbar PT. She declines for now.  - She is on waiting list for PT at [facility] for her lymphedema. Message to her PCP letting her know that [facility] in [place] takes medicaid.  - She will continue to work on smoking cessation and weight loss. She has been doing this to have hernia repair.  - She understands that she may need to revisit PT if no improvement with injections.  - Follow up with me in 6-8 weeks and prn.    I spent a total of 25 minutes in face-to-face and non-face-to-face activities related to this patient's care today including review of outside records, review of imaging, review of symptoms, physical exam, discussion of differential diagnosis, discussion of treatment options, and documentation.   Drake Leach PA-C Dept. of Neurosurgery

## 2023-03-16 ENCOUNTER — Encounter: Payer: Self-pay | Admitting: Orthopedic Surgery

## 2023-03-16 ENCOUNTER — Ambulatory Visit: Admitting: Orthopedic Surgery

## 2023-03-16 VITALS — BP 130/78 | Ht 69.0 in | Wt 253.0 lb

## 2023-03-16 DIAGNOSIS — M47816 Spondylosis without myelopathy or radiculopathy, lumbar region: Secondary | ICD-10-CM

## 2023-03-16 DIAGNOSIS — M48061 Spinal stenosis, lumbar region without neurogenic claudication: Secondary | ICD-10-CM

## 2023-03-16 DIAGNOSIS — M5126 Other intervertebral disc displacement, lumbar region: Secondary | ICD-10-CM | POA: Diagnosis not present

## 2023-03-16 DIAGNOSIS — M4726 Other spondylosis with radiculopathy, lumbar region: Secondary | ICD-10-CM

## 2023-03-16 DIAGNOSIS — M51362 Other intervertebral disc degeneration, lumbar region with discogenic back pain and lower extremity pain: Secondary | ICD-10-CM

## 2023-03-16 DIAGNOSIS — M5416 Radiculopathy, lumbar region: Secondary | ICD-10-CM

## 2023-03-16 NOTE — Patient Instructions (Signed)
 It was so nice to see you today. Thank you so much for coming in.    You have some wear and tear in your back (arthritis) along with a disc herniation on the right at L5-S1.   I want you to see pain management here in [place] (Dr. Cherylann Ratel) to discuss possible lumbar injections. They should call you to schedule an appointment or you can call them at [phone].   I will send Marchelle Folks a message about PT at [facility] in [place].   Keep working on quitting smoking- you are doing great!  I will see you back in 6-8 weeks. Please do not hesitate to call if you have any questions or concerns. You can also message me in MyChart.   Drake Leach PA-C [phone]     The physicians and staff at [facility] at [facility] are committed to providing excellent care. You may receive a survey asking for feedback about your experience at our office. We value you your feedback and appreciate you taking the time to to fill it out. The [facility] leadership team is also available to discuss your experience in person, feel free to contact us [phone].

## 2023-03-21 ENCOUNTER — Ambulatory Visit (INDEPENDENT_AMBULATORY_CARE_PROVIDER_SITE_OTHER): Payer: Medicaid Other | Admitting: Surgery

## 2023-03-21 ENCOUNTER — Encounter: Payer: Self-pay | Admitting: Surgery

## 2023-03-21 VITALS — BP 116/79 | HR 89 | Temp 98.3°F | Ht 69.0 in | Wt 248.8 lb

## 2023-03-21 DIAGNOSIS — K436 Other and unspecified ventral hernia with obstruction, without gangrene: Secondary | ICD-10-CM

## 2023-03-21 NOTE — Patient Instructions (Signed)
 Belly Hernia (Ventral Hernia): What to Know  A ventral hernia is a bulge of tissue from inside the belly that pushes through a weak area of the belly. Sometimes, the bulge may have tissue from the small intestine or the large intestine. Ventral hernias do not go away without surgery. There are several types of ventral hernias. You may have: A hernia at a place where surgery was done (incisional hernia). A hernia just above the belly button (epigastric or paraumbilical hernia) or at the belly button (umbilical hernia). These can happen because of heavy lifting or straining. A hernia that comes and goes (reducible hernia). It may be visible only when you lift or strain. This type of hernia can be pushed back into the belly. A hernia that traps belly tissue inside the hernia (incarcerated hernia). This hernia cannot be pushed back into the belly. A hernia that cuts off blood flow to the tissues inside the hernia (strangulation hernia). The tissues can start to die if this happens. This is a very painful bulge that cannot be pushed back into the belly. This type of hernia is a medical emergency. What are the causes? This condition happens when tissue in the belly pushes on a weak area in the muscles. What increases the risk? You're more likely to have this condition if: You are 60 years or older. You had belly surgery in the past. This is common if there was an infection after surgery. You had an injury to the belly. You often lift or push heavy objects. You have been pregnant several times. You have long-term (chronic) health conditions that put pressure in your belly. These include: Being overweight or obese. Having a buildup of fluid inside your belly (ascites). You throw up or cough over and over again. You have trouble pooping (constipation). You strain to poop or pee. What are the signs or symptoms? The only symptom of a ventral hernia may be a painless bulge in the belly.  Reducible  hernia may be visible only when you strain, cough, or lift. Other symptoms may include: Dull pain. A feeling of pressure. Symptoms of an incarcerated hernia may include: Tenderness at hernia site. Bloating. Throwing up or feeling like you may throw up. Trouble pooping or no pooping at all. Symptoms of a strangulated hernia may include: More pain. Throwing up or feeling like you may throw up. Pain when pressing on the hernia. The skin over the hernia turning red or purple. Trouble pooping. Blood in the poop. How is this diagnosed? This condition may be diagnosed based on: Your symptoms and medical history. A physical exam. You may be asked to cough or strain while standing. These actions increase the pressure inside your belly and force the hernia through the opening in your belly. Your health care provider may try to reduce the hernia by gently pushing the hernia back in. Imaging studies, such as an ultrasound or CT scan. How is this treated? This condition is treated with surgery. If you have a strangulated hernia, surgery is done as soon as possible. If your hernia is small and not incarcerated, you may be asked to lose some weight before surgery. Follow these instructions at home: Eat and drink only as you've been told. Lose weight, if told by your provider. You may have to avoid lifting. Ask your provider how much you can safely lift. Avoid activities that increase pressure on your hernia. Take your medicines only as told. You may need to take steps to help treat  or prevent trouble pooping (constipation), such as: Taking medicines to help you poop. Eating foods high in fiber, like beans, whole grains, and fresh fruits and vegetables. Drinking more fluids as told. Ask your provider if it's safe to drive or use machines while taking your medicine. Contact a health care provider if: Your hernia gets larger or feels hard. Your hernia becomes painful. Get help right away if: Your  hernia becomes very painful. You have pain along with any of these: Changes in skin color in the area of the hernia. Feeling like throwing up. Throwing up. Fever. These symptoms may be an emergency. Call 911 right away. Do not wait to see if the symptoms will go away. Do not drive yourself to the hospital. This information is not intended to replace advice given to you by your health care provider. Make sure you discuss any questions you have with your health care provider. Document Revised: 07/06/2022 Document Reviewed: 07/06/2022 Elsevier Patient Education  2024 [employer].

## 2023-03-22 ENCOUNTER — Encounter: Payer: Self-pay | Admitting: Surgery

## 2023-03-22 NOTE — Progress Notes (Signed)
 Outpatient Surgical Follow Up  03/22/2023  Karen Hill is an 48 y.o. female.   Chief Complaint  Patient presents with   Follow-up    Ventral hernia    HPI:  Karen Hill is a 48 y.o. female seen in follow up for symptomatic and worsening ventral hernia.  She did have a history of cholecystectomy as well as C-section in the past and tubal ligation.  She thinks that this might be a recurrent. I saw her over 5 years ago and at that time I asked her to optimize her weight and also participate in a smoking cessation program.  She did lose some weight but then she lost follow-up.  Recently she has been having more abdominal pain that is moderate intermittent and worsening with certain meals.  The hernia has progressed to the point that now is not reducible.  She does have daily symptoms.  She continues to smoke but is cutting back.  He does have some chronic pulmonary issues.  He did have a recent CT scan that have personally reviewed showing evidence of a large ventral hernia with some loss of domain and chronically incarcerated bowel Has been started on GLP-1 inhibitor with good results and she has lost about 40 pounds, she has objectively loose about 10 lbs since my last visit, SHe also came in with her husband today as I asked her last time. She does have some chronic pain issues and will see pain management team in the next few weeks. 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   Past Medical History:  Diagnosis Date   Liver hemangioma    Lymphedema    Thyroid disease     Past Surgical History:  Procedure Laterality Date   CESAREAN SECTION  2014   CHOLECYSTECTOMY  2005   COLONOSCOPY WITH PROPOFOL N/A 03/09/2023   Procedure: COLONOSCOPY WITH PROPOFOL;  Surgeon: Wyline Mood, MD;  Location: [facility] ENDOSCOPY;  Service: Gastroenterology;  Laterality: N/A;   MANDIBLE FRACTURE SURGERY     MULTIPLE TOOTH EXTRACTIONS     TUBAL LIGATION  2014    Family History  Problem  Relation Age of Onset   Diabetes Mother    Hypertension Father    Breast cancer Neg Hx     Social History:  reports that she has been smoking cigarettes. She has a 10 pack-year smoking history. She has been exposed to tobacco smoke. She has never used smokeless tobacco. She reports current alcohol use. She reports that she does not use drugs.  Allergies:  Allergies  Allergen Reactions   Penicillin G Hives and Itching   Amoxicillin    Shellfish Allergy Nausea And Vomiting    Medications reviewed.    ROS Full ROS performed and is otherwise negative other than what is stated in HPI   BP 116/79   Pulse 89   Temp 98.3 F (36.8 C) (Oral)   Ht 5\' 9"  (1.753 m)   Wt 248 lb 12.8 oz (112.9 kg)   SpO2 94%   BMI 36.74 kg/m   Physical Exam CONSTITUTIONAL: NAD BMI 41. EYES: Pupils are equal, round, and reactive to light, Sclera are non-icteric. EARS, NOSE, MOUTH AND THROAT: The oropharynx is clear. The oral mucosa is pink and moist. Hearing is intact to voice. LYMPH NODES:  Lymph nodes in the neck are normal. RESPIRATORY:  Lungs are clear. There is normal respiratory effort, with equal breath sounds bilaterally, and without pathologic use of accessory muscles. CARDIOVASCULAR: Heart is  regular without murmurs, gallops, or rubs. GI: The abdomen is  soft, large chronically incarcerated ventral hernia between umbilicus and xiphoid. Tender to palpation. No peritonitis, there is loss of domain. There are no palpable masses. There is no hepatosplenomegaly. There are normal bowel sounds GU: Rectal deferred.   MUSCULOSKELETAL: Normal muscle strength and tone. No cyanosis or edema.   SKIN: Turgor is good and there are no pathologic skin lesions or ulcers. NEUROLOGIC: Motor and sensation is grossly normal. Cranial nerves are grossly intact. PSYCH:  Oriented to person, place and time. Affect is normal.   Assessment/Plan: 48 year old female with recurrent chronically incarcerated ventral hernia  with loss of domain.  She does have multiple risk factor including a BMI now improved and at 36 as well as active smoker.  I had an extensive discussion with the patient and her husband about her disease process.  Unfortunately her symptoms are crescendo to the point that her hernia is currently not reducible. Currently there is no signs of strangulation. SHe has lost about 40 pounds since she has been on GLP-1 inhibitor. Counseled her again to continue weight optimization and smoking cessation. I do think there is a value in getting her evaluated my pain medicine team to develop a future plan regarding her chronic back issues  I Do think that she is between a rock and a hard place and we will need to perform her surgery before she develops a catastrophic complication..   I did have a very frank discussion with the pt and her husband.  In my experience these are very difficult cases because we are unable to completely optimize them in the real world and that usually they present with a strangulation that significantly compromises the abdominal wall and also increase tremendously the duration of complications. I will see her back in a couple of months to allow more time for weight optimization and have evaluation by pain medicine. She is certainly at increased risk of developing perioperative complication including recurrence and infection.   We could potentially perform a robotic repair component separation release.  Sometimes these are very challenging cases that may require open intervention. Again I counseled her about smoking cessation  and she is going to try to do her best. I Do give her credit that she has comply for both of my request of weight loss and also involving her husband. Note that I spent more than 40 minutes in this encounter including personally reviewing imaging studies, coordinating her care, placing orders and performing documentation    Sterling Big, MD [facility] General Surgeon

## 2023-03-24 ENCOUNTER — Encounter: Payer: Self-pay | Admitting: Family

## 2023-03-25 ENCOUNTER — Other Ambulatory Visit: Payer: Self-pay | Admitting: Family

## 2023-03-31 ENCOUNTER — Ambulatory Visit: Payer: Medicaid Other | Admitting: Physician Assistant

## 2023-04-01 NOTE — Telephone Encounter (Signed)
 Referral has been sent.

## 2023-04-03 NOTE — Assessment & Plan Note (Signed)
 Sending referral to Neurosurgery. Will defer to them for futher treatment decisions

## 2023-04-03 NOTE — Progress Notes (Signed)
 Established Patient Office Visit  Subjective:  Patient ID: Karen Hill, female    DOB: [birth year]  Age: 48 y.o. MRN: [id number]  Chief Complaint  Patient presents with   Tailbone Pain    Patient is here today for with severe pain in her tailbone and lower back.  She does have a history of sciatica, thinks this might be related.   No other concerns today.   Past Medical History:  Diagnosis Date   Liver hemangioma    Lymphedema    Thyroid disease     Past Surgical History:  Procedure Laterality Date   CESAREAN SECTION  2014   CHOLECYSTECTOMY  2005   COLONOSCOPY WITH PROPOFOL N/A 03/09/2023   Procedure: COLONOSCOPY WITH PROPOFOL;  Surgeon: Wyline Mood, MD;  Location: [facility] ENDOSCOPY;  Service: Gastroenterology;  Laterality: N/A;   MANDIBLE FRACTURE SURGERY     MULTIPLE TOOTH EXTRACTIONS     TUBAL LIGATION  2014    Social History   Socioeconomic History   Marital status: Married    Spouse name: Not on file   Number of children: Not on file   Years of education: Not on file   Highest education level: Not on file  Occupational History   Not on file  Tobacco Use   Smoking status: Every Day    Current packs/day: 1.00    Average packs/day: 1 pack/day for 10.0 years (10.0 ttl pk-yrs)    Types: Cigarettes    Passive exposure: Past   Smokeless tobacco: Never  Vaping Use   Vaping status: Former  Substance and Sexual Activity   Alcohol use: Yes    Comment: rare   Drug use: No   Sexual activity: Not on file  Other Topics Concern   Not on file  Social History Narrative   Not on file   Social Drivers of Health   Financial Resource Strain: Not on file  Food Insecurity: Not on file  Transportation Needs: Not on file  Physical Activity: Not on file  Stress: Not on file  Social Connections: Not on file  Intimate Partner Violence: Not on file    Family History  Problem Relation Age of Onset   Diabetes Mother    Hypertension Father    Breast cancer Neg Hx      Allergies  Allergen Reactions   Penicillin G Hives and Itching   Amoxicillin    Shellfish Allergy Nausea And Vomiting    Review of Systems  Musculoskeletal:  Positive for back pain.  All other systems reviewed and are negative.      Objective:   BP 104/70   Pulse (!) 107   Ht 5\' 6"  (1.676 m)   Wt 262 lb (118.8 kg)   LMP 12/26/2022   SpO2 98%   BMI 42.29 kg/m   Vitals:   02/01/23 1140  BP: 104/70  Pulse: (!) 107  Height: 5\' 6"  (1.676 m)  Weight: 262 lb (118.8 kg)  SpO2: 98%  BMI (Calculated): 42.31    Physical Exam Vitals and nursing note reviewed.  Constitutional:      Appearance: Normal appearance. She is normal weight.  HENT:     Head: Normocephalic.  Eyes:     Extraocular Movements: Extraocular movements intact.     Conjunctiva/sclera: Conjunctivae normal.     Pupils: Pupils are equal, round, and reactive to light.  Cardiovascular:     Rate and Rhythm: Normal rate.  Pulmonary:     Effort: Pulmonary effort is normal.  Neurological:     General: No focal deficit present.     Mental Status: She is alert and oriented to person, place, and time. Mental status is at baseline.  Psychiatric:        Mood and Affect: Mood normal.        Behavior: Behavior normal.        Thought Content: Thought content normal.        Judgment: Judgment normal.      Results for orders placed or performed in visit on 02/01/23  Comprehensive metabolic panel  Result Value Ref Range   Glucose 97 70 - 99 mg/dL   BUN 8 6 - 24 mg/dL   Creatinine, Ser 1.61 0.57 - 1.00 mg/dL   eGFR 85 >09 UE/AVW/0.98   BUN/Creatinine Ratio 9 9 - 23   Sodium 141 134 - 144 mmol/L   Potassium 4.5 3.5 - 5.2 mmol/L   Chloride 102 96 - 106 mmol/L   CO2 23 20 - 29 mmol/L   Calcium 9.6 8.7 - 10.2 mg/dL   Total Protein 7.0 6.0 - 8.5 g/dL   Albumin 4.0 3.9 - 4.9 g/dL   Globulin, Total 3.0 1.5 - 4.5 g/dL   Bilirubin Total 0.5 0.0 - 1.2 mg/dL   Alkaline Phosphatase 109 44 - 121 IU/L   AST 17 0  - 40 IU/L   ALT 9 0 - 32 IU/L  TSH + free T4  Result Value Ref Range   TSH 0.124 (L) 0.450 - 4.500 uIU/mL   Free T4 1.27 0.82 - 1.77 ng/dL  hCG, serum, qualitative  Result Value Ref Range   hCG,Beta Subunit,Qual,Serum Negative Negative <6 mIU/mL    Recent Results (from the past 2160 hours)  Comprehensive metabolic panel     Status: None   Collection Time: 02/01/23  1:33 PM  Result Value Ref Range   Glucose 97 70 - 99 mg/dL   BUN 8 6 - 24 mg/dL   Creatinine, Ser 1.19 0.57 - 1.00 mg/dL   eGFR 85 >14 NW/GNF/6.21   BUN/Creatinine Ratio 9 9 - 23   Sodium 141 134 - 144 mmol/L   Potassium 4.5 3.5 - 5.2 mmol/L   Chloride 102 96 - 106 mmol/L   CO2 23 20 - 29 mmol/L   Calcium 9.6 8.7 - 10.2 mg/dL   Total Protein 7.0 6.0 - 8.5 g/dL   Albumin 4.0 3.9 - 4.9 g/dL   Globulin, Total 3.0 1.5 - 4.5 g/dL   Bilirubin Total 0.5 0.0 - 1.2 mg/dL   Alkaline Phosphatase 109 44 - 121 IU/L   AST 17 0 - 40 IU/L   ALT 9 0 - 32 IU/L  TSH + free T4     Status: Abnormal   Collection Time: 02/01/23  1:33 PM  Result Value Ref Range   TSH 0.124 (L) 0.450 - 4.500 uIU/mL   Free T4 1.27 0.82 - 1.77 ng/dL  hCG, serum, qualitative     Status: None   Collection Time: 02/01/23  1:33 PM  Result Value Ref Range   hCG,Beta Subunit,Qual,Serum Negative Negative <6 mIU/mL  Lipid panel     Status: Abnormal   Collection Time: 02/28/23  2:00 PM  Result Value Ref Range   Cholesterol, Total 153 100 - 199 mg/dL   Triglycerides 308 0 - 149 mg/dL   HDL 31 (L) >65 mg/dL   VLDL Cholesterol Cal 21 5 - 40 mg/dL   LDL Chol Calc (NIH) 784 (H) 0 - 99 mg/dL  Chol/HDL Ratio 4.9 (H) 0.0 - 4.4 ratio    Comment:                                   T. Chol/HDL Ratio                                             Men  Women                               1/2 Avg.Risk  3.4    3.3                                   Avg.Risk  5.0    4.4                                2X Avg.Risk  9.6    7.1                                3X Avg.Risk 23.4    11.0   VITAMIN D 25 Hydroxy (Vit-D Deficiency, Fractures)     Status: Abnormal   Collection Time: 02/28/23  2:00 PM  Result Value Ref Range   Vit D, 25-Hydroxy 22.3 (L) 30.0 - 100.0 ng/mL    Comment: Vitamin D deficiency has been defined by the Institute of Medicine and an Endocrine Society practice guideline as a level of serum 25-OH vitamin D less than 20 ng/mL (1,2). The Endocrine Society went on to further define vitamin D insufficiency as a level between 21 and 29 ng/mL (2). 1. IOM (Institute of Medicine). 2010. Dietary reference    intakes for calcium and D. [place]: The    [employer]. 2. Holick MF, Binkley [place], Bischoff-Ferrari HA, et al.    Evaluation, treatment, and prevention of vitamin D    deficiency: an Endocrine Society clinical practice    guideline. JCEM. 2011 Jul; 96(7):1911-30.   CMP14+EGFR     Status: Abnormal   Collection Time: 02/28/23  2:00 PM  Result Value Ref Range   Glucose 83 70 - 99 mg/dL   BUN 6 6 - 24 mg/dL   Creatinine, Ser 4.09 0.57 - 1.00 mg/dL   eGFR 811 >91 YN/WGN/5.62   BUN/Creatinine Ratio 9 9 - 23   Sodium 144 134 - 144 mmol/L   Potassium 3.7 3.5 - 5.2 mmol/L   Chloride 106 96 - 106 mmol/L   CO2 24 20 - 29 mmol/L   Calcium 8.7 8.7 - 10.2 mg/dL   Total Protein 6.2 6.0 - 8.5 g/dL   Albumin 3.6 (L) 3.9 - 4.9 g/dL   Globulin, Total 2.6 1.5 - 4.5 g/dL   Bilirubin Total 0.7 0.0 - 1.2 mg/dL   Alkaline Phosphatase 101 44 - 121 IU/L   AST 11 0 - 40 IU/L   ALT 6 0 - 32 IU/L  Hemoglobin A1c     Status: None   Collection Time: 02/28/23  2:00 PM  Result Value Ref Range   Hgb A1c MFr Bld 4.9 4.8 - 5.6 %  Comment:          Prediabetes: 5.7 - 6.4          Diabetes: >6.4          Glycemic control for adults with diabetes: <7.0    Est. average glucose Bld gHb Est-mCnc 94 mg/dL  Vitamin Q65     Status: Abnormal   Collection Time: 02/28/23  2:00 PM  Result Value Ref Range   Vitamin B-12 154 (L) 232 - 1,245 pg/mL  TSH+T4F+T3Free      Status: Abnormal   Collection Time: 02/28/23  2:00 PM  Result Value Ref Range   TSH 0.034 (L) 0.450 - 4.500 uIU/mL   T3, Free 3.9 2.0 - 4.4 pg/mL   Free T4 1.71 0.82 - 1.77 ng/dL  Iron, TIBC and Ferritin Panel     Status: Abnormal   Collection Time: 02/28/23  2:00 PM  Result Value Ref Range   Total Iron Binding Capacity 327 250 - 450 ug/dL   UIBC 784 696 - 295 ug/dL   Iron 38 27 - 284 ug/dL   Iron Saturation 12 (L) 15 - 55 %   Ferritin 20 15 - 150 ng/mL  CBC with Diff     Status: None   Collection Time: 02/28/23  2:00 PM  Result Value Ref Range   WBC 9.2 3.4 - 10.8 x10E3/uL   RBC 4.15 3.77 - 5.28 x10E6/uL   Hemoglobin 12.7 11.1 - 15.9 g/dL   Hematocrit 13.2 44.0 - 46.6 %   MCV 95 79 - 97 fL   MCH 30.6 26.6 - 33.0 pg   MCHC 32.3 31.5 - 35.7 g/dL   RDW 10.2 72.5 - 36.6 %   Platelets 232 150 - 450 x10E3/uL   Neutrophils 69 Not Estab. %   Lymphs 25 Not Estab. %   Monocytes 4 Not Estab. %   Eos 2 Not Estab. %   Basos 0 Not Estab. %   Neutrophils Absolute 6.3 1.4 - 7.0 x10E3/uL   Lymphocytes Absolute 2.3 0.7 - 3.1 x10E3/uL   Monocytes Absolute 0.3 0.1 - 0.9 x10E3/uL   EOS (ABSOLUTE) 0.2 0.0 - 0.4 x10E3/uL   Basophils Absolute 0.0 0.0 - 0.2 x10E3/uL   Immature Granulocytes 0 Not Estab. %   Immature Grans (Abs) 0.0 0.0 - 0.1 x10E3/uL  Hepatitis C Ab reflex to Quant PCR     Status: None   Collection Time: 02/28/23  2:00 PM  Result Value Ref Range   HCV Ab Non Reactive Non Reactive  Interpretation:     Status: None   Collection Time: 02/28/23  2:00 PM  Result Value Ref Range   HCV Interp 1: Comment     Comment: Not infected with HCV unless early or acute infection is suspected (which may be delayed in an immunocompromised individual), or other evidence exists to indicate HCV infection.        Assessment & Plan:   Problem List Items Addressed This Visit       Nervous and Auditory   Sciatica of right side   Sending referral to Neurosurgery. Will defer to them for  futher treatment decisions       Relevant Orders   Ambulatory referral to Neurosurgery   Other Visit Diagnoses       Coccygeal pain    -  Primary   Getting x-ray of Coccyx/sacrum today.  Will call pt. with results. when available.   Relevant Orders   DG Sacrum/Coccyx (Completed)  Return in about 10 days (around 02/11/2023).   Total time spent: 20 minutes  Miki Kins, FNP  02/01/2023   This document may have been prepared by [facility] Voice Recognition software and as such may include unintentional dictation errors.

## 2023-04-06 ENCOUNTER — Other Ambulatory Visit: Payer: Self-pay | Admitting: Family

## 2023-04-06 MED ORDER — CYANOCOBALAMIN 1000 MCG/ML IJ SOLN: 1000.0000 ug | INTRAMUSCULAR | 0 refills | Status: DC

## 2023-04-06 NOTE — Telephone Encounter (Signed)
 Per Marchelle Folks verbal, on patient's last lab results her B12 is VERY low - she needs to start B12 injections every 2 weeks. Her vit D is low also. We may need to adjust her thyroid meds, does she see endocrinology?   Spoke with patient and informed her of results. She is coming for her first B12 injection on Monday, 3/31. Will get Vit D OTC. She does not see endo. Please adjust levothyroxine accordingly and send to [facility] [place].

## 2023-04-10 ENCOUNTER — Other Ambulatory Visit: Payer: Self-pay | Admitting: Cardiology

## 2023-04-10 DIAGNOSIS — E039 Hypothyroidism, unspecified: Secondary | ICD-10-CM

## 2023-04-11 ENCOUNTER — Ambulatory Visit (INDEPENDENT_AMBULATORY_CARE_PROVIDER_SITE_OTHER): Admitting: Family

## 2023-04-11 DIAGNOSIS — E538 Deficiency of other specified B group vitamins: Secondary | ICD-10-CM

## 2023-04-11 MED ORDER — CYANOCOBALAMIN 1000 MCG/ML IJ SOLN
1000.0000 ug | Freq: Once | INTRAMUSCULAR | Status: AC
  Administered 2023-04-11: 1000 ug via INTRAMUSCULAR

## 2023-04-11 NOTE — Progress Notes (Signed)
   CHIEF COMPLAINT  B12 Shot     REASON FOR VISIT  B12 Injection     ASSESSMENT  B12 Deficiency, Unspecified     PLAN  Diagnoses and all orders for this visit:  B12 deficiency due to diet -     cyanocobalamin (VITAMIN B12) injection 1,000 mcg     Pt. given B12 injection in clinic.  Return for next injection per provider instructions.   Total time spent: 5 minutes  Miki Kins, FNP  04/11/2023

## 2023-04-12 ENCOUNTER — Ambulatory Visit: Admitting: Student in an Organized Health Care Education/Training Program

## 2023-04-12 ENCOUNTER — Telehealth: Payer: Self-pay

## 2023-04-12 DIAGNOSIS — Z1211 Encounter for screening for malignant neoplasm of colon: Secondary | ICD-10-CM

## 2023-04-12 NOTE — Telephone Encounter (Signed)
 Spoke with patient- order placed for virtual colonoscopy- order, office notes and colonoscopy report-faxed to [phone].

## 2023-04-21 ENCOUNTER — Ambulatory Visit
Attending: Student in an Organized Health Care Education/Training Program | Admitting: Student in an Organized Health Care Education/Training Program

## 2023-04-21 ENCOUNTER — Encounter: Payer: Self-pay | Admitting: Student in an Organized Health Care Education/Training Program

## 2023-04-21 VITALS — BP 133/65 | HR 103 | Temp 97.3°F | Ht 66.0 in | Wt 248.0 lb

## 2023-04-21 DIAGNOSIS — M5126 Other intervertebral disc displacement, lumbar region: Secondary | ICD-10-CM | POA: Diagnosis not present

## 2023-04-21 DIAGNOSIS — G894 Chronic pain syndrome: Secondary | ICD-10-CM | POA: Diagnosis present

## 2023-04-21 DIAGNOSIS — M5116 Intervertebral disc disorders with radiculopathy, lumbar region: Secondary | ICD-10-CM

## 2023-04-21 DIAGNOSIS — M5416 Radiculopathy, lumbar region: Secondary | ICD-10-CM | POA: Insufficient documentation

## 2023-04-21 DIAGNOSIS — M47816 Spondylosis without myelopathy or radiculopathy, lumbar region: Secondary | ICD-10-CM | POA: Diagnosis present

## 2023-04-21 DIAGNOSIS — G8929 Other chronic pain: Secondary | ICD-10-CM | POA: Insufficient documentation

## 2023-04-21 NOTE — Patient Instructions (Signed)

## 2023-04-21 NOTE — Addendum Note (Signed)
 Addended by: Edward Jolly on: 04/21/2023 12:21 PM   Modules accepted: Orders

## 2023-04-21 NOTE — Progress Notes (Addendum)
 PROVIDER NOTE: Interpretation of information contained herein should be left to medically-trained personnel. Specific patient instructions are provided elsewhere under "Patient Instructions" section of medical record. This document was created in part using AI and STT-dictation technology, any transcriptional errors that may result from this process are unintentional.  Patient: Karen Hill  Service: E/M Encounter  PCP: Miki Kins, FNP  DOB: 05/01/1975  DOS: 04/21/2023  Provider: Edward Jolly, MD  MRN: [id number]  Delivery: Face-to-face  Specialty: Interventional Pain Management  Type: New Patient  Setting: Ambulatory outpatient facility  Specialty designation: 09  Referring Prov.: Drake Leach, PA-C  Location: Outpatient office facility     Primary Reason(s) for Visit: Encounter for initial evaluation of one or more chronic problems (new to examiner) potentially causing chronic pain, and posing a threat to normal musculoskeletal function. (Level of risk: High) CC: Back Pain (Right, lower)  HPI  Karen Hill is a 48 y.o. year old, female patient, who comes for the first time to our practice referred by Drake Leach, PA-C for our initial evaluation of her chronic pain. She has Hypothyroidism, adult; Prediabetes; Other fatigue; Acute sinusitis; Abdominal hernia without obstruction and without gangrene; Lymphedema of both lower extremities; Obesity, morbid (HCC); Sciatica of right side; Colon cancer screening; Chronic radicular lumbar pain; Lumbar disc herniation with radiculopathy (RIGHT); and Chronic pain syndrome on their problem list. Today she comes in for evaluation of her Back Pain (Right, lower)  Pain Assessment: Location: Right, Lower Back Radiating: right leg to the foot, with numbness and tingling in the foot, left toes Onset: More than a month ago Duration: Chronic pain Quality: Burning, Dull, Tingling Severity: 5 /10 (subjective, self-reported pain score)  Effect on ADL: rest,  Baclofen, Gabapentin Timing: Constant Modifying factors: difficulty performing daily activities BP: 133/65  HR: (!) 103  Onset and Duration: Gradual Cause of pain:  bulged disc Severity: Getting worse, NAS-11 at its worse: 10/10, NAS-11 at its best: 5/10, NAS-11 now: 5/10, and NAS-11 on the average: 7/10 Timing: Not influenced by the time of the day, During activity or exercise, and After activity or exercise Aggravating Factors: Bending, Climbing, Kneeling, Lifiting, Motion, Prolonged sitting, Prolonged standing, Squatting, Stooping , Twisting, Walking, Walking uphill, Walking downhill, and Working Alleviating Factors: Lying down, Medications, and Sleeping Associated Problems: Constipation, Depression, Nausea, Numbness, Spasms, Swelling, Tingling, Vomiting , and Pain that does not allow patient to sleep Quality of Pain: Aching, Burning, Constant, Pressure-like, Pulsating, Shooting, and Tingling Previous Examinations or Tests: CT scan, MRI scan, Neurological evaluation, and Chiropractic evaluation Previous Treatments: The patient denies none noted  Ms. Fahey is being evaluated for possible interventional pain management therapies for the treatment of her chronic pain.  Discussed the use of AI scribe software for clinical note transcription with the patient, who gave verbal consent to proceed.  Discussed the use of AI scribe software for clinical note transcription with the patient, who gave verbal consent to proceed.  History of Present Illness   Karen Hill is a 48 year old female who presents with low back pain radiating to the right leg. She was referred by neurosurgery to avoid surgery.  She experiences low back pain that radiates into her right leg, extending to her toes. The pain is described as burning, tingling, and numbness. This has been ongoing for at least three years but has worsened in the past several months following a fall.  The initial cause of her symptoms  was a bulging disc, which has progressed to  affect the S1 nerve. MRI images show a more pronounced disc bulge on the right side.  She is currently taking gabapentin for her symptoms. No significant symptoms in her left leg, although she has started to feel some numbness in her toes.  She has not engaged in any specific exercises for her condition but has attempted home stretches.      Meds   Current Outpatient Medications:    albuterol (PROAIR HFA) 108 (90 Base) MCG/ACT inhaler, Inhale 2 puffs into the lungs every 6 (six) hours as needed for wheezing or shortness of breath., Disp: 108 each, Rfl: 3   baclofen (LIORESAL) 10 MG tablet, TAKE 1 TABLET BY MOUTH TWICE A DAY AS NEEDED FOR MUSCLE SPASMS, Disp: 180 tablet, Rfl: 1   cyanocobalamin (VITAMIN B12) 1000 MCG/ML injection, Inject 1 mL (1,000 mcg total) into the muscle every 14 (fourteen) days., Disp: 6 mL, Rfl: 0   gabapentin (NEURONTIN) 300 MG capsule, TAKE 1 CAPSULE BY MOUTH NIGHTLY AT BEDTIME FOR 7 DAYS, INCREASE TO 2 CAPS AT BEDTIME FOR NEUROPATHY, APPT TIME AND LABS PLEASE (Patient taking differently: 600 mg at bedtime.), Disp: 60 capsule, Rfl: 6   levothyroxine (SYNTHROID) 125 MCG tablet, TAKE 1 TABLET BY MOUTH DAILY BEFORE BREAKFAST., Disp: 90 tablet, Rfl: 1   Semaglutide-Weight Management (WEGOVY) 1 MG/0.5ML SOAJ, Inject 1 mg into the skin once a week., Disp: 2 mL, Rfl: 3   nicotine (NICODERM CQ - DOSED IN MG/24 HOURS) 14 mg/24hr patch, Place 1 patch (14 mg total) onto the skin daily., Disp: 28 patch, Rfl: 1  Imaging Review   MR LUMBAR SPINE WO CONTRAST  Narrative CLINICAL DATA:  Lumbar radiculopathy, symptoms persist with > 6 wks treatment  EXAM: MRI LUMBAR SPINE WITHOUT CONTRAST  TECHNIQUE: Multiplanar, multisequence MR imaging of the lumbar spine was performed. No intravenous contrast was administered.  COMPARISON:  05/25/2013  FINDINGS: Segmentation:  Normal.  Alignment:  Stable without significant  listhesis.  Vertebrae: Increased degenerative endplate marrow changes at L5-S1 eccentric to the left. No marrow edema. No suspicious lesion. There is a vertebral body hemangioma at L3.  Conus medullaris and cauda equina: Conus extends to the L1 level. Conus and cauda equina appear normal.  Paraspinal and other soft tissues: Unremarkable.  Disc levels:  L1-L2:  No stenosis.  L2-L3:  No stenosis.  L3-L4:  No stenosis.  L4-L5: Disc desiccation without disc height loss. Disc bulge with right foraminal annular fissure. No canal stenosis. Mild foraminal stenosis.  L5-S1: Disc desiccation with increased disc height loss. Disc bulge with endplate osteophytic ridging and superimposed right subarticular disc protrusion. No canal stenosis. Effacement of the right subarticular recess with compression of traversing S1 nerve root. Mild right and mild to moderate left foraminal stenosis.  IMPRESSION: Lower lumbar degenerative changes with progression since remote 2015 study. Most notably, there is a disc herniation at L5-S1 compressing the traversing right S1 nerve root.   Electronically Signed By: Guadlupe Spanish M.D. On: 03/10/2023 16:07    DG Lumbar Spine Complete  Narrative CLINICAL DATA:  Low back pain  EXAM: LUMBAR SPINE - COMPLETE 4+ VIEW  COMPARISON:  MR lumbar spine 02/28/2023  FINDINGS: Normal anatomic alignment. No evidence for active or static listhesis. Preservation of the vertebral body heights. L4-5 and L5-S1 degenerative disc disease. Lower lumbar spine facet degenerative changes.  IMPRESSION: Lower lumbar spine degenerative disc and facet disease.   Electronically Signed By: Annia Belt M.D. On: 03/15/2023 13:19   Complexity Note: Imaging results reviewed.  ROS  Cardiovascular: No reported cardiovascular signs or symptoms such as High blood pressure, coronary artery disease, abnormal heart rate or rhythm, heart attack,  blood thinner therapy or heart weakness and/or failure Pulmonary or Respiratory: Shortness of breath, Smoking, Snoring , and Temporary stoppage of breathing during sleep Neurological: No reported neurological signs or symptoms such as seizures, abnormal skin sensations, urinary and/or fecal incontinence, being born with an abnormal open spine and/or a tethered spinal cord Psychological-Psychiatric: Anxiousness, Depressed, Prone to panicking, and History of abuse Gastrointestinal: Reflux or heatburn and Irregular, infrequent bowel movements (Constipation) Genitourinary: No reported renal or genitourinary signs or symptoms such as difficulty voiding or producing urine, peeing blood, non-functioning kidney, kidney stones, difficulty emptying the bladder, difficulty controlling the flow of urine, or chronic kidney disease Hematological: Weakness due to low blood hemoglobin or red blood cell count (Anemia) and Brusing easily Endocrine: Slow thyroid Rheumatologic: No reported rheumatological signs and symptoms such as fatigue, joint pain, tenderness, swelling, redness, heat, stiffness, decreased range of motion, with or without associated rash Musculoskeletal: Negative for myasthenia gravis, muscular dystrophy, multiple sclerosis or malignant hyperthermia Work History: Quit going to work on his/her own  Allergies  Ms. Timberlake is allergic to penicillin g, amoxicillin, and shellfish allergy.  Laboratory Chemistry Profile   Renal Lab Results  Component Value Date   BUN 6 02/28/2023   CREATININE 0.67 02/28/2023   BCR 9 02/28/2023   PROTEINUR Negative 01/16/2012     Electrolytes Lab Results  Component Value Date   NA 144 02/28/2023   K 3.7 02/28/2023   CL 106 02/28/2023   CALCIUM 8.7 02/28/2023     Hepatic Lab Results  Component Value Date   AST 11 02/28/2023   ALT 6 02/28/2023   ALBUMIN 3.6 (L) 02/28/2023   ALKPHOS 101 02/28/2023     ID No results found for: "LYMEIGGIGMAB", "HIV",  "SARSCOV2NAA", "STAPHAUREUS", "MRSAPCR", "HCVAB", "PREGTESTUR", "RMSFIGG", "QFVRPH1IGG", "QFVRPH2IGG"   Bone Lab Results  Component Value Date   VD25OH 22.3 (L) 02/28/2023     Endocrine Lab Results  Component Value Date   GLUCOSE 83 02/28/2023   GLUCOSEU Negative 01/16/2012   HGBA1C 4.9 02/28/2023   TSH 0.034 (L) 02/28/2023   FREET4 1.71 02/28/2023     Neuropathy Lab Results  Component Value Date   VITAMINB12 154 (L) 02/28/2023   HGBA1C 4.9 02/28/2023     CNS No results found for: "COLORCSF", "APPEARCSF", "RBCCOUNTCSF", "WBCCSF", "POLYSCSF", "LYMPHSCSF", "EOSCSF", "PROTEINCSF", "GLUCCSF", "JCVIRUS", "CSFOLI", "IGGCSF", "LABACHR", "ACETBL"   Inflammation (CRP: Acute  ESR: Chronic) No results found for: "CRP", "ESRSEDRATE", "LATICACIDVEN"   Rheumatology No results found for: "RF", "ANA", "LABURIC", "URICUR", "LYMEIGGIGMAB", "LYMEABIGMQN", "HLAB27"   Coagulation Lab Results  Component Value Date   PLT 232 02/28/2023     Cardiovascular Lab Results  Component Value Date   HGB 12.7 02/28/2023   HCT 39.3 02/28/2023     Screening No results found for: "SARSCOV2NAA", "COVIDSOURCE", "STAPHAUREUS", "MRSAPCR", "HCVAB", "HIV", "PREGTESTUR"   Cancer No results found for: "CEA", "CA125", "LABCA2"   Allergens No results found for: "ALMOND", "APPLE", "ASPARAGUS", "AVOCADO", "BANANA", "BARLEY", "BASIL", "BAYLEAF", "GREENBEAN", "LIMABEAN", "WHITEBEAN", "BEEFIGE", "REDBEET", "BLUEBERRY", "BROCCOLI", "CABBAGE", "MELON", "CARROT", "CASEIN", "CASHEWNUT", "CAULIFLOWER", "CELERY"     Note: Lab results reviewed.  PFSH  Drug: Ms. Atkin  reports no history of drug use. Alcohol:  reports current alcohol use. Tobacco:  reports that she has been smoking cigarettes. She has a 10 pack-year smoking history. She has been exposed to tobacco smoke. She has never used  smokeless tobacco. Medical:  has a past medical history of Liver hemangioma, Lymphedema, and Thyroid disease. Family: family  history includes Diabetes in her mother; Hypertension in her father.  Past Surgical History:  Procedure Laterality Date   CESAREAN SECTION  2014   CHOLECYSTECTOMY  2005   COLONOSCOPY WITH PROPOFOL N/A 03/09/2023   Procedure: COLONOSCOPY WITH PROPOFOL;  Surgeon: Wyline Mood, MD;  Location: [facility] ENDOSCOPY;  Service: Gastroenterology;  Laterality: N/A;   MANDIBLE FRACTURE SURGERY     MULTIPLE TOOTH EXTRACTIONS     TUBAL LIGATION  2014   Active Ambulatory Problems    Diagnosis Date Noted   Hypothyroidism, adult 04/01/2022   Prediabetes 04/01/2022   Other fatigue 04/01/2022   Acute sinusitis 04/01/2022   Abdominal hernia without obstruction and without gangrene 11/14/2022   Lymphedema of both lower extremities 11/14/2022   Obesity, morbid (HCC) 02/12/2023   Sciatica of right side 02/12/2023   Colon cancer screening 03/09/2023   Chronic radicular lumbar pain 04/21/2023   Lumbar disc herniation with radiculopathy (RIGHT) 04/21/2023   Chronic pain syndrome 04/21/2023   Resolved Ambulatory Problems    Diagnosis Date Noted   No Resolved Ambulatory Problems   Past Medical History:  Diagnosis Date   Liver hemangioma    Lymphedema    Thyroid disease    Constitutional Exam  General appearance: Well nourished, well developed, and well hydrated. In no apparent acute distress Vitals:   04/21/23 0817  BP: 133/65  Pulse: (!) 103  Temp: (!) 97.3 F (36.3 C)  TempSrc: Temporal  SpO2: 100%  Weight: 248 lb (112.5 kg)  Height: 5\' 6"  (1.676 m)   BMI Assessment: Estimated body mass index is 40.03 kg/m as calculated from the following:   Height as of this encounter: 5\' 6"  (1.676 m).   Weight as of this encounter: 248 lb (112.5 kg).  BMI interpretation table: BMI level Category Range association with higher incidence of chronic pain  <18 kg/m2 Underweight   18.5-24.9 kg/m2 Ideal body weight   25-29.9 kg/m2 Overweight Increased incidence by 20%  30-34.9 kg/m2 Obese (Class I) Increased  incidence by 68%  35-39.9 kg/m2 Severe obesity (Class II) Increased incidence by 136%  >40 kg/m2 Extreme obesity (Class III) Increased incidence by 254%   Patient's current BMI Ideal Body weight  Body mass index is 40.03 kg/m. Ideal body weight: 59.3 kg (130 lb 11.7 oz) Adjusted ideal body weight: 80.6 kg (177 lb 10.2 oz)   BMI Readings from Last 4 Encounters:  04/21/23 40.03 kg/m  03/21/23 36.74 kg/m  03/16/23 37.36 kg/m  03/09/23 37.44 kg/m   Wt Readings from Last 4 Encounters:  04/21/23 248 lb (112.5 kg)  03/21/23 248 lb 12.8 oz (112.9 kg)  03/16/23 253 lb (114.8 kg)  03/09/23 253 lb 8.5 oz (115 kg)    Psych/Mental status: Alert, oriented x 3 (person, place, & time)       Eyes: PERLA Respiratory: No evidence of acute respiratory distress  Lumbar Spine Area Exam  Skin & Axial Inspection: No masses, redness, or swelling Alignment: Symmetrical Functional ROM: Pain restricted ROM affecting primarily the right Stability: No instability detected Muscle Tone/Strength: Functionally intact. No obvious neuro-muscular anomalies detected. Sensory (Neurological): Dermatomal pain pattern Palpation: No palpable anomalies       Provocative Tests: Hyperextension/rotation test: deferred today       Lumbar quadrant test (Kemp's test): (+) on the right for foraminal stenosis  Gait & Posture Assessment  Ambulation: Unassisted Gait: Relatively normal for age and  body habitus Posture: WNL  Lower Extremity Exam    Side: Right lower extremity  Side: Left lower extremity  Stability: No instability observed          Stability: No instability observed          Skin & Extremity Inspection: Skin color, temperature, and hair growth are WNL. No peripheral edema or cyanosis. No masses, redness, swelling, asymmetry, or associated skin lesions. No contractures.  Skin & Extremity Inspection: Skin color, temperature, and hair growth are WNL. No peripheral edema or cyanosis. No masses, redness,  swelling, asymmetry, or associated skin lesions. No contractures.  Functional ROM: Pain restricted ROM for all joints of the lower extremity          Functional ROM: Unrestricted ROM                  Muscle Tone/Strength: Functionally intact. No obvious neuro-muscular anomalies detected.  Muscle Tone/Strength: Functionally intact. No obvious neuro-muscular anomalies detected.  Sensory (Neurological): Dermatomal pain pattern        Sensory (Neurological): Unimpaired        DTR: Patellar: deferred today Achilles: deferred today Plantar: deferred today  DTR: Patellar: deferred today Achilles: deferred today Plantar: deferred today  Palpation: No palpable anomalies  Palpation: No palpable anomalies    Assessment  Primary Diagnosis & Pertinent Problem List: The primary encounter diagnosis was Lumbar radiculopathy. Diagnoses of Chronic radicular lumbar pain, Lumbar disc herniation with radiculopathy (RIGHT), and Chronic pain syndrome were also pertinent to this visit.  Visit Diagnosis (New problems to examiner): 1. Lumbar radiculopathy   2. Chronic radicular lumbar pain   3. Lumbar disc herniation with radiculopathy (RIGHT)   4. Chronic pain syndrome    Plan of Care (Initial workup plan)  Assessment and Plan    Lumbar radiculopathy   Chronic lumbar radiculopathy with disc herniation at L5-S1 primarily affects the right side, causing burning, tingling, and numbness extending to the toes. Symptoms worsened over several months following a fall. MRI reveals significant disc herniation impacting the S1 nerve, with potential L5 nerve involvement. The goal is to avoid surgery through conservative management.  Recommend right L5 and S1 transforaminal ESI.   Disc herniation at L5-S1   Disc herniation at L5-S1 significantly impacts the S1 nerve with possible L5 nerve involvement, confirmed on MRI. She is referred to avoid surgical intervention. Review MRI findings to ensure understanding of the  condition.  Right leg numbness   Numbness in the right leg extends to the toes, associated with lumbar radiculopathy. No significant symptoms in the left leg, though some numbness in the toes is noted.  Gabapentin use   She is currently on gabapentin for symptom management without adverse effects or need for adjustment.  Home exercise regimen   She has attempted home stretches to manage symptoms. Encourage continuation of home stretching exercises.        Procedure Orders         Lumbar Transforaminal Epidural         Provider-requested follow-up: Return for Right L5 and S1 TF ESI, in clinic NS.  Future Appointments  Date Time Provider Department Center  04/25/2023 11:00 AM AMA-NURSE AMA-AMA None  05/10/2023  9:00 AM GI-315 CT 2 GI-315CT [facility] [facility]  05/11/2023  9:30 AM Drake Leach, PA-C CNS-CNS None  05/23/2023  8:30 AM Leafy Ro, MD AS-AS None  05/30/2023  9:30 AM Miki Kins, FNP AMA-AMA None   I discussed the assessment and treatment plan  with the patient. The patient was provided an opportunity to ask questions and all were answered. The patient agreed with the plan and demonstrated an understanding of the instructions.  Patient advised to call back or seek an in-person evaluation if the symptoms or condition worsens.  Duration of encounter: .  Total time on encounter, as per AMA guidelines included both the face-to-face and non-face-to-face time personally spent by the physician and/or other qualified health care professional(s) on the day of the encounter (includes time in activities that require the physician or other qualified health care professional and does not include time in activities normally performed by clinical staff). Physician's time may include the following activities when performed: Preparing to see the patient (e.g., pre-charting review of records, searching for previously ordered imaging, lab work, and nerve conduction tests) Review of  prior analgesic pharmacotherapies. Reviewing PMP Interpreting ordered tests (e.g., lab work, imaging, nerve conduction tests) Performing post-procedure evaluations, including interpretation of diagnostic procedures Obtaining and/or reviewing separately obtained history Performing a medically appropriate examination and/or evaluation Counseling and educating the patient/family/caregiver Ordering medications, tests, or procedures Referring and communicating with other health care professionals (when not separately reported) Documenting clinical information in the electronic or other health record Independently interpreting results (not separately reported) and communicating results to the patient/ family/caregiver Care coordination (not separately reported)  Note by: Edward Jolly, MD (TTS and AI technology used. I apologize for any typographical errors that were not detected and corrected.) Date: 04/21/2023; Time: 12:21 PM

## 2023-04-25 ENCOUNTER — Ambulatory Visit (INDEPENDENT_AMBULATORY_CARE_PROVIDER_SITE_OTHER): Admitting: Family

## 2023-04-25 ENCOUNTER — Telehealth: Payer: Self-pay

## 2023-04-25 DIAGNOSIS — E538 Deficiency of other specified B group vitamins: Secondary | ICD-10-CM

## 2023-04-25 MED ORDER — CYANOCOBALAMIN 1000 MCG/ML IJ SOLN
1000.0000 ug | Freq: Once | INTRAMUSCULAR | Status: AC
  Administered 2023-04-25: 1000 ug via INTRAMUSCULAR

## 2023-04-25 NOTE — Telephone Encounter (Signed)
 This patient has the prepaid medicaid that requires PT. I have sent the request but they will probably deny it due to no physical therapy.

## 2023-04-29 NOTE — Progress Notes (Deleted)
 Referring Physician:  Trenda Frisk, FNP [address] [place],  [place] [address]  Primary Physician:  Trenda Frisk, FNP  History of Present Illness: 04/29/2023 Karen Hill has a history of hypothyroidism, prediabetes, lymphedema of lower extremities, and obesity.   Last seen by me on 03/16/23 for for right sided back and leg pain. She has known DDD L4-S1, worse at L5-S1 with slight slip. Alos with mild bilateral foraminal stenosis L4-L5 and right disc L5-S1 that compresses S1 nerve with mild right and mild/moderate left foraminal stenosis.   PT recommended and she declined. She was to continue to work on smoking cessation and weight loss.   She saw Dr. Rhesa Celeste on 04/21/23 and he recommended lumbar ESI. This has not been scheduled. ***  She is here for follow up.         LBP likely due to DDD. Right leg pain likely from disc at L5-S1.  She is about the same. She continues with constant right sided LBP with right lateral/posterior leg pain to her foot. No left leg pain. She has numbness and tingling only in her right foot. Pain is worse with bending, standing still, and sitting. Some relief with laying flat. She has weakness in right leg.   She is taking baclofen  and neurontin. These help very little.   She smokes 1 PPD x 15 years. She is working on quitting.   Bowel/Bladder Dysfunction: she notes some urinary leaking for over 1 month that has resolved. No bowel issues. No perineal numbness.   Conservative measures:  Physical therapy: has not participated in PT Multimodal medical therapy including regular antiinflammatories:  Gabapentin, Prednisone  Injections: no epidural steroid injections  Past Surgery: no spinal surgeries  Karen Hill has no symptoms of cervical myelopathy.  The symptoms are causing a significant impact on the patient's life.   Review of Systems:  A 10 point review of systems is negative, except for the pertinent positives and  negatives detailed in the HPI.  Past Medical History: Past Medical History:  Diagnosis Date   Liver hemangioma    Lymphedema    Thyroid disease     Past Surgical History: Past Surgical History:  Procedure Laterality Date   CESAREAN SECTION  2014   CHOLECYSTECTOMY  2005   COLONOSCOPY WITH PROPOFOL  N/A 03/09/2023   Procedure: COLONOSCOPY WITH PROPOFOL ;  Surgeon: Luke Salaam, MD;  Location: [facility] ENDOSCOPY;  Service: Gastroenterology;  Laterality: N/A;   MANDIBLE FRACTURE SURGERY     MULTIPLE TOOTH EXTRACTIONS     TUBAL LIGATION  2014    Allergies: Allergies as of 05/11/2023 - Review Complete 04/21/2023  Allergen Reaction Noted   Penicillin g Hives and Itching 11/08/2013   Amoxicillin  04/01/2022   Shellfish allergy Nausea And Vomiting 03/12/2016    Medications: Outpatient Encounter Medications as of 05/11/2023  Medication Sig   albuterol  (PROAIR  HFA) 108 (90 Base) MCG/ACT inhaler Inhale 2 puffs into the lungs every 6 (six) hours as needed for wheezing or shortness of breath.   baclofen  (LIORESAL ) 10 MG tablet TAKE 1 TABLET BY MOUTH TWICE A DAY AS NEEDED FOR MUSCLE SPASMS   cyanocobalamin  (VITAMIN B12) 1000 MCG/ML injection Inject 1 mL (1,000 mcg total) into the muscle every 14 (fourteen) days.   gabapentin (NEURONTIN) 300 MG capsule TAKE 1 CAPSULE BY MOUTH NIGHTLY AT BEDTIME FOR 7 DAYS, INCREASE TO 2 CAPS AT BEDTIME FOR NEUROPATHY, APPT TIME AND LABS PLEASE (Patient taking differently: 600 mg at bedtime.)   levothyroxine  (SYNTHROID )  125 MCG tablet TAKE 1 TABLET BY MOUTH DAILY BEFORE BREAKFAST.   nicotine  (NICODERM CQ  - DOSED IN MG/24 HOURS) 14 mg/24hr patch Place 1 patch (14 mg total) onto the skin daily.   Semaglutide -Weight Management (WEGOVY ) 1 MG/0.5ML SOAJ Inject 1 mg into the skin once a week.   No facility-administered encounter medications on file as of 05/11/2023.    Social History: Social History   Tobacco Use   Smoking status: Every Day    Current packs/day: 1.00     Average packs/day: 1 pack/day for 10.0 years (10.0 ttl pk-yrs)    Types: Cigarettes    Passive exposure: Past   Smokeless tobacco: Never  Vaping Use   Vaping status: Former  Substance Use Topics   Alcohol use: Yes    Comment: rare   Drug use: No    Family Medical History: Family History  Problem Relation Age of Onset   Diabetes Mother    Hypertension Father    Breast cancer Neg Hx     Physical Examination: There were no vitals filed for this visit.    Awake, alert, oriented to person, place, and time.  Speech is clear and fluent. Fund of knowledge is appropriate.   Cranial Nerves: Pupils equal round and reactive to light.  Facial tone is symmetric.    No posterior lumbar tenderness.   No abnormal lesions on exposed skin.   Strength: Side Iliopsoas Quads Hamstring PF DF EHL  R 5 5 5 5 5 5   L 5 5 5 5 5 5    Reflexes are 2+ and symmetric at the patella and achilles.    Clonus is not present.   Bilateral lower extremity sensation is intact to light touch.     Has known lymphedema in both legs.   Gait is slow with slight limp favoring right leg.    Medical Decision Making  Imaging: None   Assessment and Plan: Ms. Musquiz continues with constant right sided LBP with right lateral/posterior leg pain to her foot. No left leg pain. She has numbness and tingling only in her right foot. She has weakness in right leg.   She has known DDD L4-S1, worse at L5-S1 with slight slip. Alos with mild bilateral foraminal stenosis L4-L5 and right disc L5-S1 that compresses S1 nerve with mild right and mild/moderate left foraminal stenosis.   LBP likely due to DDD. Right leg pain likely from disc at L5-S1.   Treatment options discussed with patient and following plan made:   - Referral to pain management (Late[facility]) to discuss possible lumbar injections.  - Discussed lumbar PT. She declines for now.  - She is on waiting list for PT at [facility] for her lymphedema. Message to her PCP  letting her know that [facility] in [place] takes medicaid.  - She will continue to work on smoking cessation and weight loss. She has been doing this to have hernia repair.  - She understands that she may need to revisit PT if no improvement with injections.  - Follow up with me in 6-8 weeks and prn.   I spent a total of 25 minutes in face-to-face and non-face-to-face activities related to this patient's care today including review of outside records, review of imaging, review of symptoms, physical exam, discussion of differential diagnosis, discussion of treatment options, and documentation.   Lucetta Russel PA-C Dept. of Neurosurgery

## 2023-05-03 ENCOUNTER — Other Ambulatory Visit: Payer: Self-pay

## 2023-05-03 ENCOUNTER — Encounter: Payer: Self-pay | Admitting: Family

## 2023-05-03 MED ORDER — ALBUTEROL SULFATE HFA 108 (90 BASE) MCG/ACT IN AERS: 2.0000 | INHALATION_SPRAY | Freq: Four times a day (QID) | RESPIRATORY_TRACT | 3 refills | Status: AC | PRN

## 2023-05-07 ENCOUNTER — Encounter: Payer: Self-pay | Admitting: Family

## 2023-05-07 NOTE — Assessment & Plan Note (Signed)
 Checking labs today.  Will continue supplements as needed.

## 2023-05-07 NOTE — Assessment & Plan Note (Signed)

## 2023-05-07 NOTE — Progress Notes (Signed)
 Established Patient Office Visit  Subjective:  Patient ID: Karen Hill, female    DOB: 05-Dec-1975  Age: 48 y.o. MRN: [id number]  Chief Complaint  Patient presents with   Follow-up    3 month follow up    Patient is here today for her 3 months follow up.  She has been feeling well since last appointment.   She does have additional concerns to discuss today.  She asks if we can get her Nicotine  patches to help her stop smoking.   Labs are due today. She needs refills.   I have reviewed her active problem list, medication list, allergies, health maintenance, notes from last encounter, lab results for her appointment today.      No other concerns at this time.   Past Medical History:  Diagnosis Date   Liver hemangioma    Lymphedema    Thyroid disease     Past Surgical History:  Procedure Laterality Date   CESAREAN SECTION  2014   CHOLECYSTECTOMY  2005   COLONOSCOPY WITH PROPOFOL  N/A 03/09/2023   Procedure: COLONOSCOPY WITH PROPOFOL ;  Surgeon: Luke Salaam, MD;  Location: [facility] ENDOSCOPY;  Service: Gastroenterology;  Laterality: N/A;   MANDIBLE FRACTURE SURGERY     MULTIPLE TOOTH EXTRACTIONS     TUBAL LIGATION  2014    Social History   Socioeconomic History   Marital status: Married    Spouse name: Not on file   Number of children: Not on file   Years of education: Not on file   Highest education level: Not on file  Occupational History   Not on file  Tobacco Use   Smoking status: Every Day    Current packs/day: 1.00    Average packs/day: 1 pack/day for 10.0 years (10.0 ttl pk-yrs)    Types: Cigarettes    Passive exposure: Past   Smokeless tobacco: Never  Vaping Use   Vaping status: Former  Substance and Sexual Activity   Alcohol use: Yes    Comment: rare   Drug use: No   Sexual activity: Not on file  Other Topics Concern   Not on file  Social History Narrative   Not on file   Social Drivers of Health   Financial Resource Strain: Not on file   Food Insecurity: Not on file  Transportation Needs: Not on file  Physical Activity: Not on file  Stress: Not on file  Social Connections: Not on file  Intimate Partner Violence: Not on file    Family History  Problem Relation Age of Onset   Diabetes Mother    Hypertension Father    Breast cancer Neg Hx     Allergies  Allergen Reactions   Penicillin G Hives and Itching   Amoxicillin    Shellfish Allergy Nausea And Vomiting    Review of Systems  Genitourinary:        Abdominal hernia -   All other systems reviewed and are negative.      Objective:   BP 100/68   Pulse 96   Ht 5\' 6"  (1.676 m)   Wt 257 lb 9.6 oz (116.8 kg)   LMP 03/08/2023 (Exact Date)   SpO2 98%   BMI 41.58 kg/m   Vitals:   02/28/23 1259  BP: 100/68  Pulse: 96  Height: 5\' 6"  (1.676 m)  Weight: 257 lb 9.6 oz (116.8 kg)  SpO2: 98%  BMI (Calculated): 41.6    Physical Exam Vitals and nursing note reviewed.  Constitutional:  Appearance: Normal appearance. She is obese.  HENT:     Head: Normocephalic.  Eyes:     Extraocular Movements: Extraocular movements intact.     Conjunctiva/sclera: Conjunctivae normal.     Pupils: Pupils are equal, round, and reactive to light.  Cardiovascular:     Rate and Rhythm: Normal rate.  Pulmonary:     Effort: Pulmonary effort is normal.  Abdominal:     Hernia: A hernia is present.  Musculoskeletal:     Cervical back: Normal range of motion.  Neurological:     General: No focal deficit present.     Mental Status: She is alert and oriented to person, place, and time. Mental status is at baseline.  Psychiatric:        Mood and Affect: Mood normal.        Behavior: Behavior normal.        Thought Content: Thought content normal.        Judgment: Judgment normal.      Results for orders placed or performed in visit on 02/28/23  Lipid panel  Result Value Ref Range   Cholesterol, Total 153 100 - 199 mg/dL   Triglycerides 829 0 - 149 mg/dL   HDL  31 (L) >56 mg/dL   VLDL Cholesterol Cal 21 5 - 40 mg/dL   LDL Chol Calc (NIH) 213 (H) 0 - 99 mg/dL   Chol/HDL Ratio 4.9 (H) 0.0 - 4.4 ratio  VITAMIN D  25 Hydroxy (Vit-D Deficiency, Fractures)  Result Value Ref Range   Vit D, 25-Hydroxy 22.3 (L) 30.0 - 100.0 ng/mL  CMP14+EGFR  Result Value Ref Range   Glucose 83 70 - 99 mg/dL   BUN 6 6 - 24 mg/dL   Creatinine, Ser 0.86 0.57 - 1.00 mg/dL   eGFR 578 >46 NG/EXB/2.84   BUN/Creatinine Ratio 9 9 - 23   Sodium 144 134 - 144 mmol/L   Potassium 3.7 3.5 - 5.2 mmol/L   Chloride 106 96 - 106 mmol/L   CO2 24 20 - 29 mmol/L   Calcium 8.7 8.7 - 10.2 mg/dL   Total Protein 6.2 6.0 - 8.5 g/dL   Albumin 3.6 (L) 3.9 - 4.9 g/dL   Globulin, Total 2.6 1.5 - 4.5 g/dL   Bilirubin Total 0.7 0.0 - 1.2 mg/dL   Alkaline Phosphatase 101 44 - 121 IU/L   AST 11 0 - 40 IU/L   ALT 6 0 - 32 IU/L  Hemoglobin A1c  Result Value Ref Range   Hgb A1c MFr Bld 4.9 4.8 - 5.6 %   Est. average glucose Bld gHb Est-mCnc 94 mg/dL  Vitamin B12  Result Value Ref Range   Vitamin B-12 154 (L) 232 - 1,245 pg/mL  TSH+T4F+T3Free  Result Value Ref Range   TSH 0.034 (L) 0.450 - 4.500 uIU/mL   T3, Free 3.9 2.0 - 4.4 pg/mL   Free T4 1.71 0.82 - 1.77 ng/dL  Iron, TIBC and Ferritin Panel  Result Value Ref Range   Total Iron Binding Capacity 327 250 - 450 ug/dL   UIBC 132 440 - 102 ug/dL   Iron 38 27 - 725 ug/dL   Iron Saturation 12 (L) 15 - 55 %   Ferritin 20 15 - 150 ng/mL  CBC with Diff  Result Value Ref Range   WBC 9.2 3.4 - 10.8 x10E3/uL   RBC 4.15 3.77 - 5.28 x10E6/uL   Hemoglobin 12.7 11.1 - 15.9 g/dL   Hematocrit 36.6 44.0 - 46.6 %   MCV  95 79 - 97 fL   MCH 30.6 26.6 - 33.0 pg   MCHC 32.3 31.5 - 35.7 g/dL   RDW 09.8 11.9 - 14.7 %   Platelets 232 150 - 450 x10E3/uL   Neutrophils 69 Not Estab. %   Lymphs 25 Not Estab. %   Monocytes 4 Not Estab. %   Eos 2 Not Estab. %   Basos 0 Not Estab. %   Neutrophils Absolute 6.3 1.4 - 7.0 x10E3/uL   Lymphocytes Absolute 2.3  0.7 - 3.1 x10E3/uL   Monocytes Absolute 0.3 0.1 - 0.9 x10E3/uL   EOS (ABSOLUTE) 0.2 0.0 - 0.4 x10E3/uL   Basophils Absolute 0.0 0.0 - 0.2 x10E3/uL   Immature Granulocytes 0 Not Estab. %   Immature Grans (Abs) 0.0 0.0 - 0.1 x10E3/uL  Hepatitis C Ab reflex to Quant PCR  Result Value Ref Range   HCV Ab Non Reactive Non Reactive  Interpretation:  Result Value Ref Range   HCV Interp 1: Comment     Recent Results (from the past 2160 hours)  Lipid panel     Status: Abnormal   Collection Time: 02/28/23  2:00 PM  Result Value Ref Range   Cholesterol, Total 153 100 - 199 mg/dL   Triglycerides 829 0 - 149 mg/dL   HDL 31 (L) >56 mg/dL   VLDL Cholesterol Cal 21 5 - 40 mg/dL   LDL Chol Calc (NIH) 213 (H) 0 - 99 mg/dL   Chol/HDL Ratio 4.9 (H) 0.0 - 4.4 ratio    Comment:                                   T. Chol/HDL Ratio                                             Men  Women                               1/2 Avg.Risk  3.4    3.3                                   Avg.Risk  5.0    4.4                                2X Avg.Risk  9.6    7.1                                3X Avg.Risk 23.4   11.0   VITAMIN D  25 Hydroxy (Vit-D Deficiency, Fractures)     Status: Abnormal   Collection Time: 02/28/23  2:00 PM  Result Value Ref Range   Vit D, 25-Hydroxy 22.3 (L) 30.0 - 100.0 ng/mL    Comment: Vitamin D  deficiency has been defined by the Institute of Medicine and an Endocrine Society practice guideline as a level of serum 25-OH vitamin D  less than 20 ng/mL (1,2). The Endocrine Society went on to further define vitamin D  insufficiency as a level between 21 and 29 ng/mL (2). 1. IOM (Institute of Medicine).  2010. Dietary reference    intakes for calcium and D. [place]: The    [employer]. 2. Holick MF, Binkley Clive, Bischoff-Ferrari HA, et al.    Evaluation, treatment, and prevention of vitamin D     deficiency: an Endocrine Society clinical practice    guideline. JCEM. 2011 Jul;  96(7):1911-30.   CMP14+EGFR     Status: Abnormal   Collection Time: 02/28/23  2:00 PM  Result Value Ref Range   Glucose 83 70 - 99 mg/dL   BUN 6 6 - 24 mg/dL   Creatinine, Ser 9.62 0.57 - 1.00 mg/dL   eGFR 952 >84 XL/KGM/0.10   BUN/Creatinine Ratio 9 9 - 23   Sodium 144 134 - 144 mmol/L   Potassium 3.7 3.5 - 5.2 mmol/L   Chloride 106 96 - 106 mmol/L   CO2 24 20 - 29 mmol/L   Calcium 8.7 8.7 - 10.2 mg/dL   Total Protein 6.2 6.0 - 8.5 g/dL   Albumin 3.6 (L) 3.9 - 4.9 g/dL   Globulin, Total 2.6 1.5 - 4.5 g/dL   Bilirubin Total 0.7 0.0 - 1.2 mg/dL   Alkaline Phosphatase 101 44 - 121 IU/L   AST 11 0 - 40 IU/L   ALT 6 0 - 32 IU/L  Hemoglobin A1c     Status: None   Collection Time: 02/28/23  2:00 PM  Result Value Ref Range   Hgb A1c MFr Bld 4.9 4.8 - 5.6 %    Comment:          Prediabetes: 5.7 - 6.4          Diabetes: >6.4          Glycemic control for adults with diabetes: <7.0    Est. average glucose Bld gHb Est-mCnc 94 mg/dL  Vitamin B12     Status: Abnormal   Collection Time: 02/28/23  2:00 PM  Result Value Ref Range   Vitamin B-12 154 (L) 232 - 1,245 pg/mL  TSH+T4F+T3Free     Status: Abnormal   Collection Time: 02/28/23  2:00 PM  Result Value Ref Range   TSH 0.034 (L) 0.450 - 4.500 uIU/mL   T3, Free 3.9 2.0 - 4.4 pg/mL   Free T4 1.71 0.82 - 1.77 ng/dL  Iron, TIBC and Ferritin Panel     Status: Abnormal   Collection Time: 02/28/23  2:00 PM  Result Value Ref Range   Total Iron Binding Capacity 327 250 - 450 ug/dL   UIBC 272 536 - 644 ug/dL   Iron 38 27 - 034 ug/dL   Iron Saturation 12 (L) 15 - 55 %   Ferritin 20 15 - 150 ng/mL  CBC with Diff     Status: None   Collection Time: 02/28/23  2:00 PM  Result Value Ref Range   WBC 9.2 3.4 - 10.8 x10E3/uL   RBC 4.15 3.77 - 5.28 x10E6/uL   Hemoglobin 12.7 11.1 - 15.9 g/dL   Hematocrit 74.2 59.5 - 46.6 %   MCV 95 79 - 97 fL   MCH 30.6 26.6 - 33.0 pg   MCHC 32.3 31.5 - 35.7 g/dL   RDW 63.8 75.6 - 43.3 %   Platelets 232  150 - 450 x10E3/uL   Neutrophils 69 Not Estab. %   Lymphs 25 Not Estab. %   Monocytes 4 Not Estab. %   Eos 2 Not Estab. %   Basos 0 Not Estab. %   Neutrophils Absolute 6.3 1.4 - 7.0 x10E3/uL   Lymphocytes  Absolute 2.3 0.7 - 3.1 x10E3/uL   Monocytes Absolute 0.3 0.1 - 0.9 x10E3/uL   EOS (ABSOLUTE) 0.2 0.0 - 0.4 x10E3/uL   Basophils Absolute 0.0 0.0 - 0.2 x10E3/uL   Immature Granulocytes 0 Not Estab. %   Immature Grans (Abs) 0.0 0.0 - 0.1 x10E3/uL  Hepatitis C Ab reflex to Quant PCR     Status: None   Collection Time: 02/28/23  2:00 PM  Result Value Ref Range   HCV Ab Non Reactive Non Reactive  Interpretation:     Status: None   Collection Time: 02/28/23  2:00 PM  Result Value Ref Range   HCV Interp 1: Comment     Comment: Not infected with HCV unless early or acute infection is suspected (which may be delayed in an immunocompromised individual), or other evidence exists to indicate HCV infection.        Assessment & Plan:   Problem List Items Addressed This Visit       Endocrine   Hypothyroidism, adult   Checking labs today.  Will continue supplements as needed.        Relevant Orders   CMP14+EGFR (Completed)   TSH+T4F+T3Free (Completed)   CBC with Diff (Completed)     Other   Prediabetes   Patient educated on foods that contain carbohydrates and the need to decrease intake.  We discussed prediabetes, and what it means and the need for strict dietary control to prevent progression to type 2 diabetes.  Advised to decrease intake of sugary drinks, including sodas, sweet tea, and some juices, and of starch and sugar heavy foods (ie., potatoes, rice, bread, pasta, desserts). She verbalizes understanding and agreement with the changes discussed today.  A1C Continues to be in prediabetic ranges.  Will reassess at follow up after next lab check.  Patient counseled on dietary choices and verbalized understanding.        Relevant Orders   CMP14+EGFR (Completed)    Hemoglobin A1c (Completed)   CBC with Diff (Completed)   Other fatigue   Checking labs today.  Will continue supplements as needed.        Relevant Orders   CMP14+EGFR (Completed)   Iron, TIBC and Ferritin Panel (Completed)   CBC with Diff (Completed)   Other Visit Diagnoses       Encounter for preoperative examination for general surgical procedure    -  Primary   Patient is cleared for surgery. No additional testing needed at this time   Relevant Orders   EKG 12-Lead   CMP14+EGFR (Completed)   CBC with Diff (Completed)     B12 deficiency due to diet       Checking labs today.  Will continue supplements as needed.   Relevant Orders   CMP14+EGFR (Completed)   Vitamin B12 (Completed)   CBC with Diff (Completed)     Vitamin D  deficiency, unspecified       Checking labs today.  Will continue supplements as needed.   Relevant Orders   VITAMIN D  25 Hydroxy (Vit-D Deficiency, Fractures) (Completed)   CMP14+EGFR (Completed)   CBC with Diff (Completed)     Need for hepatitis C screening test       Test ordered in office today. Will call with results.   Relevant Orders   CMP14+EGFR (Completed)   CBC with Diff (Completed)   Hepatitis C Ab reflex to Quant PCR (Completed)   Interpretation: (Completed)     Mixed hyperlipidemia       Checking labs today.  Continue current therapy for lipid control. Will modify as needed based on labwork results.   Relevant Orders   Lipid panel (Completed)   CMP14+EGFR (Completed)   CBC with Diff (Completed)       Return in about 3 months (around 05/28/2023).   Total time spent: 20 minutes  Trenda Frisk, FNP  02/28/2023   This document may have been prepared by [facility] Voice Recognition software and as such may include unintentional dictation errors.

## 2023-05-08 ENCOUNTER — Encounter: Payer: Self-pay | Admitting: Family

## 2023-05-08 NOTE — Progress Notes (Signed)
   CHIEF COMPLAINT  B12 Shot     REASON FOR VISIT  B12 Injection     ASSESSMENT  B12 Deficiency, Unspecified     PLAN  Diagnoses and all orders for this visit:  B12 deficiency due to diet -     cyanocobalamin  (VITAMIN B12) injection 1,000 mcg     Pt. given B12 injection in clinic.  Return for next injection per provider instructions.   Total time spent: 5 minutes  Trenda Frisk, FNP  04/25/2023

## 2023-05-09 ENCOUNTER — Ambulatory Visit (INDEPENDENT_AMBULATORY_CARE_PROVIDER_SITE_OTHER): Admitting: Family

## 2023-05-09 DIAGNOSIS — E538 Deficiency of other specified B group vitamins: Secondary | ICD-10-CM | POA: Diagnosis not present

## 2023-05-09 MED ORDER — SYRINGE 20G X 1" 3 ML MISC: 1.0000 | 3 refills | Status: AC

## 2023-05-09 MED ORDER — CYANOCOBALAMIN 1000 MCG/ML IJ SOLN
1000.0000 ug | Freq: Once | INTRAMUSCULAR | Status: AC
  Administered 2023-05-09: 1000 ug via INTRAMUSCULAR

## 2023-05-10 ENCOUNTER — Other Ambulatory Visit

## 2023-05-11 ENCOUNTER — Ambulatory Visit: Admitting: Orthopedic Surgery

## 2023-05-12 ENCOUNTER — Encounter: Payer: Self-pay | Admitting: Student in an Organized Health Care Education/Training Program

## 2023-05-13 ENCOUNTER — Encounter: Payer: Self-pay | Admitting: *Deleted

## 2023-05-13 NOTE — Progress Notes (Unsigned)
 Referring Physician:  Trenda Frisk, FNP [address] [place],  [place] [address]  Primary Physician:  Karen Frisk, FNP  History of Present Illness: [name] has a history of hypothyroidism, prediabetes, lymphedema of lower extremities, and obesity.   Last seen by me on 03/16/23 for for right sided back and leg pain. She has known DDD L4-S1, worse at L5-S1 with slight slip. Also with mild bilateral foraminal stenosis L4-L5 and right disc L5-S1 that compresses S1 nerve with mild right and mild/moderate left foraminal stenosis.   PT recommended and she declined. She was to continue to work on smoking cessation and weight loss.   She saw Dr. Rhesa Hill on 04/21/23 and he recommended lumbar ESI. This has not been scheduled- looks like they were working on Therapist, occupational. It was denied as she has not done PT. PT referral denied by insurance per patient.   She is here for follow up.   She is about the same. She continues with constant right sided LBP with right lateral/posterior leg pain to her foot. No left leg pain. She has numbness and tingling in her right foot, starting to have some numbness/tingling in left toes as well. Pain is worse with bending, standing still, and sitting.   She was 89 at last visit, she has lost 20 pounds!  Had fall out of bed last week- had some giving way of left knee initially but this resolved.   She is taking baclofen  and neurontin. These help very little.   She smokes 1 PPD x 15 years. She is working on quitting.   Bowel/Bladder Dysfunction: none  Conservative measures:  Physical therapy: has not participated in PT Multimodal medical therapy including regular antiinflammatories:  Gabapentin, Prednisone  Injections: no epidural steroid injections  Past Surgery: no spinal surgeries  Karen Hill has no symptoms of cervical myelopathy.  The symptoms are causing a significant impact on the patient's life.   Review of Systems:  A  10 point review of systems is negative, except for the pertinent positives and negatives detailed in the HPI.  Past Medical History: Past Medical History:  Diagnosis Date   Liver hemangioma    Lymphedema    Thyroid disease     Past Surgical History: Past Surgical History:  Procedure Laterality Date   CESAREAN SECTION  2014   CHOLECYSTECTOMY  2005   COLONOSCOPY WITH PROPOFOL  N/A 03/09/2023   Procedure: COLONOSCOPY WITH PROPOFOL ;  Surgeon: Karen Salaam, MD;  Location: [facility] ENDOSCOPY;  Service: Gastroenterology;  Laterality: N/A;   MANDIBLE FRACTURE SURGERY     MULTIPLE TOOTH EXTRACTIONS     TUBAL LIGATION  2014    Allergies: Allergies as of 05/17/2023 - Review Complete 05/08/2023  Allergen Reaction Noted   Penicillin g Hives and Itching 11/08/2013   Amoxicillin  04/01/2022   Shellfish allergy Nausea And Vomiting 03/12/2016    Medications: Outpatient Encounter Medications as of 05/17/2023  Medication Sig   albuterol  (PROAIR  HFA) 108 (90 Base) MCG/ACT inhaler Inhale 2 puffs into the lungs every 6 (six) hours as needed for wheezing or shortness of breath.   baclofen  (LIORESAL ) 10 MG tablet TAKE 1 TABLET BY MOUTH TWICE A DAY AS NEEDED FOR MUSCLE SPASMS   cyanocobalamin  (VITAMIN B12) 1000 MCG/ML injection Inject 1 mL (1,000 mcg total) into the muscle every 14 (fourteen) days.   gabapentin (NEURONTIN) 300 MG capsule TAKE 1 CAPSULE BY MOUTH NIGHTLY AT BEDTIME FOR 7 DAYS, INCREASE TO 2 CAPS AT BEDTIME FOR NEUROPATHY, APPT  TIME AND LABS PLEASE (Patient taking differently: 600 mg at bedtime.)   levothyroxine  (SYNTHROID ) 125 MCG tablet TAKE 1 TABLET BY MOUTH DAILY BEFORE BREAKFAST.   nicotine  (NICODERM CQ  - DOSED IN MG/24 HOURS) 14 mg/24hr patch Place 1 patch (14 mg total) onto the skin daily.   Semaglutide -Weight Management (WEGOVY ) 1 MG/0.5ML SOAJ Inject 1 mg into the skin once a week.   Syringe/Needle, Disp, (SYRINGE 3CC/20GX1") 20G X 1" 3 ML MISC 1 each by Does not apply route every 14  (fourteen) days. For use with B12 injections   No facility-administered encounter medications on file as of 05/17/2023.    Social History: Social History   Tobacco Use   Smoking status: Every Day    Current packs/day: 1.00    Average packs/day: 1 pack/day for 10.0 years (10.0 ttl pk-yrs)    Types: Cigarettes    Passive exposure: Past   Smokeless tobacco: Never  Vaping Use   Vaping status: Former  Substance Use Topics   Alcohol use: Yes    Comment: rare   Drug use: No    Family Medical History: Family History  Problem Relation Age of Onset   Diabetes Mother    Hypertension Father    Breast cancer Neg Hx     Physical Examination: There were no vitals filed for this visit.    Awake, alert, oriented to person, place, and time.  Speech is clear and fluent. Fund of knowledge is appropriate.   Cranial Nerves: Pupils equal round and reactive to light.  Facial tone is symmetric.    No abnormal lesions on exposed skin.   Strength: Side Iliopsoas Quads Hamstring PF DF EHL  R 5 5 5 5 5 5   L 5 5 5 5 5 5      Clonus is not present.   Bilateral lower extremity sensation is intact to light touch.     Has known lymphedema in both legs.   Gait is slow.   Medical Decision Making  Imaging: None   Assessment and Plan: [name] continues with continues with constant right sided LBP with right lateral/posterior leg pain to her foot. No left leg pain.   She has known DDD L4-S1, worse at L5-S1 with slight slip. Alos with mild bilateral foraminal stenosis L4-L5 and right disc L5-S1 that compresses S1 nerve with mild right and mild/moderate left foraminal stenosis.   Treatment options discussed with patient and following plan made:   - Will reorder PT for lumbar spine at [facility]. She will let me know if this is denied.  - She is doing great on weight loss. She will continue to work on this.  - She will continue to work on smoking cessation.  - If PT is approved, will  revisit injections with Dr. Rhesa Hill.  - Follow up with me in 2-3 months and prn.   I spent a total of 15 minutes in face-to-face and non-face-to-face activities related to this patient's care today including review of outside records, review of imaging, review of symptoms, physical exam, discussion of differential diagnosis, discussion of treatment options, and documentation.   Lucetta Russel PA-C Dept. of Neurosurgery

## 2023-05-13 NOTE — Telephone Encounter (Signed)
 Ok, I will send this note to our insurance person. She will see it Monday.

## 2023-05-17 ENCOUNTER — Encounter: Payer: Self-pay | Admitting: Orthopedic Surgery

## 2023-05-17 ENCOUNTER — Ambulatory Visit (INDEPENDENT_AMBULATORY_CARE_PROVIDER_SITE_OTHER): Admitting: Orthopedic Surgery

## 2023-05-17 VITALS — BP 110/62 | Ht 66.0 in | Wt 233.6 lb

## 2023-05-17 DIAGNOSIS — M5127 Other intervertebral disc displacement, lumbosacral region: Secondary | ICD-10-CM | POA: Diagnosis not present

## 2023-05-17 DIAGNOSIS — M5126 Other intervertebral disc displacement, lumbar region: Secondary | ICD-10-CM

## 2023-05-17 DIAGNOSIS — M4726 Other spondylosis with radiculopathy, lumbar region: Secondary | ICD-10-CM

## 2023-05-17 DIAGNOSIS — M48061 Spinal stenosis, lumbar region without neurogenic claudication: Secondary | ICD-10-CM | POA: Diagnosis not present

## 2023-05-17 DIAGNOSIS — M5416 Radiculopathy, lumbar region: Secondary | ICD-10-CM

## 2023-05-17 DIAGNOSIS — M51362 Other intervertebral disc degeneration, lumbar region with discogenic back pain and lower extremity pain: Secondary | ICD-10-CM

## 2023-05-17 DIAGNOSIS — M47816 Spondylosis without myelopathy or radiculopathy, lumbar region: Secondary | ICD-10-CM

## 2023-05-17 NOTE — Patient Instructions (Signed)
 It was so nice to see you today. Thank you so much for coming in.    You are doing amazing with weight loss- keep it up! Continue to work on quitting smoking as well.   I put in orders for PT for you to see [facility] in [place]. They should call you to schedule a visit. You can call them at [phone] if you don't hear anything. They are located at [address], [address].   Let me know if PT is denied by your insurance.   I will see you back in 2-3 months. Please do not hesitate to call if you have any questions or concerns. You can also message me in MyChart.   Lucetta Russel PA-C [phone]     The physicians and staff at [facility] Neurosurgery at [facility] are committed to providing excellent care. You may receive a survey asking for feedback about your experience at our office. We value you your feedback and appreciate you taking the time to to fill it out. The [facility] leadership team is also available to discuss your experience in person, feel free to contact us  [phone].

## 2023-05-23 ENCOUNTER — Ambulatory Visit

## 2023-05-23 ENCOUNTER — Ambulatory Visit (INDEPENDENT_AMBULATORY_CARE_PROVIDER_SITE_OTHER): Admitting: Surgery

## 2023-05-23 ENCOUNTER — Encounter: Payer: Self-pay | Admitting: Surgery

## 2023-05-23 VITALS — BP 109/57 | HR 105 | Temp 98.2°F | Ht 66.0 in | Wt 232.0 lb

## 2023-05-23 DIAGNOSIS — K436 Other and unspecified ventral hernia with obstruction, without gangrene: Secondary | ICD-10-CM

## 2023-05-23 NOTE — Patient Instructions (Signed)
Follow up here in 1 month.     Please call and ask to speak with a nurse if you develop questions or concerns.

## 2023-05-24 ENCOUNTER — Ambulatory Visit: Admitting: Gastroenterology

## 2023-05-25 NOTE — Progress Notes (Signed)
 Outpatient Surgical Follow Up   Karen Hill is an 48 y.o. female.   Chief Complaint  Patient presents with   Follow-up    HPI: Karen Hill is a 48 y.o. female seen in follow up for symptomatic and worsening ventral hernia.  She did have a history of cholecystectomy as well as C-section in the past and tubal ligation.  She thinks that this might be a recurrent.  she has been having more abdominal pain that is moderate intermittent and worsening with certain meals.  The hernia has progressed to the point that now is not reducible.  She does have daily symptoms.  She continues to smoke but is cutting back.  He does have some chronic pulmonary issues.  SHe did have a recent CT scan that have personally reviewed showing evidence of a large ventral hernia with some loss of domain and chronically incarcerated bowel\ She has been compliant with everything that I have asked her to do, including involving her family in the process and losing weight. SHe continues to smoke but I think this might be a more difficult issue   She does have some chronic pain issues and will see pain management team in the next few weeks. 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  Past Medical History:  Diagnosis Date   Liver hemangioma    Lymphedema    Thyroid disease     Past Surgical History:  Procedure Laterality Date   CESAREAN SECTION  2014   CHOLECYSTECTOMY  2005   COLONOSCOPY WITH PROPOFOL  N/A 03/09/2023   Procedure: COLONOSCOPY WITH PROPOFOL ;  Surgeon: Luke Salaam, MD;  Location: [facility] ENDOSCOPY;  Service: Gastroenterology;  Laterality: N/A;   MANDIBLE FRACTURE SURGERY     MULTIPLE TOOTH EXTRACTIONS     TUBAL LIGATION  2014    Family History  Problem Relation Age of Onset   Diabetes Mother    Hypertension Father    Breast cancer Neg Hx     Social History:  reports that she has been smoking cigarettes. She has a 10 pack-year smoking history. She has been exposed to tobacco  smoke. She has never used smokeless tobacco. She reports current alcohol use. She reports that she does not use drugs.  Allergies:  Allergies  Allergen Reactions   Penicillin G Hives and Itching   Amoxicillin    Shellfish Allergy Nausea And Vomiting    Medications reviewed.    ROS Full ROS performed and is otherwise negative other than what is stated in HPI   BP (!) 109/57 (BP Location: Left Arm, Patient Position: Sitting, Cuff Size: Large)   Pulse (!) 105   Temp 98.2 F (36.8 C)   Ht 5\' 6"  (1.676 m)   Wt 232 lb (105.2 kg)   LMP 04/26/2023 (Exact Date)   SpO2 98%   BMI 37.45 kg/m   Physical Exam CONSTITUTIONAL: NAD BMI 37. EYES: Pupils are equal, round, and reactive to light, Sclera are non-icteric. EARS, NOSE, MOUTH AND THROAT: The oropharynx is clear. The oral mucosa is pink and moist. Hearing is intact to voice. LYMPH NODES:  Lymph nodes in the neck are normal. RESPIRATORY:  Lungs are clear. There is normal respiratory effort, with equal breath sounds bilaterally, and without pathologic use of accessory muscles. CARDIOVASCULAR: Heart is regular without murmurs, gallops, or rubs. GI: The abdomen is  soft, large chronically incarcerated ventral hernia between umbilicus and xiphoid. Tender to palpation. No peritonitis, there is loss of domain. There are  no palpable masses. There is no hepatosplenomegaly. There are normal bowel sounds GU: Rectal deferred.   MUSCULOSKELETAL: Normal muscle strength and tone. No cyanosis or edema.   SKIN: Turgor is good and there are no pathologic skin lesions or ulcers. NEUROLOGIC: Motor and sensation is grossly normal. Cranial nerves are grossly intact. PSYCH:  Oriented to person, place and time. Affect is normal.    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  She does have multiple risk factor including a  high BMI now improved and at 36 as well as active smoker.  I had  an extensive discussion with the patient  about her disease process.  Unfortunately her symptoms are crescendo to the point that her hernia is currently not reducible. Currently there is no signs of strangulation.I Do think that she is between a rock and a hard place and we will need to perform her surgery before she develops a catastrophic complication..   I did have a very frank discussion with the pt .  In my experience these are very difficult cases because we are unable to completely optimize theses pts; and that usually they present with a strangulation that significantly compromises the abdominal wall and also increase tremendously the duration of complications. I think we are ready for potential intervention and we will tentatively do it next month She is certainly at increased risk of developing perioperative complication including recurrence and infection.   We could potentially perform a robotic repair component separation release.  Sometimes these are very challenging cases that may require open intervention. Again I counseled her about smoking cessation  and she is going to try to do her best.  Note that I spent  40 minutes in this encounter including personally reviewing imaging studies, coordinating her care, placing orders and performing documentation    Evelia Hipp, MD [facility] General Surgeon

## 2023-05-27 ENCOUNTER — Other Ambulatory Visit: Payer: Self-pay | Admitting: Family

## 2023-05-30 ENCOUNTER — Ambulatory Visit: Payer: Medicaid Other | Admitting: Family

## 2023-06-01 ENCOUNTER — Encounter: Payer: Self-pay | Admitting: Family

## 2023-06-01 ENCOUNTER — Ambulatory Visit: Admitting: Family

## 2023-06-01 DIAGNOSIS — E559 Vitamin D deficiency, unspecified: Secondary | ICD-10-CM

## 2023-06-01 DIAGNOSIS — E039 Hypothyroidism, unspecified: Secondary | ICD-10-CM

## 2023-06-01 DIAGNOSIS — R5383 Other fatigue: Secondary | ICD-10-CM

## 2023-06-01 DIAGNOSIS — Z0130 Encounter for examination of blood pressure without abnormal findings: Secondary | ICD-10-CM

## 2023-06-01 DIAGNOSIS — R7303 Prediabetes: Secondary | ICD-10-CM

## 2023-06-01 DIAGNOSIS — E538 Deficiency of other specified B group vitamins: Secondary | ICD-10-CM | POA: Diagnosis not present

## 2023-06-01 DIAGNOSIS — Z01818 Encounter for other preprocedural examination: Secondary | ICD-10-CM | POA: Diagnosis not present

## 2023-06-01 DIAGNOSIS — E782 Mixed hyperlipidemia: Secondary | ICD-10-CM | POA: Diagnosis not present

## 2023-06-01 DIAGNOSIS — Z1159 Encounter for screening for other viral diseases: Secondary | ICD-10-CM

## 2023-06-01 MED ORDER — LIDOCAINE VISCOUS HCL 2 % MT SOLN: 15.0000 mL | OROMUCOSAL | 0 refills | Status: DC | PRN

## 2023-06-01 MED ORDER — NYSTATIN 100000 UNIT/ML MT SUSP: 5.0000 mL | Freq: Three times a day (TID) | OROMUCOSAL | 0 refills | Status: DC | PRN

## 2023-06-01 MED ORDER — AZELASTINE HCL 0.1 % NA SOLN: 1.0000 | Freq: Two times a day (BID) | NASAL | 12 refills | Status: DC

## 2023-06-01 MED ORDER — FEXOFENADINE HCL 180 MG PO TABS: 180.0000 mg | ORAL_TABLET | Freq: Every day | ORAL | 1 refills | Status: DC

## 2023-06-01 NOTE — Progress Notes (Signed)
 Established Patient Office Visit  Subjective:  Patient ID: Karen Hill, female    DOB: 15-Feb-1975  Age: 48 y.o. MRN: [id number]  Chief Complaint  Patient presents with   Follow-up    3 month follow up    Patient is here today for her 3 months follow up.  She has been feeling poorly since last appointment.   She does have additional concerns to discuss today. Sore throat, feels similar to other times she has had a sore throat, but it stings and burns when she first wakes up in the mornings, throat feels like it is very dry.  Started a few weeks ago.   She does endorse severe allergy symptoms, has not been taking anything yet.   Is getting ready to get set up for her surgery when she sees them next month.    Labs are due today. She needs refills.   I have reviewed her active problem list, medication list, allergies, health maintenance, notes from last encounter, lab results for her appointment today.      No other concerns at this time.   Past Medical History:  Diagnosis Date   Liver hemangioma    Lymphedema    Thyroid disease     Past Surgical History:  Procedure Laterality Date   CESAREAN SECTION  2014   CHOLECYSTECTOMY  2005   COLONOSCOPY WITH PROPOFOL  N/A 03/09/2023   Procedure: COLONOSCOPY WITH PROPOFOL ;  Surgeon: Luke Salaam, MD;  Location: [facility];  Service: Gastroenterology;  Laterality: N/A;   MANDIBLE FRACTURE SURGERY     MULTIPLE TOOTH EXTRACTIONS     TUBAL LIGATION  2014    Social History   Socioeconomic History   Marital status: Married    Spouse name: Not on file   Number of children: Not on file   Years of education: Not on file   Highest education level: Not on file  Occupational History   Not on file  Tobacco Use   Smoking status: Every Day    Current packs/day: 1.00    Average packs/day: 1 pack/day for 10.0 years (10.0 ttl pk-yrs)    Types: Cigarettes    Passive exposure: Past   Smokeless tobacco: Never  Vaping Use    Vaping status: Former  Substance and Sexual Activity   Alcohol use: Yes    Comment: rare   Drug use: No   Sexual activity: Not on file  Other Topics Concern   Not on file  Social History Narrative   Not on file   Social Drivers of Health   Financial Resource Strain: Not on file  Food Insecurity: Not on file  Transportation Needs: Not on file  Physical Activity: Not on file  Stress: Not on file  Social Connections: Not on file  Intimate Partner Violence: Not on file    Family History  Problem Relation Age of Onset   Diabetes Mother    Hypertension Father    Breast cancer Neg Hx     Allergies  Allergen Reactions   Penicillin G Hives and Itching   Amoxicillin    Shellfish Allergy Nausea And Vomiting    Review of Systems  HENT:  Positive for congestion, sinus pain and sore throat.   Gastrointestinal:  Positive for nausea and vomiting.  All other systems reviewed and are negative.      Objective:   BP 102/76   Pulse (!) 105   Ht 5\' 6"  (1.676 m)   Wt 228 lb 3.2 oz (103.5  kg)   LMP 04/26/2023 (Exact Date)   SpO2 95%   BMI 36.83 kg/m   Vitals:   06/01/23 0956  BP: 102/76  Pulse: (!) 105  Height: 5\' 6"  (1.676 m)  Weight: 228 lb 3.2 oz (103.5 kg)  SpO2: 95%  BMI (Calculated): 36.85    Physical Exam Vitals and nursing note reviewed.  Constitutional:      Appearance: Normal appearance. She is obese.  HENT:     Head: Normocephalic and atraumatic.  Eyes:     Extraocular Movements: Extraocular movements intact.     Conjunctiva/sclera: Conjunctivae normal.     Pupils: Pupils are equal, round, and reactive to light.  Cardiovascular:     Rate and Rhythm: Normal rate and regular rhythm.     Pulses: Normal pulses.  Pulmonary:     Effort: Pulmonary effort is normal.     Breath sounds: Normal breath sounds.  Musculoskeletal:        General: Normal range of motion.  Neurological:     General: No focal deficit present.     Mental Status: She is alert and  oriented to person, place, and time. Mental status is at baseline.  Psychiatric:        Mood and Affect: Mood normal.        Behavior: Behavior normal.        Thought Content: Thought content normal.        Judgment: Judgment normal.      Results for orders placed or performed in visit on 06/01/23  CMP14+EGFR  Result Value Ref Range   Glucose 76 70 - 99 mg/dL   BUN 6 6 - 24 mg/dL   Creatinine, Ser 1.61 0.57 - 1.00 mg/dL   eGFR 89 >09 UE/AVW/0.98   BUN/Creatinine Ratio 7 (L) 9 - 23   Sodium 140 134 - 144 mmol/L   Potassium 4.6 3.5 - 5.2 mmol/L   Chloride 101 96 - 106 mmol/L   CO2 24 20 - 29 mmol/L   Calcium 9.2 8.7 - 10.2 mg/dL   Total Protein 6.6 6.0 - 8.5 g/dL   Albumin 3.8 (L) 3.9 - 4.9 g/dL   Globulin, Total 2.8 1.5 - 4.5 g/dL   Bilirubin Total 1.1 0.0 - 1.2 mg/dL   Alkaline Phosphatase 119 44 - 121 IU/L   AST 17 0 - 40 IU/L   ALT 9 0 - 32 IU/L  Lipid panel  Result Value Ref Range   Cholesterol, Total 182 100 - 199 mg/dL   Triglycerides 119 0 - 149 mg/dL   HDL 34 (L) >14 mg/dL   VLDL Cholesterol Cal 26 5 - 40 mg/dL   LDL Chol Calc (NIH) 782 (H) 0 - 99 mg/dL   Chol/HDL Ratio 5.4 (H) 0.0 - 4.4 ratio  Iron, TIBC and Ferritin Panel  Result Value Ref Range   Total Iron Binding Capacity 312 250 - 450 ug/dL   UIBC 956 213 - 086 ug/dL   Iron 55 27 - 578 ug/dL   Iron Saturation 18 15 - 55 %   Ferritin 74 15 - 150 ng/mL  VITAMIN D  25 Hydroxy (Vit-D Deficiency, Fractures)  Result Value Ref Range   Vit D, 25-Hydroxy 37.1 30.0 - 100.0 ng/mL  Vitamin B12  Result Value Ref Range   Vitamin B-12 500 232 - 1,245 pg/mL  CBC with Diff  Result Value Ref Range   WBC 6.5 3.4 - 10.8 x10E3/uL   RBC 4.86 3.77 - 5.28 x10E6/uL   Hemoglobin 15.2  11.1 - 15.9 g/dL   Hematocrit 16.1 09.6 - 46.6 %   MCV 93 79 - 97 fL   MCH 31.3 26.6 - 33.0 pg   MCHC 33.8 31.5 - 35.7 g/dL   RDW 04.5 40.9 - 81.1 %   Platelets 221 150 - 450 x10E3/uL   Neutrophils 60 Not Estab. %   Lymphs 33 Not Estab. %    Monocytes 6 Not Estab. %   Eos 1 Not Estab. %   Basos 0 Not Estab. %   Neutrophils Absolute 3.9 1.4 - 7.0 x10E3/uL   Lymphocytes Absolute 2.2 0.7 - 3.1 x10E3/uL   Monocytes Absolute 0.4 0.1 - 0.9 x10E3/uL   EOS (ABSOLUTE) 0.1 0.0 - 0.4 x10E3/uL   Basophils Absolute 0.0 0.0 - 0.2 x10E3/uL   Immature Granulocytes 0 Not Estab. %   Immature Grans (Abs) 0.0 0.0 - 0.1 x10E3/uL  Hemoglobin A1c  Result Value Ref Range   Hgb A1c MFr Bld 4.8 4.8 - 5.6 %   Est. average glucose Bld gHb Est-mCnc 91 mg/dL  [id number]  Result Value Ref Range   TSH 0.008 (L) 0.450 - 4.500 uIU/mL   T3, Free 3.9 2.0 - 4.4 pg/mL   Free T4 1.91 (H) 0.82 - 1.77 ng/dL    Recent Results (from the past 2160 hours)  CMP14+EGFR     Status: Abnormal   Collection Time: 06/01/23 10:41 AM  Result Value Ref Range   Glucose 76 70 - 99 mg/dL   BUN 6 6 - 24 mg/dL   Creatinine, Ser 0.86 0.57 - 1.00 mg/dL   eGFR 89 >57 QI/ONG/2.95   BUN/Creatinine Ratio 7 (L) 9 - 23   Sodium 140 134 - 144 mmol/L   Potassium 4.6 3.5 - 5.2 mmol/L   Chloride 101 96 - 106 mmol/L   CO2 24 20 - 29 mmol/L   Calcium 9.2 8.7 - 10.2 mg/dL   Total Protein 6.6 6.0 - 8.5 g/dL   Albumin 3.8 (L) 3.9 - 4.9 g/dL   Globulin, Total 2.8 1.5 - 4.5 g/dL   Bilirubin Total 1.1 0.0 - 1.2 mg/dL   Alkaline Phosphatase 119 44 - 121 IU/L   AST 17 0 - 40 IU/L   ALT 9 0 - 32 IU/L  Lipid panel     Status: Abnormal   Collection Time: 06/01/23 10:41 AM  Result Value Ref Range   Cholesterol, Total 182 100 - 199 mg/dL   Triglycerides 284 0 - 149 mg/dL   HDL 34 (L) >13 mg/dL   VLDL Cholesterol Cal 26 5 - 40 mg/dL   LDL Chol Calc (NIH) 244 (H) 0 - 99 mg/dL   Chol/HDL Ratio 5.4 (H) 0.0 - 4.4 ratio    Comment:                                   T. Chol/HDL Ratio                                             Men  Women                               1/2 Avg.Risk  3.4    3.3  Avg.Risk  5.0    4.4                                2X  Avg.Risk  9.6    7.1                                3X Avg.Risk 23.4   11.0   Iron, TIBC and Ferritin Panel     Status: None   Collection Time: 06/01/23 10:41 AM  Result Value Ref Range   Total Iron Binding Capacity 312 250 - 450 ug/dL   UIBC 130 865 - 784 ug/dL   Iron 55 27 - 696 ug/dL   Iron Saturation 18 15 - 55 %   Ferritin 74 15 - 150 ng/mL  VITAMIN D  25 Hydroxy (Vit-D Deficiency, Fractures)     Status: None   Collection Time: 06/01/23 10:41 AM  Result Value Ref Range   Vit D, 25-Hydroxy 37.1 30.0 - 100.0 ng/mL    Comment: Vitamin D  deficiency has been defined by the Institute of Medicine and an Endocrine Society practice guideline as a level of serum 25-OH vitamin D  less than 20 ng/mL (1,2). The Endocrine Society went on to further define vitamin D  insufficiency as a level between 21 and 29 ng/mL (2). 1. IOM (Institute of Medicine). 2010. Dietary reference    intakes for calcium and D. [place]: The    [employer]. 2. Holick MF, Binkley [place], Bischoff-Ferrari HA, et al.    Evaluation, treatment, and prevention of vitamin D     deficiency: an Endocrine Society clinical practice    guideline. JCEM. 2011 Jul; 96(7):1911-30.   Vitamin B12     Status: None   Collection Time: 06/01/23 10:41 AM  Result Value Ref Range   Vitamin B-12 500 232 - 1,245 pg/mL  CBC with Diff     Status: None   Collection Time: 06/01/23 10:41 AM  Result Value Ref Range   WBC 6.5 3.4 - 10.8 x10E3/uL   RBC 4.86 3.77 - 5.28 x10E6/uL   Hemoglobin 15.2 11.1 - 15.9 g/dL   Hematocrit 29.5 28.4 - 46.6 %   MCV 93 79 - 97 fL   MCH 31.3 26.6 - 33.0 pg   MCHC 33.8 31.5 - 35.7 g/dL   RDW 13.2 44.0 - 10.2 %   Platelets 221 150 - 450 x10E3/uL   Neutrophils 60 Not Estab. %   Lymphs 33 Not Estab. %   Monocytes 6 Not Estab. %   Eos 1 Not Estab. %   Basos 0 Not Estab. %   Neutrophils Absolute 3.9 1.4 - 7.0 x10E3/uL   Lymphocytes Absolute 2.2 0.7 - 3.1 x10E3/uL   Monocytes Absolute 0.4 0.1 - 0.9  x10E3/uL   EOS (ABSOLUTE) 0.1 0.0 - 0.4 x10E3/uL   Basophils Absolute 0.0 0.0 - 0.2 x10E3/uL   Immature Granulocytes 0 Not Estab. %   Immature Grans (Abs) 0.0 0.0 - 0.1 x10E3/uL  Hemoglobin A1c     Status: None   Collection Time: 06/01/23 10:41 AM  Result Value Ref Range   Hgb A1c MFr Bld 4.8 4.8 - 5.6 %    Comment:          Prediabetes: 5.7 - 6.4          Diabetes: >6.4          Glycemic control for  adults with diabetes: <7.0    Est. average glucose Bld gHb Est-mCnc 91 mg/dL  [id number]     Status: Abnormal   Collection Time: 06/01/23 10:41 AM  Result Value Ref Range   TSH 0.008 (L) 0.450 - 4.500 uIU/mL   T3, Free 3.9 2.0 - 4.4 pg/mL   Free T4 1.91 (H) 0.82 - 1.77 ng/dL       Assessment & Plan:   Problem List Items Addressed This Visit       Endocrine   Hypothyroidism, adult   Checking labs today.  Will continue supplements as needed.        Relevant Orders   CMP14+EGFR (Completed)   CBC with Diff (Completed)   TSH+T4F+T3Free (Completed)     Other   Prediabetes   A1C Continues to be in prediabetic ranges.  Will reassess at follow up after next lab check.  Patient counseled on dietary choices and verbalized understanding.  Patient educated on foods that contain carbohydrates and the need to decrease intake.  We discussed prediabetes, and what it means and the need for strict dietary control to prevent progression to type 2 diabetes.  Advised to decrease intake of sugary drinks, including sodas, sweet tea, and some juices, and of starch and sugar heavy foods (ie., potatoes, rice, bread, pasta, desserts). She verbalizes understanding and agreement with the changes discussed today.        Relevant Orders   CMP14+EGFR (Completed)   CBC with Diff (Completed)   Hemoglobin A1c (Completed)   Other fatigue   Checking labs today.  Will continue supplements as needed.        Relevant Orders   CMP14+EGFR (Completed)   Iron, TIBC and Ferritin Panel (Completed)    CBC with Diff (Completed)   Other Visit Diagnoses       Encounter for preoperative examination for general surgical procedure       Patient is cleared for surgery. No additional testing needed at this time   Relevant Orders   CMP14+EGFR (Completed)   CBC with Diff (Completed)     B12 deficiency due to diet       Checking labs today.  Will continue supplements as needed.   Relevant Orders   CMP14+EGFR (Completed)   Vitamin B12 (Completed)   CBC with Diff (Completed)     Vitamin D  deficiency, unspecified       Checking labs today.  Will continue supplements as needed.   Relevant Orders   CMP14+EGFR (Completed)   VITAMIN D  25 Hydroxy (Vit-D Deficiency, Fractures) (Completed)   CBC with Diff (Completed)     Need for hepatitis C screening test       Test ordered in office today. Will call with results.   Relevant Orders   CMP14+EGFR (Completed)   CBC with Diff (Completed)     Mixed hyperlipidemia       Checking labs today.  Continue current therapy for lipid control. Will modify as needed based on labwork results.   Relevant Orders   CMP14+EGFR (Completed)   Lipid panel (Completed)   CBC with Diff (Completed)       Return in about 3 months (around 09/01/2023) for F/U.   Total time spent: 20 minutes  Trenda Frisk, FNP  06/01/2023   This document may have been prepared by [facility] Voice Recognition software and as such may include unintentional dictation errors.

## 2023-06-02 ENCOUNTER — Other Ambulatory Visit: Payer: Self-pay

## 2023-06-02 ENCOUNTER — Ambulatory Visit: Payer: Self-pay

## 2023-06-02 LAB — IRON,TIBC AND FERRITIN PANEL
Ferritin: 74 ng/mL (ref 15–150)
Iron Saturation: 18 % (ref 15–55)
Iron: 55 ug/dL (ref 27–159)
Total Iron Binding Capacity: 312 ug/dL (ref 250–450)
UIBC: 257 ug/dL (ref 131–425)

## 2023-06-02 LAB — CMP14+EGFR
ALT: 9 IU/L (ref 0–32)
AST: 17 IU/L (ref 0–40)
Albumin: 3.8 g/dL — ABNORMAL LOW (ref 3.9–4.9)
Alkaline Phosphatase: 119 IU/L (ref 44–121)
BUN/Creatinine Ratio: 7 — ABNORMAL LOW (ref 9–23)
BUN: 6 mg/dL (ref 6–24)
Bilirubin Total: 1.1 mg/dL (ref 0.0–1.2)
CO2: 24 mmol/L (ref 20–29)
Calcium: 9.2 mg/dL (ref 8.7–10.2)
Chloride: 101 mmol/L (ref 96–106)
Creatinine, Ser: 0.82 mg/dL (ref 0.57–1.00)
Globulin, Total: 2.8 g/dL (ref 1.5–4.5)
Glucose: 76 mg/dL (ref 70–99)
Potassium: 4.6 mmol/L (ref 3.5–5.2)
Sodium: 140 mmol/L (ref 134–144)
Total Protein: 6.6 g/dL (ref 6.0–8.5)
eGFR: 89 mL/min/{1.73_m2} (ref >59)

## 2023-06-02 LAB — HEMOGLOBIN A1C
Est. average glucose Bld gHb Est-mCnc: 91 mg/dL
Hgb A1c MFr Bld: 4.8 % (ref 4.8–5.6)

## 2023-06-02 LAB — LIPID PANEL
Chol/HDL Ratio: 5.4 ratio — ABNORMAL HIGH (ref 0.0–4.4)
Cholesterol, Total: 182 mg/dL (ref 100–199)
HDL: 34 mg/dL — ABNORMAL LOW (ref >39)
LDL Chol Calc (NIH): 122 mg/dL — ABNORMAL HIGH (ref 0–99)
Triglycerides: 144 mg/dL (ref 0–149)
VLDL Cholesterol Cal: 26 mg/dL (ref 5–40)

## 2023-06-02 LAB — VITAMIN B12: Vitamin B-12: 500 pg/mL (ref 232–1245)

## 2023-06-02 LAB — TSH+T4F+T3FREE
Free T4: 1.91 ng/dL — ABNORMAL HIGH (ref 0.82–1.77)
T3, Free: 3.9 pg/mL (ref 2.0–4.4)
TSH: 0.008 u[IU]/mL — ABNORMAL LOW (ref 0.450–4.500)

## 2023-06-02 LAB — VITAMIN D 25 HYDROXY (VIT D DEFICIENCY, FRACTURES): Vit D, 25-Hydroxy: 37.1 ng/mL (ref 30.0–100.0)

## 2023-06-02 LAB — CBC WITH DIFFERENTIAL/PLATELET
Basophils Absolute: 0 10*3/uL (ref 0.0–0.2)
Basos: 0 %
EOS (ABSOLUTE): 0.1 10*3/uL (ref 0.0–0.4)
Eos: 1 %
Hematocrit: 45 % (ref 34.0–46.6)
Hemoglobin: 15.2 g/dL (ref 11.1–15.9)
Immature Grans (Abs): 0 10*3/uL (ref 0.0–0.1)
Immature Granulocytes: 0 %
Lymphocytes Absolute: 2.2 10*3/uL (ref 0.7–3.1)
Lymphs: 33 %
MCH: 31.3 pg (ref 26.6–33.0)
MCHC: 33.8 g/dL (ref 31.5–35.7)
MCV: 93 fL (ref 79–97)
Monocytes Absolute: 0.4 10*3/uL (ref 0.1–0.9)
Monocytes: 6 %
Neutrophils Absolute: 3.9 10*3/uL (ref 1.4–7.0)
Neutrophils: 60 %
Platelets: 221 10*3/uL (ref 150–450)
RBC: 4.86 x10E6/uL (ref 3.77–5.28)
RDW: 12.4 % (ref 11.7–15.4)
WBC: 6.5 10*3/uL (ref 3.4–10.8)

## 2023-06-02 MED ORDER — ROSUVASTATIN CALCIUM 5 MG PO TABS: 5.0000 mg | ORAL_TABLET | Freq: Every day | ORAL | 0 refills | Status: DC

## 2023-06-02 NOTE — Assessment & Plan Note (Signed)
 Checking labs today.  Will continue supplements as needed.

## 2023-06-02 NOTE — Assessment & Plan Note (Signed)

## 2023-06-03 NOTE — Addendum Note (Signed)
 Addended by: Evander Hills on: 06/03/2023 09:15 AM   Modules accepted: Orders

## 2023-06-07 ENCOUNTER — Encounter: Payer: Self-pay | Admitting: Family

## 2023-06-07 MED ORDER — NYSTATIN 100000 UNIT/ML MT SUSP: 5.0000 mL | Freq: Four times a day (QID) | OROMUCOSAL | 0 refills | Status: DC

## 2023-06-07 MED ORDER — LIDOCAINE VISCOUS HCL 2 % MT SOLN: 15.0000 mL | Freq: Four times a day (QID) | OROMUCOSAL | 0 refills | Status: DC | PRN

## 2023-06-13 ENCOUNTER — Encounter: Payer: Self-pay | Admitting: Gastroenterology

## 2023-06-14 ENCOUNTER — Encounter: Payer: Self-pay | Admitting: Family

## 2023-06-14 ENCOUNTER — Other Ambulatory Visit: Payer: Self-pay

## 2023-06-14 ENCOUNTER — Other Ambulatory Visit: Payer: Self-pay | Admitting: Gastroenterology

## 2023-06-14 DIAGNOSIS — Z1211 Encounter for screening for malignant neoplasm of colon: Secondary | ICD-10-CM

## 2023-06-15 ENCOUNTER — Other Ambulatory Visit

## 2023-06-20 ENCOUNTER — Other Ambulatory Visit: Payer: Self-pay

## 2023-06-20 DIAGNOSIS — F840 Autistic disorder: Secondary | ICD-10-CM

## 2023-06-26 ENCOUNTER — Encounter: Payer: Self-pay | Admitting: Family

## 2023-06-27 ENCOUNTER — Emergency Department
Admission: EM | Admit: 2023-06-27 | Discharge: 2023-06-27 | Disposition: A | Discharge status: Home / Self Care | Attending: Emergency Medicine | Admitting: Emergency Medicine

## 2023-06-27 ENCOUNTER — Emergency Department

## 2023-06-27 ENCOUNTER — Encounter: Payer: Self-pay | Admitting: Surgery

## 2023-06-27 ENCOUNTER — Ambulatory Visit (INDEPENDENT_AMBULATORY_CARE_PROVIDER_SITE_OTHER): Admitting: Surgery

## 2023-06-27 ENCOUNTER — Other Ambulatory Visit: Payer: Self-pay

## 2023-06-27 ENCOUNTER — Telehealth: Payer: Self-pay | Admitting: Surgery

## 2023-06-27 ENCOUNTER — Other Ambulatory Visit: Payer: Self-pay | Admitting: Family

## 2023-06-27 VITALS — BP 110/60 | HR 100 | Temp 97.9°F | Ht 66.0 in | Wt 219.0 lb

## 2023-06-27 DIAGNOSIS — K430 Incisional hernia with obstruction, without gangrene: Secondary | ICD-10-CM

## 2023-06-27 DIAGNOSIS — R112 Nausea with vomiting, unspecified: Secondary | ICD-10-CM | POA: Diagnosis not present

## 2023-06-27 DIAGNOSIS — E876 Hypokalemia: Secondary | ICD-10-CM | POA: Insufficient documentation

## 2023-06-27 DIAGNOSIS — D72829 Elevated white blood cell count, unspecified: Secondary | ICD-10-CM | POA: Insufficient documentation

## 2023-06-27 DIAGNOSIS — R1110 Vomiting, unspecified: Secondary | ICD-10-CM | POA: Diagnosis present

## 2023-06-27 DIAGNOSIS — K436 Other and unspecified ventral hernia with obstruction, without gangrene: Secondary | ICD-10-CM

## 2023-06-27 DIAGNOSIS — E039 Hypothyroidism, unspecified: Secondary | ICD-10-CM | POA: Insufficient documentation

## 2023-06-27 LAB — URINALYSIS, ROUTINE W REFLEX MICROSCOPIC
Bilirubin Urine: NEGATIVE
Glucose, UA: NEGATIVE mg/dL
Ketones, ur: NEGATIVE mg/dL
Nitrite: NEGATIVE
Protein, ur: NEGATIVE mg/dL
Specific Gravity, Urine: 1.012 (ref 1.005–1.030)
pH: 6 (ref 5.0–8.0)

## 2023-06-27 LAB — CBC
HCT: 43.8 % (ref 36.0–46.0)
Hemoglobin: 14.4 g/dL (ref 12.0–15.0)
MCH: 31.2 pg (ref 26.0–34.0)
MCHC: 32.9 g/dL (ref 30.0–36.0)
MCV: 95 fL (ref 80.0–100.0)
Platelets: 216 10*3/uL (ref 150–400)
RBC: 4.61 MIL/uL (ref 3.87–5.11)
RDW: 13.3 % (ref 11.5–15.5)
WBC: 7.3 10*3/uL (ref 4.0–10.5)
nRBC: 0 % (ref 0.0–0.2)

## 2023-06-27 LAB — COMPREHENSIVE METABOLIC PANEL WITH GFR
ALT: 11 U/L (ref 0–44)
AST: 20 U/L (ref 15–41)
Albumin: 3.2 g/dL — ABNORMAL LOW (ref 3.5–5.0)
Alkaline Phosphatase: 69 U/L (ref 38–126)
Anion gap: 10 (ref 5–15)
BUN: <5 mg/dL — ABNORMAL LOW (ref 6–20)
CO2: 24 mmol/L (ref 22–32)
Calcium: 8.8 mg/dL — ABNORMAL LOW (ref 8.9–10.3)
Chloride: 106 mmol/L (ref 98–111)
Creatinine, Ser: 0.57 mg/dL (ref 0.44–1.00)
GFR, Estimated: >60 mL/min (ref >60)
Glucose, Bld: 82 mg/dL (ref 70–99)
Potassium: 3.3 mmol/L — ABNORMAL LOW (ref 3.5–5.1)
Sodium: 140 mmol/L (ref 135–145)
Total Bilirubin: 1.1 mg/dL (ref 0.0–1.2)
Total Protein: 6.3 g/dL — ABNORMAL LOW (ref 6.5–8.1)

## 2023-06-27 LAB — LIPASE, BLOOD: Lipase: 28 U/L (ref 11–51)

## 2023-06-27 MED ORDER — IOHEXOL 300 MG/ML  SOLN
100.0000 mL | Freq: Once | INTRAMUSCULAR | Status: AC | PRN
  Administered 2023-06-27: 100 mL via INTRAVENOUS

## 2023-06-27 MED ORDER — LANSOPRAZOLE 15 MG PO CPDR: 15.0000 mg | DELAYED_RELEASE_CAPSULE | Freq: Every day | ORAL | 0 refills | Status: DC

## 2023-06-27 NOTE — ED Notes (Signed)
 Pt is CAOx4, breathing normally, and normal in color. Pt states that her doctors told her to come here to be evaluated for possible gastrointestinal bleeding due to vomiting contents that appeared to be black and flaky. PT states that she has a hernia that may be the cause of her emesis. PT in bed at this time and in NAD. Pt denies any needs.

## 2023-06-27 NOTE — Discharge Instructions (Signed)
 Please return to the emergency department if your symptoms return, change, or worsen.  Follow-up with your GI specialist when possible.  Take the Prevacid daily as prescribed.

## 2023-06-27 NOTE — Patient Instructions (Signed)
 Please go to the ER due to vomiting blood, we will scheduled you for July 3rd for surgery   You have requested to have a Ventral Hernia Repair. This will be done by Dr Dana Duncan at [facility]. Please see your (BLUE) Pre-care sheet for more information. Our surgery scheduler will call you to look at surgery dates and to go over surgery information.   If you are on any injectable weight loss medication, you will need to stop taking your GLP-1 injectable (weight loss) medications 8 days before your surgery to avoid any complications with anesthesia.   You will need to arrange to be out of work for approximately 1-2 weeks and then you may return with a lifting restriction for 4 more weeks. If you have FMLA or Disability paperwork that needs to be filled out, please have your company fax your paperwork to [phone] or you may drop this by either office. This paperwork will be filled out within 3 days after your surgery has been completed.     Ventral Hernia A ventral hernia (also called an incisional hernia) is a hernia that occurs at the site of a previous surgical cut (incision) in the abdomen. The abdominal wall spans from your lower chest down to your pelvis. If the abdominal wall is weakened from a surgical incision, a hernia can occur. A hernia is a bulge of bowel or muscle tissue pushing out on the weakened part of the abdominal wall. Ventral hernias can get bigger from straining or lifting. Obese and older people are at higher risk for a ventral hernia. People who develop infections after surgery or require repeat incisions at the same site on the abdomen are also at increased risk. CAUSES  A ventral hernia occurs because of weakness in the abdominal wall at an incision site.  SYMPTOMS  Common symptoms include: A visible bulge or lump on the abdominal wall. Pain or tenderness around the lump. Increased discomfort if you cough or make a sudden movement. If the hernia has blocked part of the  intestine, a serious complication can occur (incarcerated or strangulated hernia). This can become a problem that requires emergency surgery because the blood flow to the blocked intestine may be cut off. Symptoms may include: Feeling sick to your stomach (nauseous). Throwing up (vomiting). Stomach swelling (distention) or bloating. Fever. Rapid heartbeat. DIAGNOSIS  Your health care provider will take a medical history and perform a physical exam. Various tests may be ordered, such as: Blood tests. Urine tests. Ultrasonography. X-rays. Computed tomography (CT). TREATMENT  Watchful waiting may be all that is needed for a smaller hernia that does not cause symptoms. Your health care provider may recommend the use of a supportive belt (truss) that helps to keep the abdominal wall intact. For larger hernias or those that cause pain, surgery to repair the hernia is usually recommended. If a hernia becomes strangulated, emergency surgery needs to be done right away. HOME CARE INSTRUCTIONS Avoid putting pressure or strain on the abdominal area. Avoid heavy lifting. Use good body positioning for physical tasks. Ask your health care provider about proper body positioning. Use a supportive belt as directed by your health care provider. Maintain a healthy weight. Eat foods that are high in fiber, such as whole grains, fruits, and vegetables. Fiber helps prevent difficult bowel movements (constipation). Drink enough fluids to keep your urine clear or pale yellow. Follow up with your health care provider as directed. SEEK MEDICAL CARE IF:  Your hernia seems to be getting larger  or more painful. SEEK IMMEDIATE MEDICAL CARE IF:  You have abdominal pain that is sudden and sharp. Your pain becomes severe. You have repeated vomiting. You are sweating a lot. You notice a rapid heartbeat. You develop a fever. MAKE SURE YOU:  Understand these instructions. Will watch your condition. Will get help  right away if you are not doing well or get worse.     Open Ventral Hernia Repair Open ventral hernia repair is a surgery to fix a ventral hernia. A ventral hernia,  is a bulge of body tissue or intestines that pushes through the front part of the abdomen. This can happen if the connective tissue covering the muscles over the abdomen has a weak spot or is torn because of a surgical cut (incision) from a previous surgery. A ventral hernia repair is often done soon after diagnosis to stop the hernia from getting bigger, becoming uncomfortable, or becoming an emergency. This surgery usually takes about 2 hours, but the time can vary greatly.  LET [facility] CARE PROVIDER KNOW ABOUT: Any allergies you have. All medicines you are taking, including steroids, vitamins, herbs, eye drops, creams, and over-the-counter medicines. Previous problems you or members of your family have had with the use of anesthetics. Any blood disorders you have. Previous surgeries you have had. Medical conditions you have.  RISKS AND COMPLICATIONS  Generally, Open ventral hernia repair is a safe procedure. However, as with any surgical procedure, problems can occur. Possible problems include: Bleeding. Trouble passing urine or having a bowel movement after the surgery. Infection. Pneumonia. Blood clots. Pain in the area of the hernia. A bulge in the area of the hernia that may be caused by a collection of fluid. Injury to intestines or other structures in the abdomen. Return of the hernia after surgery.  BEFORE THE PROCEDURE  You may need to have blood tests, urine tests, a chest X-ray, or an electrocardiogram done before the day of the surgery. Ask your health care provider about changing or stopping your regular medicines. This is especially important if you are taking diabetes medicines or blood thinners. You may need to wash with a special type of germ-killing soap. Do not eat or drink anything after midnight  the night before the procedure or as directed by your health care provider. Make plans to have someone drive you home after the procedure.  PROCEDURE  Small monitors will be put on your body. They are used to check your heart, blood pressure, and oxygen level. An IV access tube will be put into a vein in your hand or arm. Fluids and medicine will flow directly into your body through the IV tube. You will be given medicine that makes you go to sleep (general anesthetic). Your abdomen will be cleaned with a special soap to kill any germs on your skin. Once you are asleep, a moderate - large size incision will be made in your abdomen. The size of incision depends on how large your hernia is. Your surgeon puts the tissue or intestines that formed the hernia back in place. A screen-like patch (mesh) is used to close the hernia. This helps make the area stronger. Stitches, tacks, or staples are used to keep the mesh in place. Medicine and a bandage (dressing) or skin glue will be put over the incision.  AFTER THE PROCEDURE  You will stay in a recovery area until the anesthetic wears off. Your blood pressure and pulse will be checked often. You may be able  to go home the same day or may need to stay in the hospital for 1-2 days after surgery. Your surgeon will decide when you can go home depending upon your recovery. You may feel some pain. You will be given medicine for pain. You will be urged to do breathing exercises that involve taking deep breaths. This helps prevent a lung infection after a surgery. You may have to wear compression stockings while you are in the hospital. These stockings help keep blood clots from forming in your legs.   This information is not intended to replace advice given to you by your health care provider. Make sure you discuss any questions you have with your health care provider.   Document Released: 12/15/2011 Document Revised: 01/02/2013 Document Reviewed:  12/15/2011 Elsevier Interactive Patient Education [employer]

## 2023-06-27 NOTE — Telephone Encounter (Signed)
 Patient has been advised of Pre-Admission date/time, and Surgery date at [facility].  Surgery Date: 07/14/23 Preadmission Testing Date: 07/06/23 (phone 8a-1p)  Patient informed of the scheduling process and surgery information given at time of office visit.   Patient has been made aware to call [phone], between 1-3:00pm the day before surgery, to find out what time to arrive for surgery.

## 2023-06-27 NOTE — ED Triage Notes (Signed)
 Pt comes in via pov with complaints of vomiting for the past few weeks. Pt states that her last episode of vomiting was yesterday morning ans she noticed it was black. Pt with no vomiting today, and was told by her GI doctor and primary care doctors to come be seen.  Pt with no complaints of abdominal pain.

## 2023-06-27 NOTE — ED Notes (Signed)
 I called CT to see how much longer it will be on pt's CT. I was informed that a urine preg was needed first. Pt stated that she has had her tubes tied and is not sexually active. Pt agreed to sign a waiver for CT refusing pregnancy test.

## 2023-06-27 NOTE — ED Provider Notes (Signed)
 [facility] Provider Note    Event Date/Time   First MD Initiated Contact with Patient 06/27/23 1309     (approximate)   History   Emesis   HPI  Karen Hill is a 48 y.o. female with history of hypothyroidism,lymphedema and as listed in EMR presents to the emergency department for treatment and evaluation of vomiting for the past few weeks.  Last episode of vomiting was yesterday morning. The vomitus was black and flaky. She has not vomited today. She is on Wegovy  and doesn't have a good appetite at baseline.  This is unchanged.  She denies constipation or diarrhea and denies bloody or black stools. She is scheduled for hernia repair in July. She had an appointment with Dr. Dana Duncan today and he advised her to come to the ER to make sure there is no incarceration.       Physical Exam   Triage Vital Signs: ED Triage Vitals  Encounter Vitals Group     BP 06/27/23 1243 109/82     Girls Systolic BP Percentile --      Girls Diastolic BP Percentile --      Boys Systolic BP Percentile --      Boys Diastolic BP Percentile --      Pulse Rate 06/27/23 1243 97     Resp 06/27/23 1243 17     Temp 06/27/23 1243 98.8 F (37.1 C)     Temp src --      SpO2 06/27/23 1243 94 %     Weight 06/27/23 1244 219 lb (99.3 kg)     Height 06/27/23 1244 5' 6 (1.676 m)     Head Circumference --      Peak Flow --      Pain Score 06/27/23 1244 0     Pain Loc --      Pain Education --      Exclude from Growth Chart --     Most recent vital signs: Vitals:   06/27/23 1243 06/27/23 1530  BP: 109/82 (!) 118/53  Pulse: 97 80  Resp: 17 18  Temp: 98.8 F (37.1 C)   SpO2: 94% 96%    General: Awake, no distress.  CV:  Good peripheral perfusion.  Resp:  Normal effort.  Abd:  No distention. Ventral hernia is soft, normal skin color, no tenderness Other:     ED Results / Procedures / Treatments   Labs (all labs ordered are listed, but only abnormal results are  displayed) Labs Reviewed  COMPREHENSIVE METABOLIC PANEL WITH GFR - Abnormal; Notable for the following components:      Result Value   Potassium 3.3 (*)    BUN <5 (*)    Calcium  8.8 (*)    Total Protein 6.3 (*)    Albumin 3.2 (*)    All other components within normal limits  URINALYSIS, ROUTINE W REFLEX MICROSCOPIC - Abnormal; Notable for the following components:   Color, Urine YELLOW (*)    APPearance HAZY (*)    Hgb urine dipstick SMALL (*)    Leukocytes,Ua SMALL (*)    Bacteria, UA MANY (*)    All other components within normal limits  LIPASE, BLOOD  CBC  POC URINE PREG, ED  TYPE AND SCREEN     EKG  Not indicated.   RADIOLOGY  Image and radiology report reviewed and interpreted by me. Radiology report consistent with the same.  CT again shows a large ventral hernia but without obstruction or  incarceration.  PROCEDURES:  Critical Care performed: No  Procedures   MEDICATIONS ORDERED IN ED:  Medications  iohexol  (OMNIPAQUE ) 300 MG/ML solution 100 mL (100 mLs Intravenous Contrast Given 06/27/23 1612)     IMPRESSION / MDM / ASSESSMENT AND PLAN / ED COURSE   I have reviewed the triage note.  Differential diagnosis includes, but is not limited to, upper/lower GI bleed; ventral hernia incarceration  Patient's presentation is most consistent with acute illness / injury with system symptoms.  48 year old female presents to the emergency department for treatment and evaluation after an episode of dark vomit yesterday. She denies abdominal pain, nausea or vomiting today. No change in size of hernia.   Vital signs are stable.  Labs show a normal CBC including hemoglobin and hematocrit.  CMP shows a very mild hypokalemia at 3.3 but is otherwise unremarkable.  Urinalysis shows a small amount of hemoglobin, small amount of leukocytes and many bacteria with some contamination.  Patient denies urinary symptoms.  CT exam is reassuring and does not show any indication of  bowel incarceration or obstruction.  Patient has not had any further episodes of vomiting while here.  Plan will be to discharge her home with a prescription for Prevacid and instructions to follow-up with her GI specialist as well as Dr. Dana Duncan for her hernia repair.      FINAL CLINICAL IMPRESSION(S) / ED DIAGNOSES   Final diagnoses:  Nausea and vomiting, unspecified vomiting type     Rx / DC Orders   ED Discharge Orders          Ordered    lansoprazole (PREVACID) 15 MG capsule  Daily        06/27/23 1729             Note:  This document was prepared using Dragon voice recognition software and may include unintentional dictation errors.   Sherryle Don, FNP 06/27/23 1735    Bryson Carbine, MD 07/01/23 [phone]

## 2023-06-28 LAB — TYPE AND SCREEN
ABO/RH(D): A NEG
Antibody Screen: NEGATIVE

## 2023-06-29 ENCOUNTER — Encounter: Payer: Self-pay | Admitting: Surgery

## 2023-06-29 NOTE — H&P (View-Only) (Signed)
 Outpatient Surgical Follow Up    Karen Hill is an 48 y.o. female.   Chief Complaint  Patient presents with   Follow-up    Ventral Hernia     HPI: Karen Hill is a 48 y.o. female seen in follow up for symptomatic and worsening ventral hernia.  She did have a history of cholecystectomy as well as C-section in the past and tubal ligation.  She thinks that this might be a recurrent.  she has been having more abdominal pain that is moderate intermittent and worsening with certain meals.  The hernia has progressed to the point that now is not reducible.  She does have daily symptoms.  She continues to smoke but is cutting back.  He does have some chronic pulmonary issues.  SHe did have a recent CT scan that have personally reviewed showing evidence of a large ventral hernia with some loss of domain and chronically incarcerated bowel\ She has been compliant with everything that I have asked her to do, including involving her family in the process and losing weight.  She continues to loose weight and now she is 89, this is the best weight she has been in a while.  More rcently developed nausea and some diarrhea, states is dark and concerning for potential GI bleed     She does have some chronic pain issues and will see pain management team in the next few weeks. 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  Past Medical History:  Diagnosis Date   Liver hemangioma    Lymphedema    Thyroid disease     Past Surgical History:  Procedure Laterality Date   CESAREAN SECTION  2014   CHOLECYSTECTOMY  2005   COLONOSCOPY WITH PROPOFOL  N/A 03/09/2023   Procedure: COLONOSCOPY WITH PROPOFOL ;  Surgeon: Luke Salaam, MD;  Location: [facility] ENDOSCOPY;  Service: Gastroenterology;  Laterality: N/A;   MANDIBLE FRACTURE SURGERY     MULTIPLE TOOTH EXTRACTIONS     TUBAL LIGATION  2014    Family History  Problem Relation Age of Onset   Diabetes Mother    Hypertension Father    Breast  cancer Neg Hx     Social History:  reports that she has been smoking cigarettes. She has a 10 pack-year smoking history. She has been exposed to tobacco smoke. She has never used smokeless tobacco. She reports current alcohol use. She reports that she does not use drugs.  Allergies:  Allergies  Allergen Reactions   Penicillin G Hives and Itching   Amoxicillin    Shellfish Allergy Nausea And Vomiting    Medications reviewed.    ROS Full ROS performed and is otherwise negative other than what is stated in HPI   BP 110/60   Pulse 100   Temp 97.9 F (36.6 C) (Oral)   Ht 5' 6 (1.676 m)   Wt 219 lb (99.3 kg)   SpO2 95%   BMI 35.35 kg/m   Physical Exam  Physical Exam CONSTITUTIONAL: NAD BMI 35. EYES: Pupils are equal, round, Sclera are non-icteric. EARS, NOSE, MOUTH AND THROAT: The oropharynx is clear. The oral mucosa is pink and moist. Hearing is intact to voice. LYMPH NODES:  Lymph nodes in the neck are normal. RESPIRATORY:  Lungs are clear. There is normal respiratory effort, with equal breath sounds bilaterally, and without pathologic use of accessory muscles. CARDIOVASCULAR: Heart is regular without murmurs, gallops, or rubs. GI: The abdomen is  soft, large chronically incarcerated ventral hernia  between umbilicus and xiphoid. Defect larger 10 cms, Tender to palpation. No peritonitis, there is loss of domain. There are no palpable masses. There is no hepatosplenomegaly. There are normal bowel sounds GU: Rectal deferred.   MUSCULOSKELETAL: Normal muscle strength and tone. No cyanosis or edema.   SKIN: Turgor is good and there are no pathologic skin lesions or ulcers. NEUROLOGIC: Motor and sensation is grossly normal. Cranial nerves are grossly intact. PSYCH:  Oriented to person, place and time. Affect is normal.     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  She does have multiple risk factor  including a  high BMI now improved and at 35 as well as active smoker.  I had an extensive discussion with the patient  about her disease process.  Unfortunately her symptoms are crescendo to the point that her hernia is currently not reducible. Currently there is no signs of strangulation.I Do think that she is between a rock and a hard place and we will need to perform her surgery before she develops a catastrophic complication..   I did have a very frank discussion with the pt .  In my experience these are very difficult cases because we are unable to completely optimize theses pts; and that usually they present with a strangulation that significantly compromises the abdominal wall and also increase tremendously the duration of complications. I think we are ready for potential intervention and we will tentatively do it next month She is certainly at increased risk of developing perioperative complication including recurrence and infection.   We could potentially perform a robotic repair component separation release.  Sometimes these are very challenging cases that may require open intervention. With that being said I do want her to go to the ER to r/o any potential GI bleed,. I suspect she does have nausea related to semiglutide injection, hernia might also be a contributing factor. Extensive counseling provided, she is well aware of her situation   I personally spent a total of 40 minutes in the care of the patient today including performing a medically appropriate exam/evaluation, counseling and educating, placing orders, referring and communicating with other health care professionals, documenting clinical information in the EHR, independently interpreting and reviewing images studies and coordinating care.   Evelia Hipp, MD [facility] General Surgeon

## 2023-06-29 NOTE — Progress Notes (Signed)
 Outpatient Surgical Follow Up    Karen Hill is an 48 y.o. female.   Chief Complaint  Patient presents with   Follow-up    Ventral Hernia     HPI: Karen Hill is a 48 y.o. female seen in follow up for symptomatic and worsening ventral hernia.  She did have a history of cholecystectomy as well as C-section in the past and tubal ligation.  She thinks that this might be a recurrent.  she has been having more abdominal pain that is moderate intermittent and worsening with certain meals.  The hernia has progressed to the point that now is not reducible.  She does have daily symptoms.  She continues to smoke but is cutting back.  He does have some chronic pulmonary issues.  SHe did have a recent CT scan that have personally reviewed showing evidence of a large ventral hernia with some loss of domain and chronically incarcerated bowel\ She has been compliant with everything that I have asked her to do, including involving her family in the process and losing weight.  She continues to loose weight and now she is 219, this is the best weight she has been in a while.  More rcently developed nausea and some diarrhea, states is dark and concerning for potential GI bleed     She does have some chronic pain issues and will see pain management team in the next few weeks. 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  Past Medical History:  Diagnosis Date   Liver hemangioma    Lymphedema    Thyroid disease     Past Surgical History:  Procedure Laterality Date   CESAREAN SECTION  2014   CHOLECYSTECTOMY  2005   COLONOSCOPY WITH PROPOFOL  N/A 03/09/2023   Procedure: COLONOSCOPY WITH PROPOFOL ;  Surgeon: Luke Salaam, MD;  Location: [facility] ENDOSCOPY;  Service: Gastroenterology;  Laterality: N/A;   MANDIBLE FRACTURE SURGERY     MULTIPLE TOOTH EXTRACTIONS     TUBAL LIGATION  2014    Family History  Problem Relation Age of Onset   Diabetes Mother    Hypertension Father    Breast  cancer Neg Hx     Social History:  reports that she has been smoking cigarettes. She has a 10 pack-year smoking history. She has been exposed to tobacco smoke. She has never used smokeless tobacco. She reports current alcohol use. She reports that she does not use drugs.  Allergies:  Allergies  Allergen Reactions   Penicillin G Hives and Itching   Amoxicillin    Shellfish Allergy Nausea And Vomiting    Medications reviewed.    ROS Full ROS performed and is otherwise negative other than what is stated in HPI   BP 110/60   Pulse 100   Temp 97.9 F (36.6 C) (Oral)   Ht 5' 6 (1.676 m)   Wt 219 lb (99.3 kg)   SpO2 95%   BMI 35.35 kg/m   Physical Exam  Physical Exam CONSTITUTIONAL: NAD BMI 35. EYES: Pupils are equal, round, Sclera are non-icteric. EARS, NOSE, MOUTH AND THROAT: The oropharynx is clear. The oral mucosa is pink and moist. Hearing is intact to voice. LYMPH NODES:  Lymph nodes in the neck are normal. RESPIRATORY:  Lungs are clear. There is normal respiratory effort, with equal breath sounds bilaterally, and without pathologic use of accessory muscles. CARDIOVASCULAR: Heart is regular without murmurs, gallops, or rubs. GI: The abdomen is  soft, large chronically incarcerated ventral hernia  between umbilicus and xiphoid. Defect larger 10 cms, Tender to palpation. No peritonitis, there is loss of domain. There are no palpable masses. There is no hepatosplenomegaly. There are normal bowel sounds GU: Rectal deferred.   MUSCULOSKELETAL: Normal muscle strength and tone. No cyanosis or edema.   SKIN: Turgor is good and there are no pathologic skin lesions or ulcers. NEUROLOGIC: Motor and sensation is grossly normal. Cranial nerves are grossly intact. PSYCH:  Oriented to person, place and time. Affect is normal.     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  She does have multiple risk factor  including a  high BMI now improved and at 35 as well as active smoker.  I had an extensive discussion with the patient  about her disease process.  Unfortunately her symptoms are crescendo to the point that her hernia is currently not reducible. Currently there is no signs of strangulation.I Do think that she is between a rock and a hard place and we will need to perform her surgery before she develops a catastrophic complication..   I did have a very frank discussion with the pt .  In my experience these are very difficult cases because we are unable to completely optimize theses pts; and that usually they present with a strangulation that significantly compromises the abdominal wall and also increase tremendously the duration of complications. I think we are ready for potential intervention and we will tentatively do it next month She is certainly at increased risk of developing perioperative complication including recurrence and infection.   We could potentially perform a robotic repair component separation release.  Sometimes these are very challenging cases that may require open intervention. With that being said I do want her to go to the ER to r/o any potential GI bleed,. I suspect she does have nausea related to semiglutide injection, hernia might also be a contributing factor. Extensive counseling provided, she is well aware of her situation   I personally spent a total of 40 minutes in the care of the patient today including performing a medically appropriate exam/evaluation, counseling and educating, placing orders, referring and communicating with other health care professionals, documenting clinical information in the EHR, independently interpreting and reviewing images studies and coordinating care.   Evelia Hipp, MD [facility] General Surgeon

## 2023-07-05 ENCOUNTER — Ambulatory Visit: Admitting: Physician Assistant

## 2023-07-06 ENCOUNTER — Encounter
Admission: RE | Admit: 2023-07-06 | Discharge: 2023-07-06 | Disposition: A | Discharge status: Home / Self Care | Source: Ambulatory Visit | Attending: Surgery | Admitting: Surgery

## 2023-07-06 ENCOUNTER — Other Ambulatory Visit: Payer: Self-pay

## 2023-07-06 DIAGNOSIS — Z01818 Encounter for other preprocedural examination: Secondary | ICD-10-CM

## 2023-07-06 HISTORY — DX: Dyspnea, unspecified: R06.00

## 2023-07-06 HISTORY — DX: Gastro-esophageal reflux disease without esophagitis: K21.9

## 2023-07-06 HISTORY — DX: Hypothyroidism, unspecified: E03.9

## 2023-07-06 HISTORY — DX: Sleep apnea, unspecified: G47.30

## 2023-07-06 NOTE — Patient Instructions (Signed)
 Your procedure is scheduled on:07-14-23 Thursday Report to the Registration Desk on the 1st floor of the Medical Mall.Then proceed to the 2nd floor Surgery Desk To find out your arrival time, please call [phone] between 1PM - 3PM on:07-13-23 Wednesday If your arrival time is 6:00 am, do not arrive before that time as the Medical Mall entrance doors do not open until 6:00 am.  REMEMBER: Instructions that are not followed completely may result in serious medical risk, up to and including death; or upon the discretion of your surgeon and anesthesiologist your surgery may need to be rescheduled.  Do not eat food after midnight the night before surgery.  No gum chewing or hard candies.  You may however, drink CLEAR liquids up to 2 hours before you are scheduled to arrive for your surgery. Do not drink anything within 2 hours of your scheduled arrival time.  Clear liquids include: - water  - apple juice without pulp - gatorade (not RED colors) - black coffee or tea (Do NOT add milk or creamers to the coffee or tea) Do NOT drink anything that is not on this list.  One week prior to surgery:Stop NOW (07-06-23) Stop Anti-inflammatories (NSAIDS) such as Advil, Aleve, Ibuprofen, Motrin, Naproxen, Naprosyn and Aspirin based products such as Excedrin, Goody's Powder, BC Powder. Stop ANY OVER THE COUNTER supplements until after surgery (Vitamin D3, Ferrous Sulfate)  You may however, continue to take Tylenol if needed for pain up until the day of surgery.  Stop Semaglutide -Weight Management (WEGOVY ) 7 days prior to surgery-Do NOT take again until AFTER surgery  Continue taking all of your other prescription medications up until the day of surgery.  ON THE DAY OF SURGERY ONLY TAKE THESE MEDICATIONS WITH SIPS OF WATER: -lansoprazole (PREVACID)  -levothyroxine  (SYNTHROID )  -rosuvastatin  (CRESTOR )   Bring your Albuterol  Inhaler to the hospital  No Alcohol for 24 hours before or after  surgery.  No Smoking including e-cigarettes for 24 hours before surgery.  No chewable tobacco products for at least 6 hours before surgery.  No nicotine  patches on the day of surgery.  Do not use any recreational drugs for at least a week (preferably 2 weeks) before your surgery.  Please be advised that the combination of cocaine and anesthesia may have negative outcomes, up to and including death. If you test positive for cocaine, your surgery will be cancelled.  On the morning of surgery brush your teeth with toothpaste and water, you may rinse your mouth with mouthwash if you wish. Do not swallow any toothpaste or mouthwash.  Use CHG Soap as directed on instruction sheet.  Do not wear jewelry, make-up, hairpins, clips or nail polish.  For welded (permanent) jewelry: bracelets, anklets, waist bands, etc.  Please have this removed prior to surgery.  If it is not removed, there is a chance that hospital personnel will need to cut it off on the day of surgery.  Do not wear lotions, powders, or perfumes.   Do not shave body hair from the neck down 48 hours before surgery.  Contact lenses, hearing aids and dentures may not be worn into surgery.  Do not bring valuables to the hospital. [facility] is not responsible for any missing/lost belongings or valuables.   Notify your doctor if there is any change in your medical condition (cold, fever, infection).  Wear comfortable clothing (specific to your surgery type) to the hospital.  After surgery, you can help prevent lung complications by doing breathing exercises.  Take  deep breaths and cough every 1-2 hours. Your doctor may order a device called an Incentive Spirometer to help you take deep breaths. When coughing or sneezing, hold a pillow firmly against your incision with both hands. This is called "splinting." Doing this helps protect your incision. It also decreases belly discomfort.  If you are being admitted to the hospital  overnight, leave your suitcase in the car. After surgery it may be brought to your room.  In case of increased patient census, it may be necessary for you, the patient, to continue your postoperative care in the Same Day Surgery department.  If you are being discharged the day of surgery, you will not be allowed to drive home. You will need a responsible individual to drive you home and stay with you for 24 hours after surgery.   If you are taking public transportation, you will need to have a responsible individual with you.  Please call the Pre-admissions Testing Dept. at [phone] if you have any questions about these instructions.  Surgery Visitation Policy:  Patients having surgery or a procedure may have two visitors.  Children under the age of 41 must have an adult with them who is not the patient.  Inpatient Visitation:    Visiting hours are 7 a.m. to 8 p.m. Up to four visitors are allowed at one time in a patient room. The visitors may rotate out with other people during the day.  One visitor age 43 or older may stay with the patient overnight and must be in the room by 8 p.m.   Merchandiser, retail to address health-related social needs:  [email or web address]      Preparing for Surgery with CHLORHEXIDINE GLUCONATE (CHG) Soap  Chlorhexidine Gluconate (CHG) Soap  o An antiseptic cleaner that kills germs and bonds with the skin to continue killing germs even after washing  o Used for showering the night before surgery and morning of surgery  Before surgery, you can play an important role by reducing the number of germs on your skin.  CHG (Chlorhexidine gluconate) soap is an antiseptic cleanser which kills germs and bonds with the skin to continue killing germs even after washing.  Please do not use if you have an allergy to CHG or antibacterial soaps. If your skin becomes reddened/irritated stop using the CHG.  1. Shower the NIGHT BEFORE  SURGERY and the MORNING OF SURGERY with CHG soap.  2. If you choose to wash your hair, wash your hair first as usual with your normal shampoo.  3. After shampooing, rinse your hair and body thoroughly to remove the shampoo.  4. Use CHG as you would any other liquid soap. You can apply CHG directly to the skin and wash gently with a scrungie or a clean washcloth.  5. Apply the CHG soap to your body only from the neck down. Do not use on open wounds or open sores. Avoid contact with your eyes, ears, mouth, and genitals (private parts). Wash face and genitals (private parts) with your normal soap.  6. Wash thoroughly, paying special attention to the area where your surgery will be performed.  7. Thoroughly rinse your body with warm water.  8. Do not shower/wash with your normal soap after using and rinsing off the CHG soap.  9. Pat yourself dry with a clean towel.  10. Wear clean pajamas to bed the night before surgery.  12. Place clean sheets on your bed the night of your first shower and do not  sleep with pets.  13. Shower again with the CHG soap on the day of surgery prior to arriving at the hospital.  14. Do not apply any deodorants/lotions/powders.  15. Please wear clean clothes to the hospital.

## 2023-07-07 NOTE — Progress Notes (Signed)
 Karen Console, PA-C [address] [place], [place]  [address] Phone: [phone]   Gastroenterology Consultation  Referring Provider:     Orlean Alan HERO, FNP Primary Care Physician:  Orlean Alan HERO, FNP Primary Gastroenterologist:  Karen Console, PA-C / Glendia Holt, MD  Reason for Consultation:     Nausea and vomiting        HPI:   ARRIELLE Hill is a 48 y.o. y/o female referred for consultation & management  by Orlean Alan HERO, FNP.    I last saw patient at [facility] 01/2023.  She has history of enlarging lower abdominal ventral hernia since 2013.   She was evaluated by general surgeon Dr. Jordis and recommended weight loss.  She is scheduled for ventral hernia repair surgery 07/14/2023 by Dr. Jordis.  Patient started Wegovy  October 2024.  Since then she has lost 72 pounds (from 292 to 220) intentionally.    Current symptoms: She went to [facility] ED 06/27/2023 to evaluate nausea and vomiting.  Had 1 episode of vomiting black.  CT scan showed large ventral hernia with no obstruction or incarceration.  Was told to follow-up with GI.  She was started on Prevacid 15 Mg daily.  Since then her nausea and vomiting have subsided.  Heartburn has improved on PPI.  No more vomiting since starting Prevacid.  She denies NSAID use.  She continues to have dark stools and is taking iron daily.  She denies Pepto-Bismol use.  She admits to chronic dizziness.  Mild mid lower abdominal pain.  No upper abdominal pain.  No previous EGD.  She has history of chronic constipation.  Is currently taking Amitiza  24 mcg twice daily with fair control of constipation.  She has bowel movement once or twice per week.  Sometimes she may go 2 or 3 weeks with no bowel movement.  06/27/2023 abdominal pelvic CT: 1. Stable large midline ventral hernia containing the proximal colon and mesenteric fat. No obstruction or incarceration. 2. No acute intra-abdominal or intrapelvic process. 3. Stable submucosal fatty  infiltration of the proximal colon suggestive of chronic inflammation. No acute inflammatory changes. 4. Stable borderline enlarged bilateral external iliac chain lymph nodes, nonspecific but likely reactive.  06/27/2023 labs: Normal CBC (Hgb 14.4, WBC 7.3).  Normal lipase.  Low potassium 3.3.  Low calcium , total protein, and albumin.  Normal glucose 82, BUN 5, creatinine 0.57.  Normal LFTs.  03/09/2023 colonoscopy by Dr. Therisa: Fair prep.  Otherwise normal.  Could not reach the cecum due to herniated colon and loops through abdominal wall.  No polyps.  No biopsies.  Virtual colonoscopy was recommended due to incomplete colonoscopy.  Consider repeat colonoscopy after repair of the hernia.  Virtual CT colonoscopy was ordered, yet not completed.   Past Medical History:  Diagnosis Date   Dyspnea    GERD (gastroesophageal reflux disease)    Hypothyroidism    Liver hemangioma    Lymphedema    Sleep apnea    no cpap   Thyroid disease     Past Surgical History:  Procedure Laterality Date   CESAREAN SECTION  2014   CHOLECYSTECTOMY  2005   COLONOSCOPY WITH PROPOFOL  N/A 03/09/2023   Procedure: COLONOSCOPY WITH PROPOFOL ;  Surgeon: Therisa Bi, MD;  Location: [facility] ENDOSCOPY;  Service: Gastroenterology;  Laterality: N/A;   MANDIBLE FRACTURE SURGERY     MULTIPLE TOOTH EXTRACTIONS     TUBAL LIGATION  2014    Prior to Admission medications   Medication  Sig Start Date End Date Taking? Authorizing Provider  albuterol  (PROAIR  HFA) 108 (90 Base) MCG/ACT inhaler Inhale 2 puffs into the lungs every 6 (six) hours as needed for wheezing or shortness of breath. 05/03/23   Orlean Alan HERO, FNP  baclofen  (LIORESAL ) 10 MG tablet TAKE 1 TABLET BY MOUTH TWICE A DAY AS NEEDED FOR MUSCLE SPASMS Patient taking differently: Take 20 mg by mouth at bedtime. 03/25/23   Orlean Alan HERO, FNP  Cholecalciferol (VITAMIN D3) 50 MCG (2000 UT) TABS Take 2,000 Units by mouth in the morning.    [provider]   cyanocobalamin  (VITAMIN B12) 1000 MCG/ML injection INJECT 1 ML (1,000 MCG TOTAL) INTO THE MUSCLE EVERY 14 (FOURTEEN) DAYS. 06/27/23 09/19/23  Orlean Alan HERO, FNP  ferrous sulfate 325 (65 FE) MG tablet Take 325 mg by mouth in the morning.    [provider]  gabapentin (NEURONTIN) 300 MG capsule TAKE 1 CAPSULE BY MOUTH NIGHTLY AT BEDTIME FOR 7 DAYS, INCREASE TO 2 CAPS AT BEDTIME FOR NEUROPATHY, APPT TIME AND LABS PLEASE Patient taking differently: Take 600 mg by mouth at bedtime. 05/31/22   Fernand Fredy RAMAN, MD  lansoprazole (PREVACID) 15 MG capsule Take 1 capsule (15 mg total) by mouth daily at 12 noon. Patient taking differently: Take 15 mg by mouth every morning. 06/27/23   Triplett, Kirk B, FNP  levothyroxine  (SYNTHROID ) 125 MCG tablet TAKE 1 TABLET BY MOUTH DAILY BEFORE BREAKFAST. 04/12/23   Orlean Alan HERO, FNP  lubiprostone  (AMITIZA ) 24 MCG capsule Take 24 mcg by mouth 2 (two) times daily with a meal.    [provider]  nystatin  (MYCOSTATIN ) 100000 UNIT/ML suspension Take 5 mLs (500,000 Units total) by mouth 4 (four) times daily. 06/07/23   Orlean Alan HERO, FNP  rosuvastatin  (CRESTOR ) 5 MG tablet Take 1 tablet (5 mg total) by mouth daily. Patient taking differently: Take 5 mg by mouth every morning. 06/02/23 08/31/23  Orlean Alan HERO, FNP  Semaglutide -Weight Management (WEGOVY ) 1 MG/0.5ML SOAJ INJECT 1MG  INTO THE SKIN ONCE A WEEK Patient taking differently: Inject 1 mg into the skin every Saturday. 05/30/23   Orlean Alan HERO, FNP  Syringe/Needle, Disp, (SYRINGE 3CC/20GX1) 20G X 1 3 ML MISC 1 each by Does not apply route every 14 (fourteen) days. For use with B12 injections 05/09/23   Orlean Alan HERO, FNP    Family History  Problem Relation Age of Onset   Diabetes Mother    Hypertension Father    Breast cancer Neg Hx      Social History   Tobacco Use   Smoking status: Every Day    Current packs/day: 1.00    Average packs/day: 1 pack/day for 10.0 years (10.0 ttl  pk-yrs)    Types: Cigarettes    Passive exposure: Past   Smokeless tobacco: Never  Vaping Use   Vaping status: Former  Substance Use Topics   Alcohol use: Yes    Comment: rare   Drug use: No    Allergies as of 07/08/2023 - Review Complete 07/08/2023  Allergen Reaction Noted   Penicillin g Hives and Itching 11/08/2013   Amoxicillin  04/01/2022   Shellfish allergy Nausea And Vomiting 03/12/2016    Review of Systems:    All systems reviewed and negative except where noted in HPI.   Physical Exam:  BP 112/72   Pulse 96   Temp 98 F (36.7 C) (Oral)   Ht 5' 6 (1.676 m)   Wt 220 lb 6 oz (100 kg)  LMP 04/26/2023   BMI 35.57 kg/m  Patient's last menstrual period was 04/26/2023.  General:   Alert,  Well-developed, well-nourished, pleasant and cooperative in NAD Lungs:  Respirations even and unlabored.  Clear throughout to auscultation.   No wheezes, crackles, or rhonchi. No acute distress. Heart:  Regular rate and rhythm; no murmurs, clicks, rubs, or gallops. Abdomen:  Normal bowel sounds.  No bruits.  Soft, and obese; large ventral hernia present above the umbilicus which is reducible and tender.  No lower abdominal tenderness.    No guarding or rebound tenderness.    Neurologic:  Alert and oriented x3;  grossly normal neurologically. Psych:  Alert and cooperative.  Depressed mood and affect.  Imaging Studies: CT ABDOMEN PELVIS W CONTRAST Result Date: 06/27/2023 CLINICAL DATA:  Acute abdominal pain EXAM: CT ABDOMEN AND PELVIS WITH CONTRAST TECHNIQUE: Multidetector CT imaging of the abdomen and pelvis was performed using the standard protocol following bolus administration of intravenous contrast. RADIATION DOSE REDUCTION: This exam was performed according to the departmental dose-optimization program which includes automated exposure control, adjustment of the mA and/or kV according to patient size and/or use of iterative reconstruction technique. CONTRAST:  OMNIPAQUE   IOHEXOL  300 MG/ML  SOLN COMPARISON:  02/07/2023 FINDINGS: Lower chest: No acute pleural or parenchymal lung disease. Hepatobiliary: Stable benign 3.1 cm hemangioma posterior right lobe liver. No other focal liver abnormalities. Prior cholecystectomy. No biliary duct dilation. Pancreas: Unremarkable. No pancreatic ductal dilatation or surrounding inflammatory changes. Spleen: Normal in size without focal abnormality. Adrenals/Urinary Tract: Adrenal glands are unremarkable. Kidneys are normal, without renal calculi, focal lesion, or hydronephrosis. Bladder is unremarkable. Stomach/Bowel: No bowel obstruction or ileus. Normal appendix central upper abdomen. No bowel wall thickening or inflammatory change. Continued submucosal fatty infiltration of the proximal colon may suggest sequela of chronic inflammation. Vascular/Lymphatic: No significant vascular findings are present. Stable borderline enlarged bilateral external iliac chain lymph nodes, nonspecific. Reproductive: Uterus and bilateral adnexa are unremarkable. Other: No free fluid or free intraperitoneal gas. There is a large midline ventral hernia containing mesenteric fat and the majority of the ascending and proximal transverse colon. No bowel obstruction or incarceration. Musculoskeletal: No acute or destructive bony abnormalities. Reconstructed images demonstrate no additional findings. IMPRESSION: 1. Stable large midline ventral hernia containing the proximal colon and mesenteric fat. No obstruction or incarceration. 2. No acute intra-abdominal or intrapelvic process. 3. Stable submucosal fatty infiltration of the proximal colon suggestive of chronic inflammation. No acute inflammatory changes. 4. Stable borderline enlarged bilateral external iliac chain lymph nodes, nonspecific but likely reactive. Electronically Signed   By: Ozell Daring M.D.   On: 06/27/2023 17:05    Labs: CBC    Component Value Date/Time   WBC 7.3 06/27/2023 1249   RBC 4.61  06/27/2023 1249   HGB 14.4 06/27/2023 1249   HGB 15.2 06/01/2023 1041   HCT 43.8 06/27/2023 1249   HCT 45.0 06/01/2023 1041   PLT 216 06/27/2023 1249   PLT 221 06/01/2023 1041   MCV 95.0 06/27/2023 1249   MCV 93 06/01/2023 1041   MCH 31.2 06/27/2023 1249   MCHC 32.9 06/27/2023 1249   RDW 13.3 06/27/2023 1249   RDW 12.4 06/01/2023 1041   LYMPHSABS 2.2 06/01/2023 1041   EOSABS 0.1 06/01/2023 1041   BASOSABS 0.0 06/01/2023 1041    CMP     Component Value Date/Time   NA 140 06/27/2023 1249   NA 140 06/01/2023 1041   K 3.3 (L) 06/27/2023 1249   CL 106 06/27/2023  1249   CO2 24 06/27/2023 1249   GLUCOSE 82 06/27/2023 1249   BUN <5 (L) 06/27/2023 1249   BUN 6 06/01/2023 1041   CREATININE 0.57 06/27/2023 1249   CALCIUM  8.8 (L) 06/27/2023 1249   PROT 6.3 (L) 06/27/2023 1249   PROT 6.6 06/01/2023 1041   ALBUMIN 3.2 (L) 06/27/2023 1249   ALBUMIN 3.8 (L) 06/01/2023 1041   AST 20 06/27/2023 1249   ALT 11 06/27/2023 1249   ALKPHOS 69 06/27/2023 1249   BILITOT 1.1 06/27/2023 1249   BILITOT 1.1 06/01/2023 1041   GFRNONAA >60 06/27/2023 1249    Assessment and Plan:   AVANNAH DECKER is a 48 y.o. y/o female has been referred for   1.  Nausea and vomiting.  Multifactorial.  Suspect adverse side effect of Wegovy .  Also possible adverse side effect of Amitiza .  Also suspect GERD and gastritis.  Symptoms have currently improved since starting Prevacid.  Recent abdominal pelvic CT showed no bowel obstruction or other acute abnormality. - Continue Prevacid 30 Mg daily. - May need to decrease or hold Wegovy . - Stop Amitiza  and switch to Linzess. - Consider EGD if symptoms worsen or persist.  2.  Large ventral hernia - Continue with surgery as planned by Dr. Jordis 07/14/2023.  3.  GERD/gastritis - Increase Prevacid to 30 mg once daily. - Avoid NSAIDs. - If symptoms worsen, EGD as neck step.  4.  Chronic constipation -not controlled on Amitiza . - Stop Amitiza . - Start Linzess 145  mcg 1 capsule daily, #30, 5 refills.  5.  Dark stools: Possibly secondary to taking iron - Lab: CBC - Hemoccult cards  6.  Hypokalemia - Lab: BMP   Follow up with TG in 6 weeks.  This will give her time to recover from ventral hernia surgery.  Karen Console, PA-C

## 2023-07-08 ENCOUNTER — Ambulatory Visit (INDEPENDENT_AMBULATORY_CARE_PROVIDER_SITE_OTHER): Admitting: Physician Assistant

## 2023-07-08 ENCOUNTER — Ambulatory Visit: Payer: Self-pay | Admitting: Physician Assistant

## 2023-07-08 ENCOUNTER — Other Ambulatory Visit (INDEPENDENT_AMBULATORY_CARE_PROVIDER_SITE_OTHER)

## 2023-07-08 ENCOUNTER — Encounter: Payer: Self-pay | Admitting: Physician Assistant

## 2023-07-08 VITALS — BP 112/72 | HR 96 | Temp 98.0°F | Ht 66.0 in | Wt 220.4 lb

## 2023-07-08 DIAGNOSIS — E876 Hypokalemia: Secondary | ICD-10-CM

## 2023-07-08 DIAGNOSIS — K5909 Other constipation: Secondary | ICD-10-CM

## 2023-07-08 DIAGNOSIS — R112 Nausea with vomiting, unspecified: Secondary | ICD-10-CM | POA: Diagnosis not present

## 2023-07-08 DIAGNOSIS — K5904 Chronic idiopathic constipation: Secondary | ICD-10-CM

## 2023-07-08 DIAGNOSIS — K439 Ventral hernia without obstruction or gangrene: Secondary | ICD-10-CM | POA: Diagnosis not present

## 2023-07-08 DIAGNOSIS — R195 Other fecal abnormalities: Secondary | ICD-10-CM | POA: Diagnosis not present

## 2023-07-08 DIAGNOSIS — K2970 Gastritis, unspecified, without bleeding: Secondary | ICD-10-CM | POA: Diagnosis not present

## 2023-07-08 DIAGNOSIS — K219 Gastro-esophageal reflux disease without esophagitis: Secondary | ICD-10-CM | POA: Diagnosis not present

## 2023-07-08 LAB — CBC WITH DIFFERENTIAL/PLATELET
Basophils Absolute: 0.1 10*3/uL (ref 0.0–0.1)
Basophils Relative: 0.8 % (ref 0.0–3.0)
Eosinophils Absolute: 0.1 10*3/uL (ref 0.0–0.7)
Eosinophils Relative: 1.3 % (ref 0.0–5.0)
HCT: 44.7 % (ref 36.0–46.0)
Hemoglobin: 14.6 g/dL (ref 12.0–15.0)
Lymphocytes Relative: 28.9 % (ref 12.0–46.0)
Lymphs Abs: 1.9 10*3/uL (ref 0.7–4.0)
MCHC: 32.6 g/dL (ref 30.0–36.0)
MCV: 93.5 fl (ref 78.0–100.0)
Monocytes Absolute: 0.3 10*3/uL (ref 0.1–1.0)
Monocytes Relative: 4 % (ref 3.0–12.0)
Neutro Abs: 4.3 10*3/uL (ref 1.4–7.7)
Neutrophils Relative %: 65 % (ref 43.0–77.0)
Platelets: 204 10*3/uL (ref 150.0–400.0)
RBC: 4.78 Mil/uL (ref 3.87–5.11)
RDW: 14.1 % (ref 11.5–15.5)
WBC: 6.6 10*3/uL (ref 4.0–10.5)

## 2023-07-08 LAB — BASIC METABOLIC PANEL WITH GFR
BUN: 6 mg/dL (ref 6–23)
CO2: 33 meq/L — ABNORMAL HIGH (ref 19–32)
Calcium: 9.1 mg/dL (ref 8.4–10.5)
Chloride: 106 meq/L (ref 96–112)
Creatinine, Ser: 0.68 mg/dL (ref 0.40–1.20)
GFR: 104.54 mL/min (ref >60.00)
Glucose, Bld: 84 mg/dL (ref 70–99)
Potassium: 3.8 meq/L (ref 3.5–5.1)
Sodium: 143 meq/L (ref 135–145)

## 2023-07-08 MED ORDER — LANSOPRAZOLE 30 MG PO CPDR: 30.0000 mg | DELAYED_RELEASE_CAPSULE | Freq: Every day | ORAL | 3 refills | Status: AC

## 2023-07-08 MED ORDER — LINACLOTIDE 145 MCG PO CAPS
145.0000 ug | ORAL_CAPSULE | Freq: Every day | ORAL | 5 refills | Status: DC
Start: 2023-07-08 — End: 2023-08-19

## 2023-07-08 NOTE — Patient Instructions (Addendum)
 Your provider has requested that you go to the basement level for lab work before leaving today. Press B on the elevator. The lab is located at the first door on the left as you exit the elevator.  We have sent the following medications to your pharmacy for you to pick up at your convenience:Linzess 145 mcg daily before breaakfast and Prevacid 30 mg daily  Stop: Lubiprostone  (Amitiza )  For Constipation Try: Samples of Linzess 72 mcg daily for 1 week,  then 145 mcg daily for 1 week,     Follow the instructions on the Hemoccult cards and mail them back to us  when you are finished or you may take them directly to the lab in the basement of the [facility] building. We will call you with the results.   Please follow up sooner if symptoms increase or worsen  Due to recent changes in healthcare laws, you may see the results of your imaging and laboratory studies on MyChart before your provider has had a chance to review them.  We understand that in some cases there may be results that are confusing or concerning to you. Not all laboratory results come back in the same time frame and the provider may be waiting for multiple results in order to interpret others.  Please give us  48 hours in order for your provider to thoroughly review all the results before contacting the office for clarification of your results.   Thank you for trusting me with your gastrointestinal care!   Ellouise Console, PA-C _______________________________________________________  If your blood pressure at your visit was 140/90 or greater, please contact your primary care physician to follow up on this.  _______________________________________________________  If you are age 65 or older, your body mass index should be between 23-30. Your Body mass index is 35.57 kg/m. If this is out of the aforementioned range listed, please consider follow up with your Primary Care Provider.  If you are age 80 or younger, your body mass index should  be between 19-25. Your Body mass index is 35.57 kg/m. If this is out of the aformentioned range listed, please consider follow up with your Primary Care Provider.   ________________________________________________________  The [facility] providers would like to encourage you to use MYCHART to communicate with providers for non-urgent requests or questions.  Due to long hold times on the telephone, sending your provider a message by [facility] may be a faster and more efficient way to get a response.  Please allow 48 business hours for a response.  Please remember that this is for non-urgent requests.  _______________________________________________________

## 2023-07-12 NOTE — Progress Notes (Signed)
 Agree with the assessment and plan as outlined by Brigitte Canard, PA-C.

## 2023-07-13 MED ORDER — ACETAMINOPHEN 500 MG PO TABS
1000.0000 mg | ORAL_TABLET | ORAL | Status: AC
  Administered 2023-07-14: 1000 mg via ORAL

## 2023-07-13 MED ORDER — CHLORHEXIDINE GLUCONATE CLOTH 2 % EX PADS: 6.0000 | MEDICATED_PAD | Freq: Once | CUTANEOUS | Status: DC

## 2023-07-13 MED ORDER — CHLORHEXIDINE GLUCONATE 0.12 % MT SOLN
15.0000 mL | Freq: Once | OROMUCOSAL | Status: AC
  Administered 2023-07-14: 15 mL via OROMUCOSAL

## 2023-07-13 MED ORDER — GABAPENTIN 300 MG PO CAPS
300.0000 mg | ORAL_CAPSULE | ORAL | Status: AC
  Administered 2023-07-14: 300 mg via ORAL

## 2023-07-13 MED ORDER — CELECOXIB 200 MG PO CAPS
200.0000 mg | ORAL_CAPSULE | ORAL | Status: AC
  Administered 2023-07-14: 200 mg via ORAL

## 2023-07-13 MED ORDER — LACTATED RINGERS IV SOLN: INTRAVENOUS | Status: DC

## 2023-07-13 MED ORDER — ORAL CARE MOUTH RINSE: 15.0000 mL | Freq: Once | OROMUCOSAL | Status: AC

## 2023-07-13 MED ORDER — CEFAZOLIN SODIUM-DEXTROSE 2-4 GM/100ML-% IV SOLN
2.0000 g | INTRAVENOUS | Status: AC
  Administered 2023-07-14 (×2): 2 g via INTRAVENOUS

## 2023-07-14 ENCOUNTER — Encounter: Payer: Self-pay | Admitting: Surgery

## 2023-07-14 ENCOUNTER — Inpatient Hospital Stay
Admission: RE | Admit: 2023-07-14 | Discharge: 2023-07-16 | DRG: 355 | Disposition: A | Discharge status: Home / Self Care | Attending: Surgery | Admitting: Surgery

## 2023-07-14 ENCOUNTER — Encounter
Admission: RE | Disposition: A | Discharge status: Home / Self Care | Payer: Self-pay | Source: Home / Self Care | Attending: Surgery

## 2023-07-14 ENCOUNTER — Ambulatory Visit

## 2023-07-14 ENCOUNTER — Other Ambulatory Visit: Payer: Self-pay

## 2023-07-14 DIAGNOSIS — Z8719 Personal history of other diseases of the digestive system: Principal | ICD-10-CM

## 2023-07-14 DIAGNOSIS — Z8249 Family history of ischemic heart disease and other diseases of the circulatory system: Secondary | ICD-10-CM | POA: Diagnosis not present

## 2023-07-14 DIAGNOSIS — Z833 Family history of diabetes mellitus: Secondary | ICD-10-CM | POA: Diagnosis not present

## 2023-07-14 DIAGNOSIS — K432 Incisional hernia without obstruction or gangrene: Secondary | ICD-10-CM | POA: Diagnosis present

## 2023-07-14 DIAGNOSIS — I890 Lymphedema, not elsewhere classified: Secondary | ICD-10-CM | POA: Diagnosis present

## 2023-07-14 DIAGNOSIS — J449 Chronic obstructive pulmonary disease, unspecified: Secondary | ICD-10-CM | POA: Diagnosis present

## 2023-07-14 DIAGNOSIS — R197 Diarrhea, unspecified: Secondary | ICD-10-CM | POA: Diagnosis present

## 2023-07-14 DIAGNOSIS — Z880 Allergy status to penicillin: Secondary | ICD-10-CM | POA: Diagnosis not present

## 2023-07-14 DIAGNOSIS — K430 Incisional hernia with obstruction, without gangrene: Secondary | ICD-10-CM | POA: Diagnosis present

## 2023-07-14 DIAGNOSIS — G4730 Sleep apnea, unspecified: Secondary | ICD-10-CM | POA: Diagnosis present

## 2023-07-14 DIAGNOSIS — Z91013 Allergy to seafood: Secondary | ICD-10-CM

## 2023-07-14 DIAGNOSIS — Z6835 Body mass index (BMI) 35.0-35.9, adult: Secondary | ICD-10-CM | POA: Diagnosis not present

## 2023-07-14 DIAGNOSIS — Z9851 Tubal ligation status: Secondary | ICD-10-CM

## 2023-07-14 DIAGNOSIS — K436 Other and unspecified ventral hernia with obstruction, without gangrene: Secondary | ICD-10-CM | POA: Diagnosis present

## 2023-07-14 DIAGNOSIS — K219 Gastro-esophageal reflux disease without esophagitis: Secondary | ICD-10-CM | POA: Diagnosis present

## 2023-07-14 DIAGNOSIS — Z01818 Encounter for other preprocedural examination: Secondary | ICD-10-CM

## 2023-07-14 DIAGNOSIS — E079 Disorder of thyroid, unspecified: Secondary | ICD-10-CM | POA: Diagnosis present

## 2023-07-14 DIAGNOSIS — F17210 Nicotine dependence, cigarettes, uncomplicated: Secondary | ICD-10-CM | POA: Diagnosis present

## 2023-07-14 DIAGNOSIS — G8929 Other chronic pain: Secondary | ICD-10-CM | POA: Diagnosis present

## 2023-07-14 DIAGNOSIS — E669 Obesity, unspecified: Secondary | ICD-10-CM | POA: Diagnosis present

## 2023-07-14 HISTORY — PX: XI ROBOTIC ASSISTED VENTRAL HERNIA: SHX6789

## 2023-07-14 HISTORY — PX: INSERTION OF MESH: SHX5868

## 2023-07-14 LAB — POCT PREGNANCY, URINE: Preg Test, Ur: NEGATIVE

## 2023-07-14 SURGERY — REPAIR, HERNIA, VENTRAL, ROBOT-ASSISTED
Anesthesia: General

## 2023-07-14 MED ORDER — KETOROLAC TROMETHAMINE 30 MG/ML IJ SOLN
30.0000 mg | Freq: Four times a day (QID) | INTRAMUSCULAR | Status: DC
  Administered 2023-07-14 – 2023-07-16 (×8): 30 mg via INTRAVENOUS
  Filled 2023-07-14 (×8): qty 1

## 2023-07-14 MED ORDER — ONDANSETRON 4 MG PO TBDP: 4.0000 mg | ORAL_TABLET | Freq: Four times a day (QID) | ORAL | Status: DC | PRN

## 2023-07-14 MED ORDER — SODIUM CHLORIDE 0.9 % IV SOLN: INTRAVENOUS | Status: DC

## 2023-07-14 MED ORDER — PHENYLEPHRINE 80 MCG/ML (10ML) SYRINGE FOR IV PUSH (FOR BLOOD PRESSURE SUPPORT)
PREFILLED_SYRINGE | INTRAVENOUS | Status: DC | PRN
  Administered 2023-07-14 (×12): 80 ug via INTRAVENOUS
  Administered 2023-07-14: 160 ug via INTRAVENOUS

## 2023-07-14 MED ORDER — OXYCODONE HCL 5 MG/5ML PO SOLN: 5.0000 mg | Freq: Once | ORAL | Status: DC | PRN

## 2023-07-14 MED ORDER — KETAMINE HCL 50 MG/5ML IJ SOSY
PREFILLED_SYRINGE | INTRAMUSCULAR | Status: DC | PRN
  Administered 2023-07-14: 25 mg via INTRAVENOUS
  Administered 2023-07-14: 15 mg via INTRAVENOUS
  Administered 2023-07-14: 10 mg via INTRAVENOUS

## 2023-07-14 MED ORDER — PROPOFOL 10 MG/ML IV BOLUS
INTRAVENOUS | Status: AC
  Filled 2023-07-14: qty 20

## 2023-07-14 MED ORDER — GABAPENTIN 300 MG PO CAPS
600.0000 mg | ORAL_CAPSULE | Freq: Every day | ORAL | Status: DC
  Administered 2023-07-15: 600 mg via ORAL
  Filled 2023-07-14: qty 2

## 2023-07-14 MED ORDER — BACLOFEN 10 MG PO TABS
20.0000 mg | ORAL_TABLET | Freq: Every day | ORAL | Status: DC
  Administered 2023-07-14 – 2023-07-15 (×2): 20 mg via ORAL
  Filled 2023-07-14 (×2): qty 2

## 2023-07-14 MED ORDER — HYDROMORPHONE HCL 1 MG/ML IJ SOLN
INTRAMUSCULAR | Status: AC
  Filled 2023-07-14: qty 1

## 2023-07-14 MED ORDER — PROPOFOL 10 MG/ML IV BOLUS
INTRAVENOUS | Status: DC | PRN
  Administered 2023-07-14: 80 mg via INTRAVENOUS

## 2023-07-14 MED ORDER — ONDANSETRON HCL 4 MG/2ML IJ SOLN: 4.0000 mg | Freq: Four times a day (QID) | INTRAMUSCULAR | Status: DC | PRN

## 2023-07-14 MED ORDER — MIDAZOLAM HCL 2 MG/2ML IJ SOLN
INTRAMUSCULAR | Status: DC | PRN
  Administered 2023-07-14: 2 mg via INTRAVENOUS

## 2023-07-14 MED ORDER — ALBUMIN HUMAN 5 % IV SOLN
INTRAVENOUS | Status: AC
  Filled 2023-07-14: qty 250

## 2023-07-14 MED ORDER — FENTANYL CITRATE (PF) 100 MCG/2ML IJ SOLN: 25.0000 ug | INTRAMUSCULAR | Status: DC | PRN

## 2023-07-14 MED ORDER — DEXMEDETOMIDINE HCL IN NACL 80 MCG/20ML IV SOLN
INTRAVENOUS | Status: DC | PRN
  Administered 2023-07-14 (×5): 4 ug via INTRAVENOUS

## 2023-07-14 MED ORDER — MIDAZOLAM HCL 2 MG/2ML IJ SOLN
INTRAMUSCULAR | Status: AC
  Filled 2023-07-14: qty 2

## 2023-07-14 MED ORDER — SUGAMMADEX SODIUM 200 MG/2ML IV SOLN
INTRAVENOUS | Status: DC | PRN
  Administered 2023-07-14: 200 mg via INTRAVENOUS

## 2023-07-14 MED ORDER — ALBUMIN HUMAN 5 % IV SOLN: INTRAVENOUS | Status: DC | PRN

## 2023-07-14 MED ORDER — FENTANYL CITRATE (PF) 100 MCG/2ML IJ SOLN
INTRAMUSCULAR | Status: AC
  Filled 2023-07-14: qty 2

## 2023-07-14 MED ORDER — PANTOPRAZOLE SODIUM 40 MG PO TBEC
40.0000 mg | DELAYED_RELEASE_TABLET | Freq: Every day | ORAL | Status: DC
  Administered 2023-07-15 – 2023-07-16 (×2): 40 mg via ORAL
  Filled 2023-07-14 (×2): qty 1

## 2023-07-14 MED ORDER — KETAMINE HCL 50 MG/5ML IJ SOSY
PREFILLED_SYRINGE | INTRAMUSCULAR | Status: AC
  Filled 2023-07-14: qty 5

## 2023-07-14 MED ORDER — GLYCOPYRROLATE 0.2 MG/ML IJ SOLN
INTRAMUSCULAR | Status: AC
  Filled 2023-07-14: qty 1

## 2023-07-14 MED ORDER — OXYCODONE HCL 5 MG PO TABS: 5.0000 mg | ORAL_TABLET | ORAL | Status: DC | PRN

## 2023-07-14 MED ORDER — ROCURONIUM BROMIDE 10 MG/ML (PF) SYRINGE
PREFILLED_SYRINGE | INTRAVENOUS | Status: AC
  Filled 2023-07-14: qty 10

## 2023-07-14 MED ORDER — ONDANSETRON HCL 4 MG/2ML IJ SOLN
INTRAMUSCULAR | Status: DC | PRN
  Administered 2023-07-14: 4 mg via INTRAVENOUS

## 2023-07-14 MED ORDER — HYDROMORPHONE HCL 1 MG/ML IJ SOLN
INTRAMUSCULAR | Status: DC | PRN
  Administered 2023-07-14: .5 mg via INTRAVENOUS

## 2023-07-14 MED ORDER — ONDANSETRON HCL 4 MG/2ML IJ SOLN: 4.0000 mg | Freq: Once | INTRAMUSCULAR | Status: DC | PRN

## 2023-07-14 MED ORDER — PHENYLEPHRINE 80 MCG/ML (10ML) SYRINGE FOR IV PUSH (FOR BLOOD PRESSURE SUPPORT)
PREFILLED_SYRINGE | INTRAVENOUS | Status: AC
  Filled 2023-07-14: qty 10

## 2023-07-14 MED ORDER — ACETAMINOPHEN 500 MG PO TABS
ORAL_TABLET | ORAL | Status: AC
  Filled 2023-07-14: qty 2

## 2023-07-14 MED ORDER — OXYCODONE HCL 5 MG PO TABS: 5.0000 mg | ORAL_TABLET | Freq: Once | ORAL | Status: DC | PRN

## 2023-07-14 MED ORDER — SODIUM CHLORIDE (PF) 0.9 % IJ SOLN
INTRAMUSCULAR | Status: DC | PRN
  Administered 2023-07-14: 100 mL via INTRAMUSCULAR

## 2023-07-14 MED ORDER — DEXAMETHASONE SODIUM PHOSPHATE 10 MG/ML IJ SOLN
INTRAMUSCULAR | Status: AC
  Filled 2023-07-14: qty 1

## 2023-07-14 MED ORDER — CEFAZOLIN SODIUM-DEXTROSE 2-4 GM/100ML-% IV SOLN
2.0000 g | Freq: Three times a day (TID) | INTRAVENOUS | Status: AC
  Administered 2023-07-14 – 2023-07-15 (×3): 2 g via INTRAVENOUS
  Filled 2023-07-14 (×3): qty 100

## 2023-07-14 MED ORDER — LIDOCAINE HCL (PF) 2 % IJ SOLN
INTRAMUSCULAR | Status: AC
  Filled 2023-07-14: qty 5

## 2023-07-14 MED ORDER — LEVOTHYROXINE SODIUM 125 MCG PO TABS
125.0000 ug | ORAL_TABLET | Freq: Every day | ORAL | Status: DC
  Administered 2023-07-15 – 2023-07-16 (×2): 125 ug via ORAL
  Filled 2023-07-14 (×2): qty 1

## 2023-07-14 MED ORDER — SODIUM CHLORIDE (PF) 0.9 % IJ SOLN
INTRAMUSCULAR | Status: AC
  Filled 2023-07-14: qty 50

## 2023-07-14 MED ORDER — CEFAZOLIN SODIUM 1 G IJ SOLR
INTRAMUSCULAR | Status: AC
  Filled 2023-07-14: qty 20

## 2023-07-14 MED ORDER — GABAPENTIN 300 MG PO CAPS
ORAL_CAPSULE | ORAL | Status: AC
  Filled 2023-07-14: qty 1

## 2023-07-14 MED ORDER — ENOXAPARIN SODIUM 40 MG/0.4ML IJ SOSY
40.0000 mg | PREFILLED_SYRINGE | INTRAMUSCULAR | Status: DC
  Administered 2023-07-15 – 2023-07-16 (×2): 40 mg via SUBCUTANEOUS
  Filled 2023-07-14 (×2): qty 0.4

## 2023-07-14 MED ORDER — NICOTINE 14 MG/24HR TD PT24
14.0000 mg | MEDICATED_PATCH | Freq: Every day | TRANSDERMAL | Status: DC
  Filled 2023-07-14 (×2): qty 1

## 2023-07-14 MED ORDER — LIDOCAINE HCL (CARDIAC) PF 100 MG/5ML IV SOSY
PREFILLED_SYRINGE | INTRAVENOUS | Status: DC | PRN
  Administered 2023-07-14: 100 mg via INTRAVENOUS

## 2023-07-14 MED ORDER — CELECOXIB 200 MG PO CAPS
ORAL_CAPSULE | ORAL | Status: AC
  Filled 2023-07-14: qty 1

## 2023-07-14 MED ORDER — CEFAZOLIN SODIUM-DEXTROSE 2-4 GM/100ML-% IV SOLN
INTRAVENOUS | Status: AC
Start: 2023-07-14 — End: 2023-07-14
  Filled 2023-07-14: qty 100

## 2023-07-14 MED ORDER — BUPIVACAINE LIPOSOME 1.3 % IJ SUSP
INTRAMUSCULAR | Status: AC
  Filled 2023-07-14: qty 20

## 2023-07-14 MED ORDER — ONDANSETRON HCL 4 MG/2ML IJ SOLN
INTRAMUSCULAR | Status: AC
  Filled 2023-07-14: qty 2

## 2023-07-14 MED ORDER — FENTANYL CITRATE (PF) 100 MCG/2ML IJ SOLN
INTRAMUSCULAR | Status: DC | PRN
  Administered 2023-07-14 (×4): 50 ug via INTRAVENOUS

## 2023-07-14 MED ORDER — DEXAMETHASONE SODIUM PHOSPHATE 10 MG/ML IJ SOLN
INTRAMUSCULAR | Status: DC | PRN
  Administered 2023-07-14: 10 mg via INTRAVENOUS

## 2023-07-14 MED ORDER — MELATONIN 5 MG PO TABS: 2.5000 mg | ORAL_TABLET | Freq: Every evening | ORAL | Status: DC | PRN

## 2023-07-14 MED ORDER — DIPHENHYDRAMINE HCL 50 MG/ML IJ SOLN: 12.5000 mg | Freq: Four times a day (QID) | INTRAMUSCULAR | Status: DC | PRN

## 2023-07-14 MED ORDER — CHLORHEXIDINE GLUCONATE 0.12 % MT SOLN
OROMUCOSAL | Status: AC
Start: 2023-07-14 — End: 2023-07-14
  Filled 2023-07-14: qty 15

## 2023-07-14 MED ORDER — ROCURONIUM BROMIDE 100 MG/10ML IV SOLN
INTRAVENOUS | Status: DC | PRN
  Administered 2023-07-14: 10 mg via INTRAVENOUS
  Administered 2023-07-14: 20 mg via INTRAVENOUS
  Administered 2023-07-14: 10 mg via INTRAVENOUS
  Administered 2023-07-14: 30 mg via INTRAVENOUS
  Administered 2023-07-14: 50 mg via INTRAVENOUS
  Administered 2023-07-14: 10 mg via INTRAVENOUS

## 2023-07-14 MED ORDER — LACTATED RINGERS IV SOLN: INTRAVENOUS | Status: DC | PRN

## 2023-07-14 MED ORDER — METHOCARBAMOL 500 MG PO TABS: 500.0000 mg | ORAL_TABLET | Freq: Three times a day (TID) | ORAL | Status: DC | PRN

## 2023-07-14 MED ORDER — DIPHENHYDRAMINE HCL 12.5 MG/5ML PO ELIX: 12.5000 mg | ORAL_SOLUTION | Freq: Four times a day (QID) | ORAL | Status: DC | PRN

## 2023-07-14 MED ORDER — ACETAMINOPHEN 500 MG PO TABS
1000.0000 mg | ORAL_TABLET | Freq: Four times a day (QID) | ORAL | Status: DC
  Administered 2023-07-15 – 2023-07-16 (×7): 1000 mg via ORAL
  Filled 2023-07-14 (×8): qty 2

## 2023-07-14 MED ORDER — GLYCOPYRROLATE 0.2 MG/ML IJ SOLN
INTRAMUSCULAR | Status: DC | PRN
  Administered 2023-07-14 (×2): .1 mg via INTRAVENOUS

## 2023-07-14 MED ORDER — BUPIVACAINE-EPINEPHRINE (PF) 0.25% -1:200000 IJ SOLN
INTRAMUSCULAR | Status: AC
  Filled 2023-07-14: qty 30

## 2023-07-14 MED ORDER — ALBUTEROL SULFATE (2.5 MG/3ML) 0.083% IN NEBU: 2.5000 mg | INHALATION_SOLUTION | Freq: Four times a day (QID) | RESPIRATORY_TRACT | Status: DC | PRN

## 2023-07-14 MED ORDER — HYDRALAZINE HCL 20 MG/ML IJ SOLN: 10.0000 mg | INTRAMUSCULAR | Status: DC | PRN

## 2023-07-14 MED ORDER — MORPHINE SULFATE (PF) 4 MG/ML IV SOLN: 4.0000 mg | INTRAVENOUS | Status: DC | PRN

## 2023-07-14 MED ORDER — METHOCARBAMOL 1000 MG/10ML IJ SOLN: 500.0000 mg | Freq: Three times a day (TID) | INTRAMUSCULAR | Status: DC | PRN

## 2023-07-14 SURGICAL SUPPLY — 49 items
BARRIER ADH SEPRAFILM 3INX5IN (MISCELLANEOUS) IMPLANT
COVER TIP SHEARS 8 DVNC (MISCELLANEOUS) ×2 IMPLANT
COVER WAND RF STERILE (DRAPES) ×2 IMPLANT
DERMABOND ADVANCED .7 DNX12 (GAUZE/BANDAGES/DRESSINGS) ×2 IMPLANT
DRAIN CHANNEL 19F RND (DRAIN) IMPLANT
DRAPE ARM DVNC X/XI (DISPOSABLE) ×6 IMPLANT
DRAPE COLUMN DVNC XI (DISPOSABLE) ×2 IMPLANT
DRSG OPSITE POSTOP 3X4 (GAUZE/BANDAGES/DRESSINGS) IMPLANT
DRSG TEGADERM 4X4.75 (GAUZE/BANDAGES/DRESSINGS) IMPLANT
ELECT BLADE 6.5 EXT (BLADE) IMPLANT
ELECTRODE REM PT RTRN 9FT ADLT (ELECTROSURGICAL) ×2 IMPLANT
EVACUATOR SILICONE 100CC (DRAIN) IMPLANT
FORCEPS BPLR R/ABLATION 8 DVNC (INSTRUMENTS) ×2 IMPLANT
GLOVE BIO SURGEON STRL SZ7 (GLOVE) ×4 IMPLANT
GOWN STRL REUS W/ TWL LRG LVL3 (GOWN DISPOSABLE) ×6 IMPLANT
GRASPER SUT TROCAR 14GX15 (MISCELLANEOUS) ×2 IMPLANT
IRRIGATION STRYKERFLOW (MISCELLANEOUS) IMPLANT
IV NS 1000ML BAXH (IV SOLUTION) IMPLANT
KIT PINK PAD W/HEAD ARM REST (MISCELLANEOUS) ×2 IMPLANT
LABEL OR SOLS (LABEL) ×2 IMPLANT
MANIFOLD NEPTUNE II (INSTRUMENTS) ×2 IMPLANT
MESH SOFT 12X12IN BARD (Mesh General) IMPLANT
NDL DRIVE SUT CUT DVNC (INSTRUMENTS) ×2 IMPLANT
NDL HYPO 22X1.5 SAFETY MO (MISCELLANEOUS) ×2 IMPLANT
NDL INSUFFLATION 14GA 120MM (NEEDLE) ×2 IMPLANT
NEEDLE DRIVE SUT CUT DVNC (INSTRUMENTS) ×2 IMPLANT
NEEDLE HYPO 22X1.5 SAFETY MO (MISCELLANEOUS) ×2 IMPLANT
NEEDLE INSUFFLATION 14GA 120MM (NEEDLE) ×2 IMPLANT
OBTURATOR OPTICALSTD 8 DVNC (TROCAR) ×2 IMPLANT
PACK LAP CHOLECYSTECTOMY (MISCELLANEOUS) ×2 IMPLANT
PREVENA INCISION MGT 90 150 (MISCELLANEOUS) IMPLANT
SCISSORS MNPLR CVD DVNC XI (INSTRUMENTS) ×2 IMPLANT
SEAL UNIV 5-12 XI (MISCELLANEOUS) ×6 IMPLANT
SET TUBE SMOKE EVAC HIGH FLOW (TUBING) ×2 IMPLANT
SOLUTION ELECTROSURG ANTI STCK (MISCELLANEOUS) ×2 IMPLANT
SPONGE DRAIN TRACH 4X4 STRL 2S (GAUZE/BANDAGES/DRESSINGS) IMPLANT
SPONGE KITTNER 5P (MISCELLANEOUS) IMPLANT
SPONGE T-LAP 18X18 ~~LOC~~+RFID (SPONGE) ×2 IMPLANT
SUT PDS AB 0 CT1 27 (SUTURE) IMPLANT
SUT STRATA 2-0 30 CT-2 (SUTURE) ×2 IMPLANT
SUT STRATAFIX PDS 30 CT-1 (SUTURE) ×2 IMPLANT
SUT VIC AB 3-0 SH 27X BRD (SUTURE) IMPLANT
SUT VICRYL 0 UR6 27IN ABS (SUTURE) IMPLANT
SUTURE EHLN 3-0 FS-10 30 BLK (SUTURE) IMPLANT
SUTURE MNCRL 4-0 27XMF (SUTURE) ×2 IMPLANT
SYR 20ML LL LF (SYRINGE) ×2 IMPLANT
TAPE TRANSPORE STRL 2 31045 (GAUZE/BANDAGES/DRESSINGS) ×2 IMPLANT
TRAP FLUID SMOKE EVACUATOR (MISCELLANEOUS) ×2 IMPLANT
WATER STERILE IRR 500ML POUR (IV SOLUTION) ×2 IMPLANT

## 2023-07-14 NOTE — Anesthesia Procedure Notes (Signed)
 Procedure Name: Intubation Date/Time: 07/14/2023 9:39 AM  Performed by: Jackye Spanner, CRNAPre-anesthesia Checklist: Patient identified, Patient being monitored, Timeout performed, Emergency Drugs available and Suction available Patient Re-evaluated:Patient Re-evaluated prior to induction Oxygen Delivery Method: Circle system utilized Preoxygenation: Pre-oxygenation with 100% oxygen Induction Type: IV induction Ventilation: Mask ventilation without difficulty Laryngoscope Size: 3 and McGrath Grade View: Grade I Tube type: Oral Tube size: 7.0 mm Number of attempts: 1 Airway Equipment and Method: Stylet Placement Confirmation: ETT inserted through vocal cords under direct vision, positive ETCO2 and breath sounds checked- equal and bilateral Secured at: 22 cm Tube secured with: Tape Dental Injury: Teeth and Oropharynx as per pre-operative assessment  Comments: Smooth atraumatic intubation, no complications noted.

## 2023-07-14 NOTE — Plan of Care (Signed)

## 2023-07-14 NOTE — Anesthesia Preprocedure Evaluation (Signed)
 Anesthesia Evaluation  Patient identified by MRN, date of birth, ID band Patient awake    Reviewed: Allergy & Precautions, NPO status , Patient's Chart, lab work & pertinent test results  Airway Mallampati: III  TM Distance: >3 FB Neck ROM: full    Dental  (+) Edentulous Upper, Edentulous Lower   Pulmonary neg pulmonary ROS, sleep apnea , COPD,  COPD inhaler, Current Smoker and Patient abstained from smoking.   Pulmonary exam normal  + decreased breath sounds      Cardiovascular Exercise Tolerance: Good negative cardio ROS Normal cardiovascular exam Rhythm:Regular Rate:Normal     Neuro/Psych negative neurological ROS  negative psych ROS   GI/Hepatic negative GI ROS, Neg liver ROS,GERD  Medicated,,  Endo/Other  negative endocrine ROSHypothyroidism  Class 3 obesity  Renal/GU negative Renal ROS  negative genitourinary   Musculoskeletal   Abdominal  (+) + obese  Peds negative pediatric ROS (+)  Hematology negative hematology ROS (+)   Anesthesia Other Findings Past Medical History: No date: Dyspnea No date: GERD (gastroesophageal reflux disease) No date: Hypothyroidism No date: Liver hemangioma No date: Lymphedema No date: Sleep apnea     Comment:  no cpap No date: Thyroid disease  Past Surgical History: 2014: CESAREAN SECTION 2005: CHOLECYSTECTOMY 03/09/2023: COLONOSCOPY WITH PROPOFOL ; N/A     Comment:  Procedure: COLONOSCOPY WITH PROPOFOL ;  Surgeon: Therisa Bi, MD;  Location: [facility] ENDOSCOPY;  Service:               Gastroenterology;  Laterality: N/A; No date: MANDIBLE FRACTURE SURGERY No date: MULTIPLE TOOTH EXTRACTIONS 2014: TUBAL LIGATION  BMI    Body Mass Index: 35.57 kg/m      Reproductive/Obstetrics negative OB ROS                              Anesthesia Physical Anesthesia Plan  ASA: 3  Anesthesia Plan: General   Post-op Pain Management:     Induction: Intravenous  PONV Risk Score and Plan: 1 and Ondansetron and Dexamethasone   Airway Management Planned: Oral ETT  Additional Equipment:   Intra-op Plan:   Post-operative Plan: Extubation in OR  Informed Consent: I have reviewed the patients History and Physical, chart, labs and discussed the procedure including the risks, benefits and alternatives for the proposed anesthesia with the patient or authorized representative who has indicated his/her understanding and acceptance.     Dental Advisory Given  Plan Discussed with: CRNA  Anesthesia Plan Comments:         Anesthesia Quick Evaluation

## 2023-07-14 NOTE — Interval H&P Note (Signed)
 History and Physical Interval Note:  07/14/2023 8:59 AM  Karen Hill  has presented today for surgery, with the diagnosis of Recurrent ventral hernia greater 10 cm.  The various methods of treatment have been discussed with the patient and family. After consideration of risks, benefits and other options for treatment, the patient has consented to  Procedure(s): REPAIR, HERNIA, VENTRAL, ROBOT-ASSISTED (N/A) as a surgical intervention.  The patient's history has been reviewed, patient examined, no change in status, stable for surgery.  I have reviewed the patient's chart and labs.  Questions were answered to the patient's satisfaction.     Dula Havlik F Sharetta Ricchio

## 2023-07-14 NOTE — Transfer of Care (Signed)
 Immediate Anesthesia Transfer of Care Note  Patient: Karen Hill  Procedure(s) Performed: REPAIR, HERNIA, VENTRAL, ROBOT-ASSISTED CONVERTED TO OPEN PROCEDURE INSERTION OF MESH  Patient Location: PACU  Anesthesia Type:General  Level of Consciousness: drowsy  Airway & Oxygen Therapy: Patient Spontanous Breathing and Patient connected to face mask oxygen  Post-op Assessment: Report given to RN and Post -op Vital signs reviewed and stable  Post vital signs: Reviewed and stable  Last Vitals:  Vitals Value Taken Time  BP 109/66 07/14/23 15:19  Temp 35.8 1519  Pulse 93 07/14/23 15:23  Resp 17 07/14/23 15:23  SpO2 100 % 07/14/23 15:23  Vitals shown include unfiled device data.  Last Pain:  Vitals:   07/14/23 0836  TempSrc: Temporal  PainSc: 0-No pain         Complications: No notable events documented.

## 2023-07-14 NOTE — Op Note (Signed)
 PROCEDURES: Attempted Robotic assisted hernia repair converted to open 2. Abdominal wall reconstruction with bilateral myocutaneous flaps using a TAR release and a medium weight macro porus polypropylene mesh ( BARD) measuring 30.5 x 30.5 cm Diamond configuration,  2. Incisional hernia repair Open recurrent incarcerated 10cms 3. Wound vac Placement 30 x 2 cm ( Prevena plus)   Pre-operative Diagnosis: recurrent symptomatic ventral ventral hernia with loss of domain  Post-operative Diagnosis: Same   Surgeon: Laneta FALCON Jatavius Ellenwood   Anesthesia: General endotracheal anesthesia  ASA Class: 2  Surgeon: Laneta Luna , MD FACS  Anesthesia: Gen. with endotracheal tube  Assistant: Darlyn Platt PAC( essential due to the complexity of the procedure and to have adequate exposure)  Findings: Large ventral hernia 10 cms with loss of domain Omentum and transverse colon chronically incarcerated  Great coverage of defect with reconstruction of  her abd wall  Difficult case due to body habitus, massive hernia sac and the need to convert to open case   Estimated Blood Loss: 150cc         Drains: 19 FR x 2  Left retro rectus and Right subq             Complications: none        Condition: stable  Procedure Details  The patient was seen again in the Holding Room. The benefits, complications, treatment options, and expected outcomes were discussed with the patient. The risks of bleeding, infection, recurrence of symptoms, failure to resolve symptoms,  bowel injury, any of which could require further surgery were reviewed with the patient.   The patient was taken to Operating Room, identified  and the procedure verified.  A Time Out was held and the above information confirmed.  I started with a low infraumbilical incision, cut down technique was used and fascia was elevated and incised with cautery, Peritoneum elevated and metz used to entered the abdominal cavity under direct visualization. NO evidence of  injuries were seen. Two stay sutures placed and Hasson trochar inserted, pneumoperitoneum obtained and two additional 8 mm ports were placed under direct vision. THe Robot was brought to the table and dock in the standard fashion, instrumentation was kept under direct vision at all times and we avoided any collisions between the arms of the Robot.  Inspection revealed a large defect with chronic incarcerated omentum and Colon. With gentle tractions the omentum was reduced, this allowed more space and I transected a few omental adhesions freeing up the hernia sac. THe Colon was able to be reduced to the abdominal cavity. No bowel injuries were encountered. I started by creating a peritoneal flap inferior to the defect . A good pocket was created, The posterior rectus sheath was identified bilaterally and incised, this allowed us  to entered the retro rectus space. Please note that we attempted to reduce the sac but it was massive. Moreover due to her body habitus I was not able to obtain the correct angle for the instrumentation to perform a TAR releases and appropiate reduction of the hernia sac. I also undocked the robot and place an additional lateral port to the left abdominal wall. Even after those maneuvers I was not able to obtain a angle that allowed me to safely continue with the operation.  After an hour of robotic approach I decided to convert to an open procedure. The robotic instruments were removed and the  robot was undocked; then the ports were removed under direct visualization and pneumoperitoneum evacuated.  Open approach was started,  Generous elliptical incision was created as a midline laparotomy incision. The subcutaneous tissue is divided and the fascia and the hernia sac are identified. The peritoneal cavity was entered. Some lyses of adhesions were performed using a combination of electrocautery and Metzenbaum scissors.  There was significant adhesions from the omenutm to the abdominal  wall , I inspected the colon and rest of the bowel w/o any evidence of injuries.   Once we had good lysis of adhesions we entered the retrorectus space by identifying/ incising the posterior sheath. This dissection is then performed laterally to the linea semilunaris, the most lateral aspect of the retrorectus space, using a combination of both blunt and sharp dissection  The retromuscular space is then bluntly developed to as far as the lateral border of the psoas muscle. The dissection is repeated on the opposite site and  Was carried superiorly to the central tendon of the diaphragm, using a subxiphoid and retrosternal dissection. Dissection also  was developed inferiorly to the retropubic space.   Next, the posterior rectus sheaths are approximated to one another with 0 PDS suture.  Using liposomal Marcaine TAP Block was performed on each side under direct visualization.   I Selected a 30 x 30 cm macro porus  polypropylene mesh and in a diamond configuration place it anterior to the peritoneum.  Mesh was Fix it to both the pubic and the side foot with interrupted 0 PDS sutures.  The mesh lay very flat on in a corset fashion.  I was very happy with the repair.  A 19 [place] Blake drain was placed in the retrorectus space in the standard fashion.  This retrorectus drain was placed on the left inferior abdominal wall.  Attention Was turned to the anterior sheath.  This was approximated with multiple running 0 PDS sutures.  We made sure that we released some of the tension that was attached to the subcutaneous tissue. Due to the significant dead space I decided to also place a 19 Blake drain within the subcutaneous tissue.  The exit of this drain was to the Right lower quadrant.  The wound was irrigated with saline and sterile water.  It was approximated with a staples and a Prevena plus wound VAC was placed in the standard fashion.  Needle and laparotomy count were correct and there were no immediate  complications  Please note that The Case was  difficult due to chronic nature of the disease, body habitus and massive hernia sac;  The work required for the procedure was significantly more than usual.The procedure demanded more physical and mental effort from the provider given above circumstances.  Mr Terryl Was essential for this complex case, he helped me with appropriate exposure, hernia repair , flap creation and closure.   Laneta Luna, MD, FACS

## 2023-07-15 LAB — COMPREHENSIVE METABOLIC PANEL WITH GFR
ALT: 9 U/L (ref 0–44)
AST: 18 U/L (ref 15–41)
Albumin: 2.7 g/dL — ABNORMAL LOW (ref 3.5–5.0)
Alkaline Phosphatase: 53 U/L (ref 38–126)
Anion gap: 6 (ref 5–15)
BUN: 8 mg/dL (ref 6–20)
CO2: 27 mmol/L (ref 22–32)
Calcium: 8.8 mg/dL — ABNORMAL LOW (ref 8.9–10.3)
Chloride: 110 mmol/L (ref 98–111)
Creatinine, Ser: 0.58 mg/dL (ref 0.44–1.00)
GFR, Estimated: >60 mL/min (ref >60)
Glucose, Bld: 118 mg/dL — ABNORMAL HIGH (ref 70–99)
Potassium: 4.2 mmol/L (ref 3.5–5.1)
Sodium: 143 mmol/L (ref 135–145)
Total Bilirubin: 1 mg/dL (ref 0.0–1.2)
Total Protein: 5.1 g/dL — ABNORMAL LOW (ref 6.5–8.1)

## 2023-07-15 LAB — MAGNESIUM: Magnesium: 1.8 mg/dL (ref 1.7–2.4)

## 2023-07-15 LAB — CBC
HCT: 39.2 % (ref 36.0–46.0)
Hemoglobin: 12.2 g/dL (ref 12.0–15.0)
MCH: 30.7 pg (ref 26.0–34.0)
MCHC: 31.1 g/dL (ref 30.0–36.0)
MCV: 98.5 fL (ref 80.0–100.0)
Platelets: 156 K/uL (ref 150–400)
RBC: 3.98 MIL/uL (ref 3.87–5.11)
RDW: 12.9 % (ref 11.5–15.5)
WBC: 13.8 K/uL — ABNORMAL HIGH (ref 4.0–10.5)
nRBC: 0 % (ref 0.0–0.2)

## 2023-07-15 LAB — PHOSPHORUS: Phosphorus: 4 mg/dL (ref 2.5–4.6)

## 2023-07-15 LAB — HIV ANTIBODY (ROUTINE TESTING W REFLEX): HIV Screen 4th Generation wRfx: NONREACTIVE

## 2023-07-15 NOTE — Progress Notes (Signed)
 Wound vac not working at this time. No power to device. Number on wound vac called several times with no answer.

## 2023-07-15 NOTE — Evaluation (Addendum)
 Physical Therapy Evaluation & Discharge Patient Details Name: Karen Hill MRN: [id number] DOB: 1975/10/16 Today's Date: 07/15/2023  History of Present Illness  Pt is a 48 y/o F admitted on 07/14/23 for scheduled ventral hernia repair. PMH: liver hemangioma, lymphedema, thyroid disease, c-section, tubal ligation  Clinical Impression  Pt seen for PT evaluation with pt agreeable to tx. Pt reports prior to admission she was independent with PRN use of SPC or RW, denies falls, still driving. On this date, pt is able to complete bed mobility with mod I, HOB elevated. Pt ambulates in room without AD without LOB & ambulates in hallway without AD with mod I. Pt practices pushing IV pole & encouraged to walk throughout the day. Pt negotiates 1 step x 2 with 1UE support to simulate home environment with supervision but no LOB noted. At this time, pt does not require acute PT services. PT to complete current orders, please re-consult if new needs arise.      If plan is discharge home, recommend the following: Assistance with cooking/housework   Can travel by private vehicle        Equipment Recommendations None recommended by PT  Recommendations for Other Services       Functional Status Assessment Patient has not had a recent decline in their functional status     Precautions / Restrictions Precautions Precaution/Restrictions Comments: log roll for comfort Restrictions Weight Bearing Restrictions Per Provider Order: No      Mobility  Bed Mobility Overal bed mobility: Modified Independent, Needs Assistance Bed Mobility: Supine to Sit     Supine to sit: Modified independent (Device/Increase time), HOB elevated, Used rails (exit R side of bed)          Transfers Overall transfer level: Modified independent                 General transfer comment: sit>stand from EOB, BSC    Ambulation/Gait Ambulation/Gait assistance: Modified independent (Device/Increase time) Gait  Distance (Feet): 350 Feet Assistive device: None Gait Pattern/deviations: Decreased dorsiflexion - right, Step-through pattern Gait velocity: slightly decreased     General Gait Details: Ambulates 2 laps around nurses station without AD, practices pushing IV pole without LOB but able to ambulate without AD with mod I.  Stairs Stairs: Yes Stairs assistance: Supervision Stair Management: One rail Left Number of Stairs: 1 (x 2 times (6)) General stair comments: Pt holds to R descending rail to simulate holding to object she uses at home (door knob, side of house).  Wheelchair Mobility     Tilt Bed    Modified Rankin (Stroke Patients Only)       Balance Overall balance assessment: Needs assistance Sitting-balance support: Feet supported Sitting balance-Leahy Scale: Good     Standing balance support: During functional activity, No upper extremity supported Standing balance-Leahy Scale: Good                               Pertinent Vitals/Pain Pain Assessment Pain Assessment: 0-10 Pain Score: 3  Pain Location: L rib, more with coughing Pain Descriptors / Indicators: Discomfort Pain Intervention(s): Monitored during session    Home Living Family/patient expects to be discharged to:: Private residence Living Arrangements: Spouse/significant other;Children (disabled spouse, 22 y/o daughter (has autism)) Available Help at Discharge: Family;Available 24 hours/day Type of Home: Mobile home Home Access: Stairs to enter Entrance Stairs-Rails: None Entrance Stairs-Number of Steps: 4-5     Home Equipment: Agricultural consultant (  2 wheels);Cane - single point;Shower seat;Hand held shower head (can borrow her spouse's RW)      Prior Function               Mobility Comments: denies falls in the past 6 months, ambulates with SPC or RW PRN ADLs Comments: bathe & dresses herself, driving     Extremity/Trunk Assessment   Upper Extremity Assessment Upper Extremity  Assessment: Overall WFL for tasks assessed    Lower Extremity Assessment Lower Extremity Assessment: Overall WFL for tasks assessed       Communication   Communication Communication: No apparent difficulties    Cognition Arousal: Alert Behavior During Therapy: WFL for tasks assessed/performed   PT - Cognitive impairments: No apparent impairments                         Following commands: Intact       Cueing Cueing Techniques: Verbal cues     General Comments General comments (skin integrity, edema, etc.): Continent void on BSC, performs peri hygiene without assistance. Pt's wound vac beeping throughout session - nurse made aware, reports she's already aware & so is MD.    Exercises     Assessment/Plan    PT Assessment Patient does not need any further PT services  PT Problem List         PT Treatment Interventions      PT Goals (Current goals can be found in the Care Plan section)  Acute Rehab PT Goals Patient Stated Goal: recover, get better PT Goal Formulation: With patient Time For Goal Achievement: 07/29/23 Potential to Achieve Goals: Good    Frequency       Co-evaluation               AM-PAC PT 6 Clicks Mobility  Outcome Measure Help needed turning from your back to your side while in a flat bed without using bedrails?: None Help needed moving from lying on your back to sitting on the side of a flat bed without using bedrails?: None Help needed moving to and from a bed to a chair (including a wheelchair)?: None Help needed standing up from a chair using your arms (e.g., wheelchair or bedside chair)?: None Help needed to walk in hospital room?: None Help needed climbing 3-5 steps with a railing? : A Little 6 Click Score: 23    End of Session   Activity Tolerance: Patient tolerated treatment well Patient left: in chair;with call bell/phone within reach Nurse Communication: Mobility status      Time: 1359-1419 PT Time  Calculation (min) (ACUTE ONLY): 20 min   Charges:   PT Evaluation $PT Eval Low Complexity: 1 Low   PT General Charges $$ ACUTE PT VISIT: 1 Visit         Richerd Pinal, PT, DPT 07/15/23, 2:33 PM   Richerd CHRISTELLA Pinal 07/15/2023, 2:31 PM

## 2023-07-15 NOTE — Plan of Care (Signed)

## 2023-07-15 NOTE — Progress Notes (Signed)
 POD # 1 Doing well Some soreness AVSS JP  combined 180cc serosanguineous  PE NAD Abd: soft, JP w serosanguinous fluid, no peritonitis or infection, prevena in place  A/P Doing well DC foley Mobilize Still requiring IV narcotics Advance diet DC 24-48 hrs

## 2023-07-16 MED ORDER — ONDANSETRON HCL 4 MG PO TABS: 4.0000 mg | ORAL_TABLET | Freq: Three times a day (TID) | ORAL | 0 refills | Status: DC | PRN

## 2023-07-16 MED ORDER — OXYCODONE-ACETAMINOPHEN 5-325 MG PO TABS: 1.0000 | ORAL_TABLET | ORAL | 0 refills | Status: DC | PRN

## 2023-07-16 NOTE — Discharge Instructions (Signed)
 Surgical [facility] Care Surgical drains are placed during surgery. They are used to get rid of the extra fluid that can build up in a wound after surgery. They can also help heal the wound. The kinds of drains include: Active drains. These use suction to pull drainage away from the wound. Drainage flows through a tube to a container outside of the body. With these drains, you need to keep the bulb or the container flat (compressed) at all times, except while being emptied. Flattening the bulb or container creates suction. One of the most common types of active drains is the Jackson-Pratt (JP) drain. Passive drains. These allow fluid to drain using gravity rather than suction. Drainage flows through a tube to a bandage (dressing) or a container outside of the body. These drains do not need to be emptied. One of the most common types of passive drains is the Penrose drain. Right after surgery, drainage is often bright red and a little thicker than water. It may turn yellow or pink over time and become thinner. Your health care provider may remove the drain when the drainage stops or when the amount decreases to 1-2 tbsp (15-30 mL) in a 24-hour period. How to care for your surgical drain Care for your drain as told by your provider. Keep the skin around the drain dry and covered with a dressing at all times. This can help to prevent infection. If the drain is placed at your back, or in any other hard-to-reach area, ask someone to help you change the dressing, empty the drain, and check for infection. Changing the dressing Follow instructions from your provider about how to change your dressing. Change it at least once a day. Change it more often if needed to keep the dressing dry. Make sure you: Gather your supplies. These may include: Tape. Germ-free cleaning solution (sterile saline). Cotton swabs. Split gauze drain sponge: 4 x 4 inches (10 x 10 cm). Gauze square: 4 x 4 inches (10 x 10 cm). Wash your  hands with soap and water for at least 20 seconds before and after you change your dressing. If soap and water are not available, use hand sanitizer. Remove the old dressing. Avoid using scissors to do that. Wash your hands with soap and water again after taking off the old dressing. Use sterile saline to clean the skin around the drain. You may need to use a cotton swab to clean the skin. Place the tube through the slit in a drain sponge. Place the drain sponge so that it covers your wound. Place the gauze square or another drain sponge on top of the drain sponge that is on the wound. Make sure the tube is between those layers. Tape the dressing to your skin. Then, tape the drainage tube to your skin 1-2 inches (2.5-5 cm) below the place where the tube enters your body. Taping keeps the tube from pulling on any stitches (sutures) that you have. Wash your hands with soap and water. Write down the color of your drainage and how often you change your dressing. Emptying the active drain  Make sure you have a measuring cup that you can empty your drainage into. Wash your hands with soap and water for at least 20 seconds before and after you empty your drain. If soap and water are not available, use hand sanitizer. Loosen any pins or clips that hold the tube in place. If your provider tells you to strip the tube to prevent clots and  tube blockages: Hold the tube at the skin with one hand. Use your other hand to pinch the tube with your thumb and first finger. Gently move your fingers down the tube while squeezing very lightly. This clears any drainage, clots, or tissue from the tube. You may need to do this a few times a day to keep the tube clear. Do not pull on the tube. Open the bulb cap or the drain plug. Do not touch the inside of the cap or the bottom of the plug. Turn the device upside down and gently squeeze. Empty all of the drainage into the measuring cup. Compress the bulb or the container.  Replace the cap or the plug. To compress the bulb or the container, squeeze it firmly in the middle while you close the cap or plug the container. Write down the amount of drainage that you have in each 24-hour period. If you have less than 2 tbsp (30 mL) of drainage during the 24 hours, contact your provider. Flush the drainage down the toilet. Wash your hands with soap and water. Checking for infection Check your drain area every day for signs of infection. Check for: Redness, swelling, or pain. Warmth. Pus or a bad smell. Drainage that looks cloudy. Tenderness or pressure at the spot where the drain leaves your body. Contact a health care provider if: You have any signs of infection around your drain area. You have a fever or chills. The amount of drainage that you have stops all of a sudden, or the drainage increases rather than decreases. Your tube falls out. Your active drain does not stay compressed after you empty it. The tube gets detached from the bulb or container. This information is not intended to replace advice given to you by your health care provider. Make sure you discuss any questions you have with your health care provider. Document Revised: 08/14/2021 Document Reviewed: 08/14/2021 Elsevier Patient Education  2024 [employer]  Open Hernia Repair, Adult, Care After After an open hernia repair, it's common to have: Pain in your belly. Slight bruising. A little swelling. A small amount of blood from the cut from surgery. Follow these instructions at home: Medicines Take your medicines only as told by your health care provider. If told, take steps to prevent trouble pooping. You may need to: Drink enough fluid to keep your pee (urine) pale yellow. Take medicines to help you poop. Eat foods high in fiber. These include beans, whole grains, and fresh fruits and vegetables. Ask your provider if you should avoid driving or using machines while you're taking your  medicine. If you were given antibiotics, take them as told by your provider. Do not stop taking them even if you start to feel better. Incision care  Take care of your cut from surgery as told by your provider. Make sure you: Wash your hands with soap and water for at least 20 seconds before and after you change your bandage. If you can't use soap and water, use hand sanitizer. Change your bandage. Leave stitches or skin glue in place for at least 2 weeks. Leave tape strips alone unless you're told to take them off. You may trim the edges of the tape strips if they curl up. Check the area around your cut every day for signs of infection. Check for: More redness, swelling, or pain. More fluid or blood. Warmth. Pus or a bad smell. Wear loose clothes while your cut heals. Activity Rest as told. You may have to  avoid lifting. Ask how much weight you can safely lift. Do not play contact sports until your provider says it's safe. Return to normal activities when you're told. Ask what things are safe for you to do. General instructions If you were given a sedative during your surgery, do not drive or use machines until you're told it's safe. A sedative can make you sleepy. Do not take baths, swim, or use a hot tub until told. Ask if showers are okay. You may need to take sponge baths only. Hold a pillow over your belly when you cough or sneeze. This helps with pain. Do not smoke, vape, or use products with nicotine  or tobacco in them. If you need help quitting, talk with your provider. Your provider may give you more instructions. Make sure you know what you can and can't do. Contact a health care provider if: You have signs of infection. You have a fever or chills. You have blood in your poop. You haven't pooped in 2-3 days. Your pain doesn't get better with medicine. Get help right away if: You have chest pain. You're short of breath. You feel faint or light-headed. You have very bad  pain. You start to vomit. You have pain, swelling, or redness in a leg. These symptoms may be an emergency. Call 911 right away. Do not wait to see if the symptoms will go away. Do not drive yourself to the hospital. This information is not intended to replace advice given to you by your health care provider. Make sure you discuss any questions you have with your health care provider. Document Revised: 09/30/2022 Document Reviewed: 03/23/2022 Elsevier Patient Education  2024 [employer].

## 2023-07-16 NOTE — Discharge Summary (Signed)
 Patient ID: Karen Hill MRN: [id number] DOB/AGE: 01/23/1975 48 y.o.  Admit date: 07/14/2023 Discharge date: 07/16/2023   Discharge Diagnoses:  Principal Problem:   S/P repair of recurrent ventral hernia Active Problems:   Incarcerated ventral hernia   Procedures: Abdominal wall reconstruction with repair recurrent ventral hernia  Hospital Course:   48 year old female with symptomatic recurrent ventral hernia is chronically incarcerated underwent an elective scheduled abdominal wall reconstruction with repair of ventral hernia in an open fashion.  She had an attempted robotic approach but this was technically not feasible.  She was kept for 2 nights and she did very well considering the magnitude of the surgery. At The time of discharge the patient was ambulating,  pain was controlled.  She was tolerating regular diet ,her vital signs were stable and she was afebrile.   physical exam at discharge showed a pt  in no acute distress.  Awake and alert.  Abdomen: Soft incisions healing well without infection or peritonitis.  Drains with serosanguineous fluid and Praveena in place. extremities well-perfused and no edema.  Condition of the patient the time of discharge was stable     Disposition: Discharge disposition: 01-Home or Self Care       Discharge Instructions     Call MD for:  difficulty breathing, headache or visual disturbances   Complete by: As directed    Call MD for:  extreme fatigue   Complete by: As directed    Call MD for:  hives   Complete by: As directed    Call MD for:  persistant dizziness or light-headedness   Complete by: As directed    Call MD for:  persistant nausea and vomiting   Complete by: As directed    Call MD for:  redness, tenderness, or signs of infection (pain, swelling, redness, odor or green/yellow discharge around incision site)   Complete by: As directed    Call MD for:  severe uncontrolled pain   Complete by: As directed    Call MD for:   temperature >100.4   Complete by: As directed    Diet - low sodium heart healthy   Complete by: As directed    Discharge instructions   Complete by: As directed    Please teach pt about JP care, she will go home with JPs and Prevena wound vac   Increase activity slowly   Complete by: As directed    Lifting restrictions   Complete by: As directed    20 lbs x 6 wks      Allergies as of 07/16/2023       Reactions   Penicillin G Hives, Itching   Amoxicillin    Shellfish Allergy Nausea And Vomiting        Medication List     TAKE these medications    albuterol  108 (90 Base) MCG/ACT inhaler Commonly known as: ProAir  HFA Inhale 2 puffs into the lungs every 6 (six) hours as needed for wheezing or shortness of breath.   baclofen  10 MG tablet Commonly known as: LIORESAL  TAKE 1 TABLET BY MOUTH TWICE A DAY AS NEEDED FOR MUSCLE SPASMS What changed: See the new instructions.   cyanocobalamin  1000 MCG/ML injection Commonly known as: VITAMIN B12 INJECT 1 ML (1,000 MCG TOTAL) INTO THE MUSCLE EVERY 14 (FOURTEEN) DAYS.   ferrous sulfate 325 (65 FE) MG tablet Take 325 mg by mouth in the morning.   gabapentin  300 MG capsule Commonly known as: NEURONTIN  TAKE 1 CAPSULE BY MOUTH NIGHTLY AT  BEDTIME FOR 7 DAYS, INCREASE TO 2 CAPS AT BEDTIME FOR NEUROPATHY, APPT TIME AND LABS PLEASE What changed: See the new instructions.   lansoprazole  30 MG capsule Commonly known as: PREVACID  Take 1 capsule (30 mg total) by mouth daily at 12 noon.   levothyroxine  125 MCG tablet Commonly known as: SYNTHROID  TAKE 1 TABLET BY MOUTH DAILY BEFORE BREAKFAST.   linaclotide  145 MCG Caps capsule Commonly known as: Linzess  Take 1 capsule (145 mcg total) by mouth daily before breakfast.   nystatin  100000 UNIT/ML suspension Commonly known as: MYCOSTATIN  Take 5 mLs (500,000 Units total) by mouth 4 (four) times daily.   ondansetron  4 MG tablet Commonly known as: Zofran  Take 1 tablet (4 mg total) by mouth  every 8 (eight) hours as needed for nausea or vomiting.   oxyCODONE -acetaminophen  5-325 MG tablet Commonly known as: Percocet Take 1-2 tablets by mouth every 4 (four) hours as needed for severe pain (pain score 7-10).   rosuvastatin  5 MG tablet Commonly known as: Crestor  Take 1 tablet (5 mg total) by mouth daily. What changed: when to take this   SYRINGE 3CC/20GX1 20G X 1 3 ML Misc 1 each by Does not apply route every 14 (fourteen) days. For use with B12 injections   Vitamin D3 50 MCG (2000 UT) Tabs Take 2,000 Units by mouth in the morning.   Wegovy  1 MG/0.5ML Soaj Generic drug: Semaglutide -Weight Management INJECT 1MG  INTO THE SKIN ONCE A WEEK What changed: See the new instructions.        Follow-up Information     Terryl Arthea SAUNDERS, PA-C Follow up on 07/20/2023.   Specialty: Physician Assistant Contact information: [address] [place] [address] [phone]                  Laneta Luna, MD FACS

## 2023-07-18 ENCOUNTER — Other Ambulatory Visit: Payer: Self-pay

## 2023-07-18 ENCOUNTER — Encounter: Payer: Self-pay | Admitting: Surgery

## 2023-07-19 LAB — SURGICAL PATHOLOGY

## 2023-07-20 ENCOUNTER — Ambulatory Visit (INDEPENDENT_AMBULATORY_CARE_PROVIDER_SITE_OTHER): Admitting: Physician Assistant

## 2023-07-20 ENCOUNTER — Encounter: Payer: Self-pay | Admitting: Physician Assistant

## 2023-07-20 VITALS — BP 110/75 | HR 108 | Temp 98.2°F | Ht 66.0 in | Wt 225.0 lb

## 2023-07-20 DIAGNOSIS — Z09 Encounter for follow-up examination after completed treatment for conditions other than malignant neoplasm: Secondary | ICD-10-CM

## 2023-07-20 DIAGNOSIS — K436 Other and unspecified ventral hernia with obstruction, without gangrene: Secondary | ICD-10-CM

## 2023-07-20 DIAGNOSIS — K430 Incisional hernia with obstruction, without gangrene: Secondary | ICD-10-CM

## 2023-07-20 NOTE — Patient Instructions (Signed)
 Laparoscopic Surgery for Belly Hernias: What to Know After After the procedure, it's common to have pain, discomfort, or soreness. Follow these instructions at home: Medicines Take your medicines only as told. You may need to take steps to help treat or prevent trouble pooping (constipation), such as: Taking medicines to help you poop. Eating foods high in fiber, like beans, whole grains, and fresh fruits and vegetables. Drinking more fluids as told. Ask your health care provider if it's safe to drive or use machines while taking your medicine. Incision care  Take care of the cuts in your belly as told. Make sure you: Wash your hands with soap and water for at least 20 seconds before and after you change your bandage. If you can't use soap and water, use hand sanitizer. Change your bandage. Leave stitches or skin glue alone. Leave tape strips alone unless you're told to take them off. You may trim the edges of the tape strips if they curl up. Check the cuts on your belly every day for signs of infection. Check for: More redness, swelling, or pain. More fluid or blood. Warmth. Pus or a bad smell. Activity Rest as told. Get up to take short walks at least every 2 hours during the day. This helps you breathe better and keeps your blood flowing. Ask for help if you feel weak or unsteady. Do not take baths, swim, or use a hot tub until you're told it's OK. Ask if you can shower. Ask if it's OK for you to lift. If you were given a sedative, do not drive or use machines until you're told it's safe. A sedative can make you sleepy. Ask what things are safe for you to do at home. Ask when you can go back to work or school. General instructions Hold a pillow over your belly when you cough or sneeze. This helps with pain. Wear a binder around your belly as told by your provider. Do not smoke, vape, or use nicotine or tobacco. Wear compression stockings to reduce swelling and help prevent blood  clots in your legs. You may be asked to continue to do deep breathing exercises at home. This will help to prevent a lung infection. Contact a health care provider if: You have any signs of infection. You have pain that gets worse or does not get better with medicine. You throw up or you feel like throwing up. You have a cough. You have not pooped in 3 days. You are not able to pee. You have a fever. Get help right away if: You have very bad pain in your belly. You throw up every time you eat or drink. You have redness, warmth, or pain in your leg. You have chest pain. You have trouble breathing. These symptoms may be an emergency. Call 911 right away. Do not wait to see if the symptoms will go away. Do not drive yourself to the hospital. This information is not intended to replace advice given to you by your health care provider. Make sure you discuss any questions you have with your health care provider. Document Revised: 07/06/2022 Document Reviewed: 07/06/2022 Elsevier Patient Education  2024 [employer].

## 2023-07-20 NOTE — Progress Notes (Signed)
 [facility] POST-OP OFFICE VISIT  07/20/2023  HPI: Karen Hill is a 48 y.o. female 6 days s/p attempted robotic assisted ventral hernia repair converted to open abdominal wall reconstruction with TAR release, placement of Prevena, with Dr Jordis   She is doing well given the magnitude of surgery She denied any significant abdominal pain, mainly chronic back pain No fever, chills, emesis She is tolerating PO; bowel function normal No issues with Prevena Drains bilateral lower quadrants; serous, low output No other complaints   Vital signs: BP 110/75   Pulse (!) 108   Temp 98.2 F (36.8 C) (Oral)   Ht 5' 6 (1.676 m)   Wt 225 lb (102.1 kg)   LMP 04/26/2023   SpO2 96%   BMI 36.32 kg/m    Physical Exam: Constitutional: Well appearing female, NAD Abdomen: Soft, non-tender, non-distended, no rebound/guarding. She has drains in both RLQ and LLQ; these are both serous (removed) Skin: Prevena removed. Laparotomy is CDI with staples, no erythema  Assessment/Plan: This is a 48 y.o. female 6 days s/p attempted robotic assisted ventral hernia repair converted to open abdominal wall reconstruction with TAR release, placement of Prevena, with Dr Jordis    [place] Snooks removed. Staples intact, will leave these for another week  - Drains removed x2 without issue; dressing placed. Care reviewed  - Pain control prn  - Reviewed wound care recommendation  - Reviewed lifting restrictions; 6 weeks total minimal  - She will follow up on 07/16 for staple removal; She understands to call with questions/concerns in the interim  -- Arthea Platt, PA-C [facility] 07/20/2023, 2:46 PM M-F: 7am - 4pm

## 2023-07-22 NOTE — Anesthesia Postprocedure Evaluation (Signed)
 Anesthesia Post Note  Patient: Karen Hill  Procedure(s) Performed: REPAIR, HERNIA, VENTRAL, ROBOT-ASSISTED CONVERTED TO OPEN PROCEDURE INSERTION OF MESH  Patient location during evaluation: PACU Anesthesia Type: General Level of consciousness: awake and awake and alert Pain management: pain level controlled Vital Signs Assessment: post-procedure vital signs reviewed and stable Respiratory status: nonlabored ventilation Cardiovascular status: stable Anesthetic complications: no   No notable events documented.   Last Vitals:  Vitals:   07/16/23 0351 07/16/23 0741  BP: 111/68 (!) 116/50  Pulse: 92 96  Resp: 20 16  Temp: 36.6 C 37.1 C  SpO2: 94% 93%    Last Pain:  Vitals:   07/16/23 1203  TempSrc:   PainSc: 2                  VAN STAVEREN,Mousa Prout

## 2023-07-27 ENCOUNTER — Ambulatory Visit (INDEPENDENT_AMBULATORY_CARE_PROVIDER_SITE_OTHER): Admitting: Surgery

## 2023-07-27 ENCOUNTER — Encounter: Payer: Self-pay | Admitting: Surgery

## 2023-07-27 VITALS — BP 96/64 | HR 96 | Temp 97.9°F | Ht 66.0 in | Wt 226.0 lb

## 2023-07-27 DIAGNOSIS — K430 Incisional hernia with obstruction, without gangrene: Secondary | ICD-10-CM

## 2023-07-27 DIAGNOSIS — Z09 Encounter for follow-up examination after completed treatment for conditions other than malignant neoplasm: Secondary | ICD-10-CM

## 2023-07-27 DIAGNOSIS — Z8719 Personal history of other diseases of the digestive system: Secondary | ICD-10-CM

## 2023-07-27 MED ORDER — DOXYCYCLINE HYCLATE 100 MG PO CAPS: 200.0000 mg | ORAL_CAPSULE | Freq: Two times a day (BID) | ORAL | 0 refills | Status: AC

## 2023-07-27 NOTE — Patient Instructions (Addendum)
 Change your dressing daily, we will see you on Friday for a nurse visit to change your dressing, if you need us  to do it other days please call the office.     GENERAL POST-OPERATIVE PATIENT INSTRUCTIONS   WOUND CARE INSTRUCTIONS:  Keep a dry clean dressing on the wound if there is drainage. The initial bandage may be removed after 24 hours.  Once the wound has quit draining you may leave it open to air.  If clothing rubs against the wound or causes irritation and the wound is not draining you may cover it with a dry dressing during the daytime.  Try to keep the wound dry and avoid ointments on the wound unless directed to do so.  If the wound becomes bright red and painful or starts to drain infected material that is not clear, please contact your physician immediately.  If the wound is mildly pink and has a thick firm ridge underneath it, this is normal, and is referred to as a healing ridge.  This will resolve over the next 4-6 weeks.  BATHING: You may shower if you have been informed of this by your surgeon. However, Please do not submerge in a tub, hot tub, or pool until incisions are completely sealed or have been told by your surgeon that you may do so.  DIET:  You may eat any foods that you can tolerate.  It is a good idea to eat a high fiber diet and take in plenty of fluids to prevent constipation.  If you do become constipated you may want to take a mild laxative or take ducolax tablets on a daily basis until your bowel habits are regular.  Constipation can be very uncomfortable, along with straining, after recent surgery.  ACTIVITY:  You are encouraged to cough and deep breath or use your incentive spirometer if you were given one, every 15-30 minutes when awake.  This will help prevent respiratory complications and low grade fevers post-operatively if you had a general anesthetic.  You may want to hug a pillow when coughing and sneezing to add additional support to the surgical area, if  you had abdominal or chest surgery, which will decrease pain during these times.  You are encouraged to walk and engage in light activity for the next two weeks.  You should not lift more than 20 pounds for 6 weeks total after surgery as it could put you at increased risk for complications.  Twenty pounds is roughly equivalent to a plastic bag of groceries. At that time- Listen to your body when lifting, if you have pain when lifting, stop and then try again in a few days. Soreness after doing exercises or activities of daily living is normal as you get back in to your normal routine.  MEDICATIONS:  Try to take narcotic medications and anti-inflammatory medications, such as tylenol , ibuprofen, naprosyn, etc., with food.  This will minimize stomach upset from the medication.  Should you develop nausea and vomiting from the pain medication, or develop a rash, please discontinue the medication and contact your physician.  You should not drive, make important decisions, or operate machinery when taking narcotic pain medication.  SUNBLOCK Use sun block to incision area over the next year if this area will be exposed to sun. This helps decrease scarring and will allow you avoid a permanent darkened area over your incision.  QUESTIONS:  Please feel free to call our office if you have any questions, and we will be glad  to assist you. [phone]

## 2023-07-27 NOTE — Progress Notes (Signed)
 Karen Hill is 2 weeks from open abdominal wall reconstruction.  She is doing well but has noticed some opening of her wound.  She does have significant overweight issues smoking and also lymphedema.  Fevers no chills.  She has a daughter that has autism and was recently diagnosed with type 1 diabetes requiring ICU hospitalization. Her drains are out  PE NAD Abd: soft, there is evidence of skin excoriations due to the tape, central portion 4 cm w superficial dehiscence, no infection, afew staples removed and dry dressing placed. No peritonitis  A/P Superficial dehiscence of central portion of wound, daily dressing changes Antibiotics to prevent any contamination No need for hospitalizatio

## 2023-07-29 ENCOUNTER — Ambulatory Visit

## 2023-08-03 ENCOUNTER — Encounter: Payer: Self-pay | Admitting: Surgery

## 2023-08-03 ENCOUNTER — Ambulatory Visit: Admitting: Surgery

## 2023-08-03 VITALS — BP 97/66 | HR 105 | Temp 99.2°F | Ht 66.0 in | Wt 225.8 lb

## 2023-08-03 DIAGNOSIS — K430 Incisional hernia with obstruction, without gangrene: Secondary | ICD-10-CM

## 2023-08-03 DIAGNOSIS — Z09 Encounter for follow-up examination after completed treatment for conditions other than malignant neoplasm: Secondary | ICD-10-CM

## 2023-08-03 NOTE — Patient Instructions (Signed)

## 2023-08-04 NOTE — Progress Notes (Unsigned)
 Outpatient Surgical Follow Up  08/04/2023  Karen Hill is an 48 y.o. female.   Chief Complaint  Patient presents with   Routine Post Op    Ventral hernia repair 07/14/23    HPI: she is doing ok, tolerating po no fevers, still persistent draiange from wound  Past Medical History:  Diagnosis Date   Dyspnea    GERD (gastroesophageal reflux disease)    Hypothyroidism    Liver hemangioma    Lymphedema    Sleep apnea    no cpap   Thyroid disease     Past Surgical History:  Procedure Laterality Date   CESAREAN SECTION  2014   CHOLECYSTECTOMY  2005   COLONOSCOPY WITH PROPOFOL  N/A 03/09/2023   Procedure: COLONOSCOPY WITH PROPOFOL ;  Surgeon: Therisa Bi, MD;  Location: [facility] ENDOSCOPY;  Service: Gastroenterology;  Laterality: N/A;   INSERTION OF MESH  07/14/2023   Procedure: INSERTION OF MESH;  Surgeon: Jordis Laneta FALCON, MD;  Location: [facility] ORS;  Service: General;;   MANDIBLE FRACTURE SURGERY     MULTIPLE TOOTH EXTRACTIONS     TUBAL LIGATION  2014   XI ROBOTIC ASSISTED VENTRAL HERNIA N/A 07/14/2023   Procedure: REPAIR, HERNIA, VENTRAL, ROBOT-ASSISTED CONVERTED TO OPEN PROCEDURE;  Surgeon: Jordis Laneta FALCON, MD;  Location: [facility] ORS;  Service: General;  Laterality: N/A;    Family History  Problem Relation Age of Onset   Diabetes Mother    Hypertension Father    Breast cancer Neg Hx     Social History:  reports that she has been smoking cigarettes. She has a 10 pack-year smoking history. She has been exposed to tobacco smoke. She has never used smokeless tobacco. She reports current alcohol use. She reports that she does not use drugs.  Allergies:  Allergies  Allergen Reactions   Penicillin G Hives and Itching   Amoxicillin    Shellfish Allergy Nausea And Vomiting    Medications reviewed.    ROS Full ROS performed and is otherwise negative other than what is stated in HPI   BP 97/66   Pulse (!) 105   Temp 99.2 F (37.3 C) (Oral)   Ht 5' 6 (1.676 m)   Wt 225 lb 12.8  oz (102.4 kg)   LMP 04/26/2023   SpO2 94%   BMI 36.45 kg/m   Physical Exam   NAd alert Jai:[id number] healing well, open wound middle w good granulation tissue, no evidence of infection, rest of the staples removed   Assessment/Plan: Doing well, continue dressing changes, if prolonged wound healing may need vac, we will make determination at next appt  Laneta Jordis, MD [facility] General Surgeon

## 2023-08-05 ENCOUNTER — Ambulatory Visit

## 2023-08-07 NOTE — Progress Notes (Signed)
 Referring Physician:  Orlean Alan HERO, FNP [address] [place],  [place] [address]  Primary Physician:  Orlean Alan HERO, FNP  History of Present Illness: [name] has a history of hypothyroidism, prediabetes, lymphedema of lower extremities, and obesity.   Last seen by me on 03/16/23 for for right sided back and leg pain. She has known DDD L4-S1, worse at L5-S1 with slight slip. Also with mild bilateral foraminal stenosis L4-L5 and right disc L5-S1 that compresses S1 nerve with mild right and mild/moderate left foraminal stenosis.   She started PT at [facility] on 05/25/23. She was discharged on 07/07/23 due to upcoming surgery.   She has repair of recurrent ventral hernia on 07/14/23 by Dr. Jordis.   She is here for follow up.   No improvement at all with PT. She continues with constant right sided LBP with right lateral/posterior leg pain to her foot. No left leg pain. She has numbness and tingling in her right foot. Pain is worse with bending and staying in any prolonged position.   She was 233 at last visit, she has lost 6 more pounds.   She is taking baclofen  and neurontin . These help very little.   She smokes 2 PPD x 15 years. She is working on quitting- it is not going well.   Bowel/Bladder Dysfunction: none  Conservative measures:  Physical therapy: has not participated in PT Multimodal medical therapy including regular antiinflammatories:  Gabapentin , Prednisone  Injections: no epidural steroid injections  Past Surgery: no spinal surgeries  Karen Hill has no symptoms of cervical myelopathy.  The symptoms are causing a significant impact on the patient's life.   Review of Systems:  A 10 point review of systems is negative, except for the pertinent positives and negatives detailed in the HPI.  Past Medical History: Past Medical History:  Diagnosis Date   Dyspnea    GERD (gastroesophageal reflux disease)    Hypothyroidism    Liver hemangioma     Lymphedema    Sleep apnea    no cpap   Thyroid disease     Past Surgical History: Past Surgical History:  Procedure Laterality Date   CESAREAN SECTION  2014   CHOLECYSTECTOMY  2005   COLONOSCOPY WITH PROPOFOL  N/A 03/09/2023   Procedure: COLONOSCOPY WITH PROPOFOL ;  Surgeon: Therisa Bi, MD;  Location: [facility] ENDOSCOPY;  Service: Gastroenterology;  Laterality: N/A;   INSERTION OF MESH  07/14/2023   Procedure: INSERTION OF MESH;  Surgeon: Jordis Laneta FALCON, MD;  Location: [facility] ORS;  Service: General;;   MANDIBLE FRACTURE SURGERY     MULTIPLE TOOTH EXTRACTIONS     TUBAL LIGATION  2014   XI ROBOTIC ASSISTED VENTRAL HERNIA N/A 07/14/2023   Procedure: REPAIR, HERNIA, VENTRAL, ROBOT-ASSISTED CONVERTED TO OPEN PROCEDURE;  Surgeon: Jordis Laneta FALCON, MD;  Location: [facility] ORS;  Service: General;  Laterality: N/A;    Allergies: Allergies as of 08/17/2023 - Review Complete 08/03/2023  Allergen Reaction Noted   Penicillin g Hives and Itching 11/08/2013   Amoxicillin  04/01/2022   Shellfish allergy Nausea And Vomiting 03/12/2016    Medications: Outpatient Encounter Medications as of 08/17/2023  Medication Sig   albuterol  (PROAIR  HFA) 108 (90 Base) MCG/ACT inhaler Inhale 2 puffs into the lungs every 6 (six) hours as needed for wheezing or shortness of breath.   baclofen  (LIORESAL ) 10 MG tablet TAKE 1 TABLET BY MOUTH TWICE A DAY AS NEEDED FOR MUSCLE SPASMS (Patient taking differently: Take 20 mg by mouth at bedtime.)  Cholecalciferol (VITAMIN D3) 50 MCG (2000 UT) TABS Take 2,000 Units by mouth in the morning.   cyanocobalamin  (VITAMIN B12) 1000 MCG/ML injection INJECT 1 ML (1,000 MCG TOTAL) INTO THE MUSCLE EVERY 14 (FOURTEEN) DAYS.   ferrous sulfate 325 (65 FE) MG tablet Take 325 mg by mouth in the morning.   gabapentin  (NEURONTIN ) 300 MG capsule TAKE 1 CAPSULE BY MOUTH NIGHTLY AT BEDTIME FOR 7 DAYS, INCREASE TO 2 CAPS AT BEDTIME FOR NEUROPATHY, APPT TIME AND LABS PLEASE (Patient taking differently: Take 600  mg by mouth at bedtime.)   lansoprazole  (PREVACID ) 30 MG capsule Take 1 capsule (30 mg total) by mouth daily at 12 noon.   levothyroxine  (SYNTHROID ) 125 MCG tablet TAKE 1 TABLET BY MOUTH DAILY BEFORE BREAKFAST.   linaclotide  (LINZESS ) 145 MCG CAPS capsule Take 1 capsule (145 mcg total) by mouth daily before breakfast.   nystatin  (MYCOSTATIN ) 100000 UNIT/ML suspension Take 5 mLs (500,000 Units total) by mouth 4 (four) times daily.   ondansetron  (ZOFRAN ) 4 MG tablet Take 1 tablet (4 mg total) by mouth every 8 (eight) hours as needed for nausea or vomiting.   oxyCODONE -acetaminophen  (PERCOCET) 5-325 MG tablet Take 1-2 tablets by mouth every 4 (four) hours as needed for severe pain (pain score 7-10).   rosuvastatin  (CRESTOR ) 5 MG tablet Take 1 tablet (5 mg total) by mouth daily. (Patient taking differently: Take 5 mg by mouth every morning.)   Semaglutide -Weight Management (WEGOVY ) 1 MG/0.5ML SOAJ INJECT 1MG  INTO THE SKIN ONCE A WEEK (Patient taking differently: Inject 1 mg into the skin every Saturday.)   Syringe/Needle, Disp, (SYRINGE 3CC/20GX1) 20G X 1 3 ML MISC 1 each by Does not apply route every 14 (fourteen) days. For use with B12 injections   No facility-administered encounter medications on file as of 08/17/2023.    Social History: Social History   Tobacco Use   Smoking status: Every Day    Current packs/day: 1.00    Average packs/day: 1 pack/day for 10.0 years (10.0 ttl pk-yrs)    Types: Cigarettes    Passive exposure: Past   Smokeless tobacco: Never  Vaping Use   Vaping status: Former  Substance Use Topics   Alcohol use: Yes    Comment: rare   Drug use: No    Family Medical History: Family History  Problem Relation Age of Onset   Diabetes Mother    Hypertension Father    Breast cancer Neg Hx     Physical Examination: There were no vitals filed for this visit.    Awake, alert, oriented to person, place, and time.  Speech is clear and fluent. Fund of knowledge is  appropriate.   Cranial Nerves: Pupils equal round and reactive to light.  Facial tone is symmetric.    She has right sided lower lumbar tenderness.   No abnormal lesions on exposed skin.   Strength: Side Iliopsoas Quads Hamstring PF DF EHL  R 5 5 5 5 5 5   L 5 5 5 5 5 5      Clonus is not present.   Bilateral lower extremity sensation is intact to light touch.     Has known lymphedema in both legs.   Gait is slow.   Medical Decision Making  Imaging: None   Assessment and Plan: She had no improvement with PT. She continues with constant right sided LBP with right lateral/posterior leg pain to her foot. No left leg pain. She has numbness and tingling in her right foot.   She has  known DDD L4-S1, worse at L5-S1 with slight slip. Alos with mild bilateral foraminal stenosis L4-L5 and right disc L5-S1 that compresses S1 nerve with mild right and mild/moderate left foraminal stenosis.   Treatment options discussed with patient and following plan made:   - Referral back to Dr. Marcelino to revisit injections. They were not approved previously as she did not complete PT.  - Smoking cessation discussed and encouraged. She is up to 2 PPD.  - She is doing great on weight loss. She will continue to work on this.  - She is not interested in any surgery options.  - She will follow up with me in 2 months and prn.   I spent a total of 25 minutes in face-to-face and non-face-to-face activities related to this patient's care today including review of outside records, review of imaging, review of symptoms, physical exam, discussion of differential diagnosis, discussion of treatment options, and documentation.   Glade Boys PA-C Dept. of Neurosurgery

## 2023-08-12 ENCOUNTER — Ambulatory Visit

## 2023-08-12 DIAGNOSIS — K430 Incisional hernia with obstruction, without gangrene: Secondary | ICD-10-CM

## 2023-08-12 DIAGNOSIS — Z09 Encounter for follow-up examination after completed treatment for conditions other than malignant neoplasm: Secondary | ICD-10-CM

## 2023-08-12 NOTE — Progress Notes (Signed)
 Pt came in for wound change. Clean area with Qtip. Bandaged wound with 4X4 gauze and ABD pad. Pt tolerated well.

## 2023-08-17 ENCOUNTER — Encounter: Payer: Self-pay | Admitting: Orthopedic Surgery

## 2023-08-17 ENCOUNTER — Ambulatory Visit (INDEPENDENT_AMBULATORY_CARE_PROVIDER_SITE_OTHER): Admitting: Orthopedic Surgery

## 2023-08-17 ENCOUNTER — Encounter: Payer: Self-pay | Admitting: Surgery

## 2023-08-17 ENCOUNTER — Ambulatory Visit (INDEPENDENT_AMBULATORY_CARE_PROVIDER_SITE_OTHER): Admitting: Surgery

## 2023-08-17 VITALS — BP 115/76 | HR 78 | Ht 66.0 in | Wt 226.0 lb

## 2023-08-17 VITALS — BP 92/62 | Ht 66.0 in | Wt 227.6 lb

## 2023-08-17 DIAGNOSIS — M47816 Spondylosis without myelopathy or radiculopathy, lumbar region: Secondary | ICD-10-CM

## 2023-08-17 DIAGNOSIS — M51362 Other intervertebral disc degeneration, lumbar region with discogenic back pain and lower extremity pain: Secondary | ICD-10-CM | POA: Diagnosis not present

## 2023-08-17 DIAGNOSIS — M4726 Other spondylosis with radiculopathy, lumbar region: Secondary | ICD-10-CM

## 2023-08-17 DIAGNOSIS — M48061 Spinal stenosis, lumbar region without neurogenic claudication: Secondary | ICD-10-CM | POA: Diagnosis not present

## 2023-08-17 DIAGNOSIS — K430 Incisional hernia with obstruction, without gangrene: Secondary | ICD-10-CM

## 2023-08-17 DIAGNOSIS — M5416 Radiculopathy, lumbar region: Secondary | ICD-10-CM

## 2023-08-17 DIAGNOSIS — Z09 Encounter for follow-up examination after completed treatment for conditions other than malignant neoplasm: Secondary | ICD-10-CM

## 2023-08-17 NOTE — Progress Notes (Signed)
 Outpatient Surgical Follow Up  08/17/2023  Karen Hill is an 48 y.o. female.   Chief Complaint  Patient presents with   Routine Post Op    HPI: 5 weeks out from abd wall reconstruction. Doing well, she did have mild dehiscence of wound. SHe continues to smoke. No fevers, no abd pain, tolerating diet  Past Medical History:  Diagnosis Date   Dyspnea    GERD (gastroesophageal reflux disease)    Hypothyroidism    Liver hemangioma    Lymphedema    Sleep apnea    no cpap   Thyroid disease     Past Surgical History:  Procedure Laterality Date   CESAREAN SECTION  2014   CHOLECYSTECTOMY  2005   COLONOSCOPY WITH PROPOFOL  N/A 03/09/2023   Procedure: COLONOSCOPY WITH PROPOFOL ;  Surgeon: Therisa Bi, MD;  Location: [facility] ENDOSCOPY;  Service: Gastroenterology;  Laterality: N/A;   INSERTION OF MESH  07/14/2023   Procedure: INSERTION OF MESH;  Surgeon: Jordis Laneta FALCON, MD;  Location: [facility] ORS;  Service: General;;   MANDIBLE FRACTURE SURGERY     MULTIPLE TOOTH EXTRACTIONS     TUBAL LIGATION  2014   XI ROBOTIC ASSISTED VENTRAL HERNIA N/A 07/14/2023   Procedure: REPAIR, HERNIA, VENTRAL, ROBOT-ASSISTED CONVERTED TO OPEN PROCEDURE;  Surgeon: Jordis Laneta FALCON, MD;  Location: [facility] ORS;  Service: General;  Laterality: N/A;    Family History  Problem Relation Age of Onset   Diabetes Mother    Hypertension Father    Breast cancer Neg Hx     Social History:  reports that she has been smoking cigarettes. She has a 10 pack-year smoking history. She has been exposed to tobacco smoke. She has never used smokeless tobacco. She reports current alcohol use. She reports that she does not use drugs.  Allergies:  Allergies  Allergen Reactions   Penicillin G Hives and Itching   Amoxicillin    Shellfish Allergy Nausea And Vomiting    Medications reviewed.    ROS Full ROS performed and is otherwise negative other than what is stated in HPI   BP 115/76   Pulse 78   Ht 5' 6 (1.676 m)   Wt 226 lb  (102.5 kg)   SpO2 97%   BMI 36.48 kg/m   Physical Exam   NAd alert Jai:[id number] healing well, two small open wounds middle w good granulation tissue,  measuring 1.5 cms, no evidence of infection, skin in better health.   Assessment/Plan:  Doing well continue dressing changes no need for wound vac or antimitotics     Laneta Jordis, MD [facility] General Surgeon

## 2023-08-17 NOTE — Patient Instructions (Addendum)
 Shower daily, remove your dressing first and let the warm soapy water run over the area. Rinse well and pat dry and place clean dry gauze and tape in place.  Follow-up with our office in one month.  Please call and ask to speak with a nurse if you develop questions or concerns.   GENERAL POST-OPERATIVE PATIENT INSTRUCTIONS   WOUND CARE INSTRUCTIONS:  Keep a dry clean dressing on the wound if there is drainage. The initial bandage may be removed after 24 hours.  Once the wound has quit draining you may leave it open to air.  If clothing rubs against the wound or causes irritation and the wound is not draining you may cover it with a dry dressing during the daytime.  Try to keep the wound dry and avoid ointments on the wound unless directed to do so.  If the wound becomes bright red and painful or starts to drain infected material that is not clear, please contact your physician immediately.  If the wound is mildly pink and has a thick firm ridge underneath it, this is normal, and is referred to as a healing ridge.  This will resolve over the next 4-6 weeks.  BATHING: You may shower if you have been informed of this by your surgeon. However, Please do not submerge in a tub, hot tub, or pool until incisions are completely sealed or have been told by your surgeon that you may do so.  DIET:  You may eat any foods that you can tolerate.  It is a good idea to eat a high fiber diet and take in plenty of fluids to prevent constipation.  If you do become constipated you may want to take a mild laxative or take ducolax tablets on a daily basis until your bowel habits are regular.  Constipation can be very uncomfortable, along with straining, after recent surgery.  ACTIVITY:  You are encouraged to cough and deep breath or use your incentive spirometer if you were given one, every 15-30 minutes when awake.  This will help prevent respiratory complications and low grade fevers post-operatively if you had a  general anesthetic.  You may want to hug a pillow when coughing and sneezing to add additional support to the surgical area, if you had abdominal or chest surgery, which will decrease pain during these times.  You are encouraged to walk and engage in light activity for the next two weeks.  You should not lift more than 20 pounds for 6 weeks total after surgery as it could put you at increased risk for complications.  Twenty pounds is roughly equivalent to a plastic bag of groceries. At that time- Listen to your body when lifting, if you have pain when lifting, stop and then try again in a few days. Soreness after doing exercises or activities of daily living is normal as you get back in to your normal routine.  MEDICATIONS:  Try to take narcotic medications and anti-inflammatory medications, such as tylenol , ibuprofen, naprosyn, etc., with food.  This will minimize stomach upset from the medication.  Should you develop nausea and vomiting from the pain medication, or develop a rash, please discontinue the medication and contact your physician.  You should not drive, make important decisions, or operate machinery when taking narcotic pain medication.  SUNBLOCK Use sun block to incision area over the next year if this area will be exposed to sun. This helps decrease scarring and will allow you avoid a permanent darkened area over your  incision.  QUESTIONS:  Please feel free to call our office if you have any questions, and we will be glad to assist you. [phone]

## 2023-08-18 NOTE — Progress Notes (Deleted)
 Ellouise Console, PA-C [address] [place], [place]  [address] Phone: [phone]   Primary Care Physician: Orlean Alan HERO, FNP  Primary Gastroenterologist:  Ellouise Console, PA-C / Glendia Holt, MD   Chief Complaint: F/U nausea, vomiting, large ventral hernia, GERD, gastritis, chronic constipation.   HPI:   Karen Hill is a 48 y.o. female returns for 6-week follow-up of nausea, vomiting, large ventral hernia, GERD, gastritis, chronic constipation, dark stools, hypokalemia.  6 weeks ago she was continued on Prevacid  30 Mg daily.  Advised to decrease or hold Wegovy  to see if nausea and vomiting improved.  Was switched from Amitiza  to Linzess  145 mcg daily.  She underwent surgery by Dr. Jordis 07/14/2023 to repair large ventral hernia.  She had mild dehiscence of her wound after surgery.  Currently 5 weeks out and is healing well.  She continues to smoke.  Had follow-up with surgeon yesterday, doing well.  07/08/2023 labs: Normal CBC (Hgb 14.6).  Normal BMP.  Potassium improved to normal 3.8.  Hemoccult cards not completed.  Current Symptoms:  03/09/2023 colonoscopy by Dr. Therisa: Fair prep.  Otherwise normal.  Could not reach the cecum due to herniated colon and loops through abdominal wall.  No polyps.  No biopsies.  Virtual colonoscopy was recommended due to incomplete colonoscopy.  Consider repeat colonoscopy after repair of the hernia.  Virtual CT colonoscopy was ordered, yet not completed.   No Previous EGD.  Current Outpatient Medications  Medication Sig Dispense Refill   albuterol  (PROAIR  HFA) 108 (90 Base) MCG/ACT inhaler Inhale 2 puffs into the lungs every 6 (six) hours as needed for wheezing or shortness of breath. 108 each 3   baclofen  (LIORESAL ) 10 MG tablet TAKE 1 TABLET BY MOUTH TWICE A DAY AS NEEDED FOR MUSCLE SPASMS (Patient taking differently: Take 20 mg by mouth at bedtime.) 180 tablet 1   Cholecalciferol (VITAMIN D3) 50 MCG (2000 UT) TABS Take 2,000 Units  by mouth in the morning.     cyanocobalamin  (VITAMIN B12) 1000 MCG/ML injection INJECT 1 ML (1,000 MCG TOTAL) INTO THE MUSCLE EVERY 14 (FOURTEEN) DAYS. 6 mL 0   ferrous sulfate 325 (65 FE) MG tablet Take 325 mg by mouth in the morning.     gabapentin  (NEURONTIN ) 300 MG capsule TAKE 1 CAPSULE BY MOUTH NIGHTLY AT BEDTIME FOR 7 DAYS, INCREASE TO 2 CAPS AT BEDTIME FOR NEUROPATHY, APPT TIME AND LABS PLEASE (Patient taking differently: Take 600 mg by mouth at bedtime.) 60 capsule 6   lansoprazole  (PREVACID ) 30 MG capsule Take 1 capsule (30 mg total) by mouth daily at 12 noon. 90 capsule 3   levothyroxine  (SYNTHROID ) 125 MCG tablet TAKE 1 TABLET BY MOUTH DAILY BEFORE BREAKFAST. 90 tablet 1   linaclotide  (LINZESS ) 145 MCG CAPS capsule Take 1 capsule (145 mcg total) by mouth daily before breakfast. 30 capsule 5   rosuvastatin  (CRESTOR ) 5 MG tablet Take 1 tablet (5 mg total) by mouth daily. (Patient taking differently: Take 5 mg by mouth every morning.) 90 tablet 0   Semaglutide -Weight Management (WEGOVY ) 1 MG/0.5ML SOAJ INJECT 1MG  INTO THE SKIN ONCE A WEEK (Patient taking differently: Inject 1 mg into the skin every Saturday.) 2 mL 1   Syringe/Needle, Disp, (SYRINGE 3CC/20GX1) 20G X 1 3 ML MISC 1 each by Does not apply route every 14 (fourteen) days. For use with B12 injections 6 each 3   No current facility-administered medications for this visit.    Allergies as of 08/19/2023 -  Review Complete 08/17/2023  Allergen Reaction Noted   Penicillin g Hives and Itching 11/08/2013   Amoxicillin  04/01/2022   Shellfish allergy Nausea And Vomiting 03/12/2016    Past Medical History:  Diagnosis Date   Dyspnea    GERD (gastroesophageal reflux disease)    Hypothyroidism    Liver hemangioma    Lymphedema    Sleep apnea    no cpap   Thyroid disease     Past Surgical History:  Procedure Laterality Date   CESAREAN SECTION  2014   CHOLECYSTECTOMY  2005   COLONOSCOPY WITH PROPOFOL  N/A 03/09/2023    Procedure: COLONOSCOPY WITH PROPOFOL ;  Surgeon: Therisa Bi, MD;  Location: [facility] ENDOSCOPY;  Service: Gastroenterology;  Laterality: N/A;   INSERTION OF MESH  07/14/2023   Procedure: INSERTION OF MESH;  Surgeon: Jordis Laneta FALCON, MD;  Location: [facility] ORS;  Service: General;;   MANDIBLE FRACTURE SURGERY     MULTIPLE TOOTH EXTRACTIONS     TUBAL LIGATION  2014   XI ROBOTIC ASSISTED VENTRAL HERNIA N/A 07/14/2023   Procedure: REPAIR, HERNIA, VENTRAL, ROBOT-ASSISTED CONVERTED TO OPEN PROCEDURE;  Surgeon: Jordis Laneta FALCON, MD;  Location: [facility] ORS;  Service: General;  Laterality: N/A;    Review of Systems:    All systems reviewed and negative except where noted in HPI.    Physical Exam:  There were no vitals taken for this visit. No LMP recorded.  General: Well-nourished, well-developed in no acute distress.  Lungs: Clear to auscultation bilaterally. Non-labored. Heart: Regular rate and rhythm, no murmurs rubs or gallops.  Abdomen: Bowel sounds are normal; Abdomen is Soft; No hepatosplenomegaly, masses or hernias;  No Abdominal Tenderness; No guarding or rebound tenderness. Neuro: Alert and oriented x 3.  Grossly intact.  Psych: Alert and cooperative, normal mood and affect.   Imaging Studies: No results found.  Labs: CBC    Component Value Date/Time   WBC 13.8 (H) 07/15/2023 0546   RBC 3.98 07/15/2023 0546   HGB 12.2 07/15/2023 0546   HGB 15.2 06/01/2023 1041   HCT 39.2 07/15/2023 0546   HCT 45.0 06/01/2023 1041   PLT 156 07/15/2023 0546   PLT 221 06/01/2023 1041   MCV 98.5 07/15/2023 0546   MCV 93 06/01/2023 1041   MCH 30.7 07/15/2023 0546   MCHC 31.1 07/15/2023 0546   RDW 12.9 07/15/2023 0546   RDW 12.4 06/01/2023 1041   LYMPHSABS 1.9 07/08/2023 1155   LYMPHSABS 2.2 06/01/2023 1041   MONOABS 0.3 07/08/2023 1155   EOSABS 0.1 07/08/2023 1155   EOSABS 0.1 06/01/2023 1041   BASOSABS 0.1 07/08/2023 1155   BASOSABS 0.0 06/01/2023 1041    CMP     Component Value Date/Time   NA  143 07/15/2023 0546   NA 140 06/01/2023 1041   K 4.2 07/15/2023 0546   CL 110 07/15/2023 0546   CO2 27 07/15/2023 0546   GLUCOSE 118 (H) 07/15/2023 0546   BUN 8 07/15/2023 0546   BUN 6 06/01/2023 1041   CREATININE 0.58 07/15/2023 0546   CALCIUM  8.8 (L) 07/15/2023 0546   PROT 5.1 (L) 07/15/2023 0546   PROT 6.6 06/01/2023 1041   ALBUMIN  2.7 (L) 07/15/2023 0546   ALBUMIN  3.8 (L) 06/01/2023 1041   AST 18 07/15/2023 0546   ALT 9 07/15/2023 0546   ALKPHOS 53 07/15/2023 0546   BILITOT 1.0 07/15/2023 0546   BILITOT 1.1 06/01/2023 1041   GFRNONAA >60 07/15/2023 0546       Assessment and Plan:  Karen Hill is a 48 y.o. y/o female   1.  Nausea and vomiting: Adverse SE of Wegovey? GERD / Gasttiris - EGD - Continue PPI Prevacid  30mg  daily - Check H. Pylori - Hold Wegovey  2.  Chronic constipation - Continue Linzess  145  3.  S/P large ventral hernia repair 07/14/2023 with Mesh insertion; currently well-healing  4.  Colon Cancer Screening: Incomplete Colonoscopy 02/2023 due to large ventral hernia - Repeat Colonoscopy after healed from hernia surgery or Virtual Colonography.  Ellouise Console, PA-C  Follow up ***

## 2023-08-19 ENCOUNTER — Ambulatory Visit: Admitting: Physician Assistant

## 2023-08-19 ENCOUNTER — Other Ambulatory Visit: Payer: Self-pay

## 2023-08-19 DIAGNOSIS — K5904 Chronic idiopathic constipation: Secondary | ICD-10-CM

## 2023-08-19 MED ORDER — LINACLOTIDE 145 MCG PO CAPS
145.0000 ug | ORAL_CAPSULE | Freq: Every day | ORAL | 3 refills | Status: AC
Start: 2023-08-19 — End: ?

## 2023-08-22 ENCOUNTER — Other Ambulatory Visit

## 2023-08-22 DIAGNOSIS — R195 Other fecal abnormalities: Secondary | ICD-10-CM | POA: Diagnosis not present

## 2023-08-22 LAB — HEMOCCULT SLIDES (X 3 CARDS)
Fecal Occult Blood: NEGATIVE
OCCULT 1: NEGATIVE
OCCULT 2: NEGATIVE
OCCULT 3: NEGATIVE
OCCULT 4: NEGATIVE
OCCULT 5: NEGATIVE

## 2023-08-29 ENCOUNTER — Other Ambulatory Visit: Payer: Self-pay | Admitting: Family

## 2023-09-01 ENCOUNTER — Ambulatory Visit: Admitting: Family

## 2023-09-13 ENCOUNTER — Other Ambulatory Visit: Payer: Self-pay | Admitting: Family

## 2023-09-15 ENCOUNTER — Ambulatory Visit: Admitting: Family

## 2023-09-15 ENCOUNTER — Encounter: Payer: Self-pay | Admitting: Family

## 2023-09-15 VITALS — BP 110/70 | HR 107 | Ht 66.0 in | Wt 225.8 lb

## 2023-09-15 DIAGNOSIS — E039 Hypothyroidism, unspecified: Secondary | ICD-10-CM

## 2023-09-15 DIAGNOSIS — E538 Deficiency of other specified B group vitamins: Secondary | ICD-10-CM

## 2023-09-15 DIAGNOSIS — E559 Vitamin D deficiency, unspecified: Secondary | ICD-10-CM

## 2023-09-15 DIAGNOSIS — R7303 Prediabetes: Secondary | ICD-10-CM | POA: Diagnosis not present

## 2023-09-15 DIAGNOSIS — E782 Mixed hyperlipidemia: Secondary | ICD-10-CM

## 2023-09-15 DIAGNOSIS — R5383 Other fatigue: Secondary | ICD-10-CM

## 2023-09-15 DIAGNOSIS — I890 Lymphedema, not elsewhere classified: Secondary | ICD-10-CM

## 2023-09-15 NOTE — Progress Notes (Signed)
 Established Patient Office Visit  Subjective:  Patient ID: Karen Hill, female    DOB: 1975-05-21  Age: 48 y.o. MRN: [id number]  Chief Complaint  Patient presents with   Follow-up    3 month follow up    Patient is here today for her 3 months follow up.  She has been feeling poorly since last appointment.   She does have additional concerns to discuss today.  Lymphedema - needs referral to V/V Had been off of her wegovy  and says that she started taking again but has had side effects.   Labs are due today.  She needs refills.   I have reviewed her active problem list, medication list, allergies, notes from last encounter, lab results for her appointment today.      No other concerns at this time.   Past Medical History:  Diagnosis Date   Dyspnea    GERD (gastroesophageal reflux disease)    Hypothyroidism    Liver hemangioma    Lymphedema    Sleep apnea    no cpap   Thyroid disease     Past Surgical History:  Procedure Laterality Date   CESAREAN SECTION  2014   CHOLECYSTECTOMY  2005   COLONOSCOPY WITH PROPOFOL  N/A 03/09/2023   Procedure: COLONOSCOPY WITH PROPOFOL ;  Surgeon: Therisa Bi, MD;  Location: [facility] ENDOSCOPY;  Service: Gastroenterology;  Laterality: N/A;   INSERTION OF MESH  07/14/2023   Procedure: INSERTION OF MESH;  Surgeon: Jordis Laneta FALCON, MD;  Location: [facility] ORS;  Service: General;;   MANDIBLE FRACTURE SURGERY     MULTIPLE TOOTH EXTRACTIONS     TUBAL LIGATION  2014   XI ROBOTIC ASSISTED VENTRAL HERNIA N/A 07/14/2023   Procedure: REPAIR, HERNIA, VENTRAL, ROBOT-ASSISTED CONVERTED TO OPEN PROCEDURE;  Surgeon: Jordis Laneta FALCON, MD;  Location: [facility] ORS;  Service: General;  Laterality: N/A;    Social History   Socioeconomic History   Marital status: Married    Spouse name: Not on file   Number of children: Not on file   Years of education: Not on file   Highest education level: Not on file  Occupational History   Not on file  Tobacco Use   Smoking  status: Every Day    Current packs/day: 1.00    Average packs/day: 1 pack/day for 10.0 years (10.0 ttl pk-yrs)    Types: Cigarettes    Passive exposure: Past   Smokeless tobacco: Never  Vaping Use   Vaping status: Former  Substance and Sexual Activity   Alcohol use: Yes    Comment: rare   Drug use: No   Sexual activity: Not on file  Other Topics Concern   Not on file  Social History Narrative   Not on file   Social Drivers of Health   Financial Resource Strain: Not on file  Food Insecurity: No Food Insecurity (07/16/2023)   Hunger Vital Sign    Worried About Running Out of Food in the Last Year: Never true    Ran Out of Food in the Last Year: Never true  Transportation Needs: No Transportation Needs (07/16/2023)   PRAPARE - Administrator, Civil Service (Medical): No    Lack of Transportation (Non-Medical): No  Physical Activity: Not on file  Stress: Not on file  Social Connections: Not on file  Intimate Partner Violence: Not At Risk (07/16/2023)   Humiliation, Afraid, Rape, and Kick questionnaire    Fear of Current or Ex-Partner: No    Emotionally Abused:  No    Physically Abused: No    Sexually Abused: No    Family History  Problem Relation Age of Onset   Diabetes Mother    Hypertension Father    Breast cancer Neg Hx     Allergies  Allergen Reactions   Penicillin G Hives and Itching   Amoxicillin    Shellfish Allergy Nausea And Vomiting    Review of Systems  All other systems reviewed and are negative.      Objective:   BP 110/70   Pulse (!) 107   Ht 5' 6 (1.676 m)   Wt 225 lb 12.8 oz (102.4 kg)   LMP 05/06/2023 (Approximate)   SpO2 98%   BMI 36.45 kg/m   Vitals:   09/15/23 1014  BP: 110/70  Pulse: (!) 107  Height: 5' 6 (1.676 m)  Weight: 225 lb 12.8 oz (102.4 kg)  SpO2: 98%  BMI (Calculated): 36.46    Physical Exam Vitals and nursing note reviewed.  Constitutional:      Appearance: Normal appearance. She is normal weight.   HENT:     Head: Normocephalic.  Eyes:     Extraocular Movements: Extraocular movements intact.     Conjunctiva/sclera: Conjunctivae normal.     Pupils: Pupils are equal, round, and reactive to light.  Cardiovascular:     Rate and Rhythm: Normal rate.  Pulmonary:     Effort: Pulmonary effort is normal.  Neurological:     General: No focal deficit present.     Mental Status: She is alert and oriented to person, place, and time. Mental status is at baseline.  Psychiatric:        Mood and Affect: Mood normal.        Behavior: Behavior normal.        Thought Content: Thought content normal.      No results found for any visits on 09/15/23.  Recent Results (from the past 2160 hours)  Lipase, blood     Status: None   Collection Time: 06/27/23 12:49 PM  Result Value Ref Range   Lipase 28 11 - 51 U/L    Comment: Performed at [facility], [address] [place], [place] [address]  Comprehensive metabolic panel     Status: Abnormal   Collection Time: 06/27/23 12:49 PM  Result Value Ref Range   Sodium 140 135 - 145 mmol/L   Potassium 3.3 (L) 3.5 - 5.1 mmol/L   Chloride 106 98 - 111 mmol/L   CO2 24 22 - 32 mmol/L   Glucose, Bld 82 70 - 99 mg/dL    Comment: Glucose reference range applies only to samples taken after fasting for at least 8 hours.   BUN <5 (L) 6 - 20 mg/dL   Creatinine, Ser 9.42 0.44 - 1.00 mg/dL   Calcium  8.8 (L) 8.9 - 10.3 mg/dL   Total Protein 6.3 (L) 6.5 - 8.1 g/dL   Albumin  3.2 (L) 3.5 - 5.0 g/dL   AST 20 15 - 41 U/L   ALT 11 0 - 44 U/L   Alkaline Phosphatase 69 38 - 126 U/L   Total Bilirubin 1.1 0.0 - 1.2 mg/dL   GFR, Estimated >39 >39 mL/min    Comment: (NOTE) Calculated using the CKD-EPI Creatinine Equation (2021)    Anion gap 10 5 - 15    Comment: Performed at [facility], [address] [place], [place] [address]  CBC     Status: None   Collection Time: 06/27/23  12:49 PM  Result Value Ref Range   WBC 7.3 4.0 - 10.5 K/uL    RBC 4.61 3.87 - 5.11 MIL/uL   Hemoglobin 14.4 12.0 - 15.0 g/dL   HCT 56.1 63.9 - 53.9 %   MCV 95.0 80.0 - 100.0 fL   MCH 31.2 26.0 - 34.0 pg   MCHC 32.9 30.0 - 36.0 g/dL   RDW 86.6 88.4 - 84.4 %   Platelets 216 150 - 400 K/uL   nRBC 0.0 0.0 - 0.2 %    Comment: Performed at [facility], [address] [place], [place] [address]  Type and screen [facility]     Status: None   Collection Time: 06/27/23 12:50 PM  Result Value Ref Range   ABO/RH(D) A NEG    Antibody Screen NEG    Sample Expiration      06/30/2023,2359 Performed at [facility], [address] [place], [place] [address]   Urinalysis, Routine w reflex microscopic -Urine, Clean Catch     Status: Abnormal   Collection Time: 06/27/23  2:52 PM  Result Value Ref Range   Color, Urine YELLOW (A) YELLOW   APPearance HAZY (A) CLEAR   Specific Gravity, Urine 1.012 1.005 - 1.030   pH 6.0 5.0 - 8.0   Glucose, UA NEGATIVE NEGATIVE mg/dL   Hgb urine dipstick SMALL (A) NEGATIVE   Bilirubin Urine NEGATIVE NEGATIVE   Ketones, ur NEGATIVE NEGATIVE mg/dL   Protein, ur NEGATIVE NEGATIVE mg/dL   Nitrite NEGATIVE NEGATIVE   Leukocytes,Ua SMALL (A) NEGATIVE   RBC / HPF 0-5 0 - 5 RBC/hpf   WBC, UA 0-5 0 - 5 WBC/hpf   Bacteria, UA MANY (A) NONE SEEN   Squamous Epithelial / HPF 6-10 0 - 5 /HPF   Mucus PRESENT     Comment: Performed at [facility], [address] [place], [place] [address]  Basic metabolic panel with GFR     Status: Abnormal   Collection Time: 07/08/23 11:55 AM  Result Value Ref Range   Sodium 143 135 - 145 mEq/L   Potassium 3.8 3.5 - 5.1 mEq/L   Chloride 106 96 - 112 mEq/L   CO2 33 (H) 19 - 32 mEq/L   Glucose, Bld 84 70 - 99 mg/dL   BUN 6 6 - 23 mg/dL   Creatinine, Ser 9.31 0.40 - 1.20 mg/dL   GFR 895.45 >39.99 mL/min    Comment: Calculated using the CKD-EPI Creatinine Equation (2021)   Calcium  9.1 8.4 - 10.5 mg/dL  CBC with Differential/Platelet     Status:  None   Collection Time: 07/08/23 11:55 AM  Result Value Ref Range   WBC 6.6 4.0 - 10.5 K/uL   RBC 4.78 3.87 - 5.11 Mil/uL   Hemoglobin 14.6 12.0 - 15.0 g/dL   HCT 55.2 63.9 - 53.9 %   MCV 93.5 78.0 - 100.0 fl   MCHC 32.6 30.0 - 36.0 g/dL   RDW 85.8 88.4 - 84.4 %   Platelets 204.0 150.0 - 400.0 K/uL   Neutrophils Relative % 65.0 43.0 - 77.0 %   Lymphocytes Relative 28.9 12.0 - 46.0 %   Monocytes Relative 4.0 3.0 - 12.0 %   Eosinophils Relative 1.3 0.0 - 5.0 %   Basophils Relative 0.8 0.0 - 3.0 %   Neutro Abs 4.3 1.4 - 7.7 K/uL   Lymphs Abs 1.9 0.7 - 4.0 K/uL   Monocytes Absolute 0.3 0.1 - 1.0 K/uL   Eosinophils Absolute 0.1  0.0 - 0.7 K/uL   Basophils Absolute 0.1 0.0 - 0.1 K/uL  Surgical pathology     Status: None   Collection Time: 07/14/23 12:00 AM  Result Value Ref Range   SURGICAL PATHOLOGY      SURGICAL PATHOLOGY [facility] [address] [place], [place] [address] Telephone [phone] or [phone] Fax [phone]  REPORT OF SURGICAL PATHOLOGY   Accession #: [id number] Patient Name: PRITI, CONSOLI Visit # : [id number]  MRN: [id number] Physician: Jordis Cliche DOB/Age Nov 28, 1975 (Age: 11) Gender: F Collected Date: 07/14/2023 Received Date: 07/18/2023  FINAL DIAGNOSIS       1. Hernia sac, and skin :       - HERNIA SAC      - BENIGN UNREMARKABLE SKIN       DATE SIGNED OUT: 07/19/2023 ELECTRONIC SIGNATURE : Rebbecca Md, Nilesh, Pathologist, Electronic Signature  MICROSCOPIC DESCRIPTION  CASE COMMENTS STAINS USED IN DIAGNOSIS: H&E H&E    CLINICAL HISTORY  SPECIMEN(S) OBTAINED 1. Hernia sac, And Skin  SPECIMEN COMMENTS: SPECIMEN CLINICAL INFORMATION: 1. Recurrent ventral hernia greater 10 cm    Gross Description 1. Skin and hernia, received fresh and placed in formalin is a 17.3 x 7.4 cm unori ented ellipse of tan-white, moderately wrinkled skin without discrete defects or lesions. The opposing surface  consists of brown-gray and yellow-tan fibroadipose tissue without discrete necrosis or lesions. The cut surfaces are grossly unremarkable, solid, and homogenous. Representative sections are submitted in 2 blocks (1A-B).      AM[facility] 07/18/2023        Report signed out from the following location(s) [place] [address] [place], [place] [address] CLIA #: [id number]  [facility] [address] [place], [place] [address] CLIA #: [id number]   Pregnancy, urine POC     Status: None   Collection Time: 07/14/23  8:40 AM  Result Value Ref Range   Preg Test, Ur NEGATIVE NEGATIVE    Comment:        THE SENSITIVITY OF THIS METHODOLOGY IS >20 mIU/mL.   HIV Antibody (routine testing w rflx)     Status: None   Collection Time: 07/15/23  5:46 AM  Result Value Ref Range   HIV Screen 4th Generation wRfx Non Reactive Non Reactive    Comment: Performed at [facility], [address], [place], [place] [address]  Comprehensive metabolic panel     Status: Abnormal   Collection Time: 07/15/23  5:46 AM  Result Value Ref Range   Sodium 143 135 - 145 mmol/L   Potassium 4.2 3.5 - 5.1 mmol/L   Chloride 110 98 - 111 mmol/L   CO2 27 22 - 32 mmol/L   Glucose, Bld 118 (H) 70 - 99 mg/dL    Comment: Glucose reference range applies only to samples taken after fasting for at least 8 hours.   BUN 8 6 - 20 mg/dL   Creatinine, Ser 9.41 0.44 - 1.00 mg/dL   Calcium  8.8 (L) 8.9 - 10.3 mg/dL   Total Protein 5.1 (L) 6.5 - 8.1 g/dL   Albumin  2.7 (L) 3.5 - 5.0 g/dL   AST 18 15 - 41 U/L   ALT 9 0 - 44 U/L   Alkaline Phosphatase 53 38 - 126 U/L   Total Bilirubin 1.0 0.0 - 1.2 mg/dL   GFR, Estimated >39 >39 mL/min    Comment: (NOTE) Calculated using the CKD-EPI Creatinine Equation (2021)  Anion gap 6 5 - 15    Comment: Performed at [facility], [address] [place], [place] [address]  Magnesium     Status: None   Collection Time: 07/15/23  5:46 AM   Result Value Ref Range   Magnesium 1.8 1.7 - 2.4 mg/dL    Comment: Performed at [facility], [address] [place], [place] [address]  Phosphorus     Status: None   Collection Time: 07/15/23  5:46 AM  Result Value Ref Range   Phosphorus 4.0 2.5 - 4.6 mg/dL    Comment: Performed at [facility], [address] [place], [place] [address]  CBC     Status: Abnormal   Collection Time: 07/15/23  5:46 AM  Result Value Ref Range   WBC 13.8 (H) 4.0 - 10.5 K/uL   RBC 3.98 3.87 - 5.11 MIL/uL   Hemoglobin 12.2 12.0 - 15.0 g/dL   HCT 60.7 63.9 - 53.9 %   MCV 98.5 80.0 - 100.0 fL   MCH 30.7 26.0 - 34.0 pg   MCHC 31.1 30.0 - 36.0 g/dL   RDW 87.0 88.4 - 84.4 %   Platelets 156 150 - 400 K/uL   nRBC 0.0 0.0 - 0.2 %    Comment: Performed at [facility], [address] [place], [place] [address]  Hemoccult Cards (X3 cards)     Status: None   Collection Time: 08/22/23  8:39 AM  Result Value Ref Range   OCCULT 1 Negative Negative   OCCULT 2 Negative Negative   OCCULT 3 Negative Negative   OCCULT 4 Negative Negative   OCCULT 5 Negative Negative   Fecal Occult Blood Negative Negative       Assessment & Plan Prediabetes A1C Continues to be in prediabetic ranges.  Will reassess at follow up after next lab check.  Patient counseled on dietary choices and verbalized understanding.   -CBC w/Diff -CMP w/eGFR -Hemoglobin A1C  Hypothyroidism, adult Vitamin D  deficiency, unspecified B12 deficiency due to diet Other fatigue Checking labs today.  Will continue supplements as needed.   - Vitamin D  - Vitamin B12 - TSH  Mixed hyperlipidemia Checking labs today.  Continue current therapy for lipid control. Will modify as needed based on labwork results.   -CMP w/eGFR -Lipid Panel  Lymphedema Setting patient up for referral to Vascular .  Will defer to them for further treatment changes.  Reassess at follow up.     Return in about 3 months (around  12/15/2023).   Total time spent: 30 minutes  ALAN CHRISTELLA ARRANT, FNP  09/15/2023   This document may have been prepared by [employer] Voice Recognition software and as such may include unintentional dictation errors.

## 2023-09-16 ENCOUNTER — Encounter: Payer: Self-pay | Admitting: Student in an Organized Health Care Education/Training Program

## 2023-09-16 LAB — CBC WITH DIFFERENTIAL/PLATELET
Basophils Absolute: 0 x10E3/uL (ref 0.0–0.2)
Basos: 0 %
EOS (ABSOLUTE): 0.1 x10E3/uL (ref 0.0–0.4)
Eos: 1 %
Hematocrit: 40.9 % (ref 34.0–46.6)
Hemoglobin: 12.6 g/dL (ref 11.1–15.9)
Immature Grans (Abs): 0 x10E3/uL (ref 0.0–0.1)
Immature Granulocytes: 0 %
Lymphocytes Absolute: 2.5 x10E3/uL (ref 0.7–3.1)
Lymphs: 28 %
MCH: 30.5 pg (ref 26.6–33.0)
MCHC: 30.8 g/dL — ABNORMAL LOW (ref 31.5–35.7)
MCV: 99 fL — ABNORMAL HIGH (ref 79–97)
Monocytes Absolute: 0.3 x10E3/uL (ref 0.1–0.9)
Monocytes: 4 %
Neutrophils Absolute: 5.9 x10E3/uL (ref 1.4–7.0)
Neutrophils: 67 %
Platelets: 234 x10E3/uL (ref 150–450)
RBC: 4.13 x10E6/uL (ref 3.77–5.28)
RDW: 12.7 % (ref 11.7–15.4)
WBC: 8.9 x10E3/uL (ref 3.4–10.8)

## 2023-09-16 LAB — TSH: TSH: 0.048 u[IU]/mL — ABNORMAL LOW (ref 0.450–4.500)

## 2023-09-16 LAB — CMP14+EGFR
ALT: 7 IU/L (ref 0–32)
AST: 15 IU/L (ref 0–40)
Albumin: 3.2 g/dL — ABNORMAL LOW (ref 3.9–4.9)
Alkaline Phosphatase: 119 IU/L (ref 44–121)
BUN/Creatinine Ratio: 6 — ABNORMAL LOW (ref 9–23)
BUN: 4 mg/dL — ABNORMAL LOW (ref 6–24)
Bilirubin Total: 0.6 mg/dL (ref 0.0–1.2)
CO2: 29 mmol/L (ref 20–29)
Calcium: 8.8 mg/dL (ref 8.7–10.2)
Chloride: 104 mmol/L (ref 96–106)
Creatinine, Ser: 0.65 mg/dL (ref 0.57–1.00)
Globulin, Total: 2.3 g/dL (ref 1.5–4.5)
Glucose: 82 mg/dL (ref 70–99)
Potassium: 3.2 mmol/L — ABNORMAL LOW (ref 3.5–5.2)
Sodium: 146 mmol/L — ABNORMAL HIGH (ref 134–144)
Total Protein: 5.5 g/dL — ABNORMAL LOW (ref 6.0–8.5)
eGFR: 110 mL/min/1.73 (ref >59)

## 2023-09-16 LAB — LIPID PANEL
Chol/HDL Ratio: 1.8 ratio (ref 0.0–4.4)
Cholesterol, Total: 103 mg/dL (ref 100–199)
HDL: 56 mg/dL (ref >39)
LDL Chol Calc (NIH): 30 mg/dL (ref 0–99)
Triglycerides: 87 mg/dL (ref 0–149)
VLDL Cholesterol Cal: 17 mg/dL (ref 5–40)

## 2023-09-16 LAB — VITAMIN D 25 HYDROXY (VIT D DEFICIENCY, FRACTURES): Vit D, 25-Hydroxy: 44.5 ng/mL (ref 30.0–100.0)

## 2023-09-16 LAB — HEMOGLOBIN A1C
Est. average glucose Bld gHb Est-mCnc: 85 mg/dL
Hgb A1c MFr Bld: 4.6 % — ABNORMAL LOW (ref 4.8–5.6)

## 2023-09-16 LAB — VITAMIN B12: Vitamin B-12: 853 pg/mL (ref 232–1245)

## 2023-09-18 ENCOUNTER — Encounter: Payer: Self-pay | Admitting: Family

## 2023-09-18 NOTE — Assessment & Plan Note (Signed)
 Checking labs today.  Will continue supplements as needed.   - Vitamin D  - Vitamin B12 - TSH

## 2023-09-18 NOTE — Assessment & Plan Note (Signed)
.  A1C Continues to be in prediabetic ranges.  Will reassess at follow up after next lab check.  Patient counseled on dietary choices and verbalized understanding.   -CBC w/Diff -CMP w/eGFR -Hemoglobin A1C

## 2023-09-19 ENCOUNTER — Ambulatory Visit (INDEPENDENT_AMBULATORY_CARE_PROVIDER_SITE_OTHER): Admitting: Surgery

## 2023-09-19 ENCOUNTER — Encounter: Payer: Self-pay | Admitting: Surgery

## 2023-09-19 VITALS — BP 108/73 | HR 97 | Ht 66.0 in | Wt 221.0 lb

## 2023-09-19 DIAGNOSIS — Z09 Encounter for follow-up examination after completed treatment for conditions other than malignant neoplasm: Secondary | ICD-10-CM

## 2023-09-19 DIAGNOSIS — K430 Incisional hernia with obstruction, without gangrene: Secondary | ICD-10-CM

## 2023-09-19 NOTE — Patient Instructions (Signed)
 Continue with dressing changes as needed.  Follow-up with our office in 2 months.    Please call and ask to speak with a nurse if you develop questions or concerns.

## 2023-09-19 NOTE — Progress Notes (Signed)
 Outpatient Surgical Follow Up  09/19/2023  Karen Hill is an 48 y.o. female.   Chief Complaint  Patient presents with   Routine Post Op    HPI: Karen Hill 9 weeks out from open AWR, she is doing better, wound healing, no fevers, no abd pain, tolerating PO. Min issue is lymphedema and back pain. Still smoking  Past Medical History:  Diagnosis Date   Dyspnea    GERD (gastroesophageal reflux disease)    Hypothyroidism    Liver hemangioma    Lymphedema    Sleep apnea    no cpap   Thyroid disease     Past Surgical History:  Procedure Laterality Date   CESAREAN SECTION  2014   CHOLECYSTECTOMY  2005   COLONOSCOPY WITH PROPOFOL  N/A 03/09/2023   Procedure: COLONOSCOPY WITH PROPOFOL ;  Surgeon: Therisa Bi, MD;  Location: [facility] ENDOSCOPY;  Service: Gastroenterology;  Laterality: N/A;   INSERTION OF MESH  07/14/2023   Procedure: INSERTION OF MESH;  Surgeon: Jordis Laneta FALCON, MD;  Location: [facility] ORS;  Service: General;;   MANDIBLE FRACTURE SURGERY     MULTIPLE TOOTH EXTRACTIONS     TUBAL LIGATION  2014   XI ROBOTIC ASSISTED VENTRAL HERNIA N/A 07/14/2023   Procedure: REPAIR, HERNIA, VENTRAL, ROBOT-ASSISTED CONVERTED TO OPEN PROCEDURE;  Surgeon: Jordis Laneta FALCON, MD;  Location: [facility] ORS;  Service: General;  Laterality: N/A;    Family History  Problem Relation Age of Onset   Diabetes Mother    Hypertension Father    Breast cancer Neg Hx     Social History:  reports that she has been smoking cigarettes. She has a 10 pack-year smoking history. She has been exposed to tobacco smoke. She has never used smokeless tobacco. She reports current alcohol use. She reports that she does not use drugs.  Allergies:  Allergies  Allergen Reactions   Penicillin G Hives and Itching   Amoxicillin    Shellfish Allergy Nausea And Vomiting    Medications reviewed.    ROS Full ROS performed and is otherwise negative other than what is stated in HPI   BP 108/73   Pulse 97   Ht 5' 6 (1.676 m)   Wt  221 lb (100.2 kg)   LMP 05/06/2023 (Approximate)   SpO2 97%   BMI 35.67 kg/m   Physical Exam Chronically ill Abd: soft, incision healing well, 2x1 mm wound very superficial w/o drainage. No hernia recurrence no infection, no peritontiis     Assessment/Plan: Doing well No infection or complications Encourage her to quit smoking RTC 2 months   Laneta Jordis, MD [facility] General Surgeon

## 2023-09-21 MED ORDER — METHYLPREDNISOLONE 4 MG PO TBPK: ORAL_TABLET | ORAL | 0 refills | Status: DC

## 2023-09-21 NOTE — Addendum Note (Signed)
 Addended byBETHA HILMA HASTINGS on: 09/21/2023 04:26 PM   Modules accepted: Orders

## 2023-09-26 ENCOUNTER — Encounter: Payer: Self-pay | Admitting: Family

## 2023-09-30 ENCOUNTER — Ambulatory Visit (INDEPENDENT_AMBULATORY_CARE_PROVIDER_SITE_OTHER): Admitting: Nurse Practitioner

## 2023-09-30 ENCOUNTER — Encounter (INDEPENDENT_AMBULATORY_CARE_PROVIDER_SITE_OTHER): Payer: Self-pay | Admitting: Nurse Practitioner

## 2023-09-30 VITALS — BP 115/82 | HR 98 | Ht 66.0 in | Wt 205.8 lb

## 2023-09-30 DIAGNOSIS — G894 Chronic pain syndrome: Secondary | ICD-10-CM | POA: Diagnosis not present

## 2023-09-30 DIAGNOSIS — I890 Lymphedema, not elsewhere classified: Secondary | ICD-10-CM | POA: Diagnosis not present

## 2023-09-30 NOTE — Progress Notes (Signed)
 Subjective:    Patient ID: Karen Hill, female    DOB: 09-05-1975, 48 y.o.   MRN: [id number] Chief Complaint  Patient presents with   New Patient (Initial Visit)    Nerve pain in both feet dull and achy, swelling has contributed to her not being steady on her feet and able to balance, took some of husbands lasix this week 40 mg this week has never been prescribed this medication but thinks it helped her    The patient presents today for evaluation of lymphedema as referred by her primary care provider.  She notes that she has had lymphedema for years.  She is diligent with utilizing medical grade compression and elevation.  She also has a lymphedema pump however he does not function properly.  She notes that it has a leak and it does not fully inflate as it should.  She also notes that she has some achiness and discomfort after standing for long periods of time in her legs.  Does not necessarily happen when she is standing but she does feel unstable when she is standing.  She does have some sacroiliac nerve issues as well as a history of chronic pain syndrome.    Review of Systems  Cardiovascular:  Positive for leg swelling.  All other systems reviewed and are negative.      Objective:   Physical Exam Vitals reviewed.  HENT:     Head: Normocephalic.  Cardiovascular:     Rate and Rhythm: Normal rate.  Pulmonary:     Effort: Pulmonary effort is normal.  Musculoskeletal:     Right lower leg: Edema present.     Left lower leg: Edema present.  Skin:    General: Skin is warm and dry.  Neurological:     Mental Status: She is alert and oriented to person, place, and time.     Motor: Weakness present.  Psychiatric:        Mood and Affect: Mood normal.        Behavior: Behavior normal.        Thought Content: Thought content normal.        Judgment: Judgment normal.     BP 115/82   Pulse 98   Ht 5' 6 (1.676 m)   Wt 205 lb 12.8 oz (93.4 kg)   LMP 05/06/2023 (Approximate)    BMI 33.22 kg/m   Past Medical History:  Diagnosis Date   Dyspnea    GERD (gastroesophageal reflux disease)    Hypothyroidism    Liver hemangioma    Lymphedema    Sleep apnea    no cpap   Thyroid disease     Social History   Socioeconomic History   Marital status: Married    Spouse name: Not on file   Number of children: Not on file   Years of education: Not on file   Highest education level: Not on file  Occupational History   Not on file  Tobacco Use   Smoking status: Every Day    Current packs/day: 1.00    Average packs/day: 1 pack/day for 10.0 years (10.0 ttl pk-yrs)    Types: Cigarettes    Passive exposure: Past   Smokeless tobacco: Never  Vaping Use   Vaping status: Former  Substance and Sexual Activity   Alcohol use: Yes    Comment: rare   Drug use: No   Sexual activity: Not on file  Other Topics Concern   Not on file  Social History  Narrative   Not on file   Social Drivers of Health   Financial Resource Strain: Not on file  Food Insecurity: No Food Insecurity (07/16/2023)   Hunger Vital Sign    Worried About Running Out of Food in the Last Year: Never true    Ran Out of Food in the Last Year: Never true  Transportation Needs: No Transportation Needs (07/16/2023)   PRAPARE - Administrator, Civil Service (Medical): No    Lack of Transportation (Non-Medical): No  Physical Activity: Not on file  Stress: Not on file  Social Connections: Not on file  Intimate Partner Violence: Not At Risk (07/16/2023)   Humiliation, Afraid, Rape, and Kick questionnaire    Fear of Current or Ex-Partner: No    Emotionally Abused: No    Physically Abused: No    Sexually Abused: No    Past Surgical History:  Procedure Laterality Date   CESAREAN SECTION  2014   CHOLECYSTECTOMY  2005   COLONOSCOPY WITH PROPOFOL  N/A 03/09/2023   Procedure: COLONOSCOPY WITH PROPOFOL ;  Surgeon: Therisa Bi, MD;  Location: [facility];  Service: Gastroenterology;  Laterality:  N/A;   INSERTION OF MESH  07/14/2023   Procedure: INSERTION OF MESH;  Surgeon: Jordis Laneta FALCON, MD;  Location: [facility] ORS;  Service: General;;   MANDIBLE FRACTURE SURGERY     MULTIPLE TOOTH EXTRACTIONS     TUBAL LIGATION  2014   XI ROBOTIC ASSISTED VENTRAL HERNIA N/A 07/14/2023   Procedure: REPAIR, HERNIA, VENTRAL, ROBOT-ASSISTED CONVERTED TO OPEN PROCEDURE;  Surgeon: Jordis Laneta FALCON, MD;  Location: [facility] ORS;  Service: General;  Laterality: N/A;    Family History  Problem Relation Age of Onset   Diabetes Mother    Hypertension Father    Breast cancer Neg Hx     Allergies  Allergen Reactions   Penicillin G Hives and Itching   Amoxicillin    Shellfish Allergy Nausea And Vomiting       Latest Ref Rng & Units 09/15/2023   10:57 AM 07/15/2023    5:46 AM 07/08/2023   11:55 AM  CBC  WBC 3.4 - 10.8 x10E3/uL 8.9  13.8  6.6   Hemoglobin 11.1 - 15.9 g/dL 87.3  87.7  85.3   Hematocrit 34.0 - 46.6 % 40.9  39.2  44.7   Platelets 150 - 450 x10E3/uL 234  156  204.0       CMP     Component Value Date/Time   NA 146 (H) 09/15/2023 1057   K 3.2 (L) 09/15/2023 1057   CL 104 09/15/2023 1057   CO2 29 09/15/2023 1057   GLUCOSE 82 09/15/2023 1057   GLUCOSE 118 (H) 07/15/2023 0546   BUN 4 (L) 09/15/2023 1057   CREATININE 0.65 09/15/2023 1057   CALCIUM  8.8 09/15/2023 1057   PROT 5.5 (L) 09/15/2023 1057   ALBUMIN  3.2 (L) 09/15/2023 1057   AST 15 09/15/2023 1057   ALT 7 09/15/2023 1057   ALKPHOS 119 09/15/2023 1057   BILITOT 0.6 09/15/2023 1057   GFR 104.54 07/08/2023 1155   EGFR 110 09/15/2023 1057   GFRNONAA >60 07/15/2023 0546     No results found.     Assessment & Plan:   1. Lymphedema of both lower extremities (Primary) Recommend:  No surgery or intervention at this point in time.   The Patient is CEAP [id number].  The patient has been wearing compression for more than 12 weeks with no or little benefit.  The patient has been  exercising daily for more than 12 weeks. The patient has  been elevating and taking OTC pain medications for more than 12 weeks.  None of these have have eliminated the pain related to the lymphedema or the discomfort regarding excessive swelling and venous congestion.    I have reviewed my discussion with the patient regarding lymphedema and why it  causes symptoms.  Patient will continue wearing graduated compression on a daily basis. The patient should put the compression on first thing in the morning and removing them in the evening. The patient should not sleep in the compression.   In addition, behavioral modification throughout the day will be continued.  This will include frequent elevation (such as in a recliner), use of over the counter pain medications as needed and exercise such as walking.  The systemic causes for chronic edema such as liver, kidney and cardiac etiologies do not appear to have significant changed over the past year.    The patient has chronic , severe lymphedema with hyperpigmentation of the skin and has done MLD, skin care, medication, diet, exercise, elevation and compression for 4 weeks with no improvement,  I am recommending a lymphedema pump.  The patient still has stage 3 lymphedema and therefore, I believe that a lymph pump is needed to improve the control of the patient's lymphedema and improve the quality of life.  Additionally, a lymph pump is warranted because it will reduce the risk of cellulitis and ulceration in the future.  Patient should follow-up in six months   2. Chronic pain syndrome The patient does have known chronic pain.  I believe this is component of the pain discomfort that she is having.  I do also believe that her lymphedema has not been as well-controlled is also creating inflammation which may be exacerbating this in her legs.  Will also have her return to ensure that there is no venous insufficiency driving her edema as well.   Current Outpatient Medications on File Prior to Visit  Medication  Sig Dispense Refill   albuterol  (PROAIR  HFA) 108 (90 Base) MCG/ACT inhaler Inhale 2 puffs into the lungs every 6 (six) hours as needed for wheezing or shortness of breath. 108 each 3   baclofen  (LIORESAL ) 10 MG tablet TAKE 1 TABLET BY MOUTH TWICE A DAY AS NEEDED FOR MUSCLE SPASMS (Patient taking differently: Take 20 mg by mouth at bedtime.) 180 tablet 1   Cholecalciferol (VITAMIN D3) 50 MCG (2000 UT) TABS Take 2,000 Units by mouth in the morning.     cyanocobalamin  (VITAMIN B12) 1000 MCG/ML injection INJECT 1 ML (1,000 MCG TOTAL) INTO THE MUSCLE EVERY 14 (FOURTEEN) DAYS. 6 mL 0   ferrous sulfate 325 (65 FE) MG tablet Take 325 mg by mouth in the morning.     gabapentin  (NEURONTIN ) 300 MG capsule TAKE 1 CAPSULE BY MOUTH NIGHTLY AT BEDTIME FOR 7 DAYS, INCREASE TO 2 CAPS AT BEDTIME FOR NEUROPATHY, APPT TIME AND LABS PLEASE 60 capsule 6   lansoprazole  (PREVACID ) 30 MG capsule Take 1 capsule (30 mg total) by mouth daily at 12 noon. 90 capsule 3   levothyroxine  (SYNTHROID ) 125 MCG tablet TAKE 1 TABLET BY MOUTH DAILY BEFORE BREAKFAST. 90 tablet 1   linaclotide  (LINZESS ) 145 MCG CAPS capsule Take 1 capsule (145 mcg total) by mouth daily before breakfast. 90 capsule 3   methylPREDNISolone  (MEDROL  DOSEPAK) 4 MG TBPK tablet Use as directed x 6 days. 1 each 0   rosuvastatin  (CRESTOR ) 5 MG tablet TAKE 1 TABLET (  5 MG TOTAL) BY MOUTH DAILY. 90 tablet 0   Semaglutide -Weight Management (WEGOVY ) 1 MG/0.5ML SOAJ INJECT 1MG  INTO THE SKIN ONCE A WEEK (Patient taking differently: Inject 1 mg into the skin every Saturday.) 2 mL 1   Syringe/Needle, Disp, (SYRINGE 3CC/20GX1) 20G X 1 3 ML MISC 1 each by Does not apply route every 14 (fourteen) days. For use with B12 injections 6 each 3   No current facility-administered medications on file prior to visit.    There are no Patient Instructions on file for this visit. No follow-ups on file.   Jalen Daluz E Quentavious Rittenhouse, NP

## 2023-10-03 ENCOUNTER — Other Ambulatory Visit: Payer: Self-pay | Admitting: Family

## 2023-10-03 DIAGNOSIS — E039 Hypothyroidism, unspecified: Secondary | ICD-10-CM

## 2023-10-12 ENCOUNTER — Other Ambulatory Visit: Payer: Self-pay | Admitting: Family

## 2023-10-18 ENCOUNTER — Ambulatory Visit
Attending: Student in an Organized Health Care Education/Training Program | Admitting: Student in an Organized Health Care Education/Training Program

## 2023-10-18 ENCOUNTER — Encounter: Payer: Self-pay | Admitting: Student in an Organized Health Care Education/Training Program

## 2023-10-18 VITALS — BP 120/59 | HR 107 | Temp 97.5°F | Resp 16 | Ht 66.0 in | Wt 205.0 lb

## 2023-10-18 DIAGNOSIS — F17210 Nicotine dependence, cigarettes, uncomplicated: Secondary | ICD-10-CM | POA: Insufficient documentation

## 2023-10-18 DIAGNOSIS — M5416 Radiculopathy, lumbar region: Secondary | ICD-10-CM | POA: Insufficient documentation

## 2023-10-18 DIAGNOSIS — G8929 Other chronic pain: Secondary | ICD-10-CM | POA: Insufficient documentation

## 2023-10-18 DIAGNOSIS — G894 Chronic pain syndrome: Secondary | ICD-10-CM | POA: Diagnosis not present

## 2023-10-18 NOTE — Patient Instructions (Signed)
 ______________________________________________________________________    General Risks and Possible Complications  Patient Responsibilities: It is important that you read this as it is part of your informed consent. It is our duty to inform you of the risks and possible complications associated with treatments offered to you. It is your responsibility as a patient to read this and to ask questions about anything that is not clear or that you believe was not covered in this document.  Patient's Rights: You have the right to refuse treatment. You also have the right to change your mind, even after initially having agreed to have the treatment done. However, under this last option, if you wait until the last second to change your mind, you may be charged for the materials used up to that point.  Introduction: Medicine is not an Visual merchandiser. Everything in Medicine, including the lack of treatment(s), carries the potential for danger, harm, or loss (which is by definition: Risk). In Medicine, a complication is a secondary problem, condition, or disease that can aggravate an already existing one. All treatments carry the risk of possible complications. The fact that a side effects or complications occurs, does not imply that the treatment was conducted incorrectly. It must be clearly understood that these can happen even when everything is done following the highest safety standards.  No treatment: You can choose not to proceed with the proposed treatment alternative. The "PRO(s)" would include: avoiding the risk of complications associated with the therapy. The "CON(s)" would include: not getting any of the treatment benefits. These benefits fall under one of three categories: diagnostic; therapeutic; and/or palliative. Diagnostic benefits include: getting information which can ultimately lead to improvement of the disease or symptom(s). Therapeutic benefits are those associated with the successful  treatment of the disease. Finally, palliative benefits are those related to the decrease of the primary symptoms, without necessarily curing the condition (example: decreasing the pain from a flare-up of a chronic condition, such as incurable terminal cancer).  General Risks and Complications: These are associated to most interventional treatments. They can occur alone, or in combination. They fall under one of the following six (6) categories: no benefit or worsening of symptoms; bleeding; infection; nerve damage; allergic reactions; and/or death. No benefits or worsening of symptoms: In Medicine there are no guarantees, only probabilities. No healthcare provider can ever guarantee that a medical treatment will work, they can only state the probability that it may. Furthermore, there is always the possibility that the condition may worsen, either directly, or indirectly, as a consequence of the treatment. Bleeding: This is more common if the patient is taking a blood thinner, either prescription or over the counter (example: Goody Powders, Fish oil, Aspirin, Garlic, etc.), or if suffering a condition associated with impaired coagulation (example: Hemophilia, cirrhosis of the liver, low platelet counts, etc.). However, even if you do not have one on these, it can still happen. If you have any of these conditions, or take one of these drugs, make sure to notify your treating physician. Infection: This is more common in patients with a compromised immune system, either due to disease (example: diabetes, cancer, human immunodeficiency virus [HIV], etc.), or due to medications or treatments (example: therapies used to treat cancer and rheumatological diseases). However, even if you do not have one on these, it can still happen. If you have any of these conditions, or take one of these drugs, make sure to notify your treating physician. Nerve Damage: This is more common when the treatment is  an invasive one, but it  can also happen with the use of medications, such as those used in the treatment of cancer. The damage can occur to small secondary nerves, or to large primary ones, such as those in the spinal cord and brain. This damage may be temporary or permanent and it may lead to impairments that can range from temporary numbness to permanent paralysis and/or brain death. Allergic Reactions: Any time a substance or material comes in contact with our body, there is the possibility of an allergic reaction. These can range from a mild skin rash (contact dermatitis) to a severe systemic reaction (anaphylactic reaction), which can result in death. Death: In general, any medical intervention can result in death, most of the time due to an unforeseen complication. ______________________________________________________________________      ______________________________________________________________________    Preparing for your procedure  Appointments: If you think you may not be able to keep your appointment, call 24-48 hours in advance to cancel. We need time to make it available to others.  Procedure visits are for procedures only. During your procedure appointment there will be: NO Prescription Refills*. NO medication changes or discussions*. NO discussion of disability issues*. NO unrelated pain problem evaluations*. NO evaluations to order other pain procedures*. *These will be addressed at a separate and distinct evaluation encounter on the provider's evaluation schedule and not during procedure days.  Instructions: Food intake: Avoid eating anything solid for at least 8 hours prior to your procedure. Clear liquid intake: You may take clear liquids such as water up to 2 hours prior to your procedure. (No carbonated drinks. No soda.) Transportation: Unless otherwise stated by your physician, bring a driver. (Driver cannot be a Market researcher, Pharmacist, community, or any other form of public transportation.) Morning  Medicines: Except for blood thinners, take all of your other morning medications with a sip of water. Make sure to take your heart and blood pressure medicines. If your blood pressure's lower number is above 100, the case will be rescheduled. Blood thinners: Make sure to stop your blood thinners as instructed.  If you take a blood thinner, but were not instructed to stop it, call our office [phone] and ask to talk to a nurse. Not stopping a blood thinner prior to certain procedures could lead to serious complications. Diabetics on insulin: Notify the staff so that you can be scheduled 1st case in the morning. If your diabetes requires high dose insulin, take only  of your normal insulin dose the morning of the procedure and notify the staff that you have done so. Preventing infections: Shower with an antibacterial soap the morning of your procedure.  Build-up your immune system: Take 1000 mg of Vitamin C with every meal (3 times a day) the day prior to your procedure. Antibiotics: Inform the nursing staff if you are taking any antibiotics or if you have any conditions that may require antibiotics prior to procedures. (Example: recent joint implants)   Pregnancy: If you are pregnant make sure to notify the nursing staff. Not doing so may result in injury to the fetus, including death.  Sickness: If you have a cold, fever, or any active infections, call and cancel or reschedule your procedure. Receiving steroids while having an infection may result in complications. Arrival: You must be in the facility at least 30 minutes prior to your scheduled procedure. Tardiness: Your scheduled time is also the cutoff time. If you do not arrive at least 15 minutes prior to your procedure, you will  be rescheduled.  Children: Do not bring any children with you. Make arrangements to keep them home. Dress appropriately: There is always a possibility that your clothing may get soiled. Avoid long dresses. Valuables:  Do not bring any jewelry or valuables.  Reasons to call and reschedule or cancel your procedure: (Following these recommendations will minimize the risk of a serious complication.) Surgeries: Avoid having procedures within 2 weeks of any surgery. (Avoid for 2 weeks before or after any surgery). Flu Shots: Avoid having procedures within 2 weeks of a flu shots or . (Avoid for 2 weeks before or after immunizations). Barium: Avoid having a procedure within 7-10 days after having had a radiological study involving the use of radiological contrast. (Myelograms, Barium swallow or enema study). Heart attacks: Avoid any elective procedures or surgeries for the initial 6 months after a "Myocardial Infarction" (Heart Attack). Blood thinners: It is imperative that you stop these medications before procedures. Let us know if you if you take any blood thinner.  Infection: Avoid procedures during or within two weeks of an infection (including chest colds or gastrointestinal problems). Symptoms associated with infections include: Localized redness, fever, chills, night sweats or profuse sweating, burning sensation when voiding, cough, congestion, stuffiness, runny nose, sore throat, diarrhea, nausea, vomiting, cold or Flu symptoms, recent or current infections. It is specially important if the infection is over the area that we intend to treat. Heart and lung problems: Symptoms that may suggest an active cardiopulmonary problem include: cough, chest pain, breathing difficulties or shortness of breath, dizziness, ankle swelling, uncontrolled high or unusually low blood pressure, and/or palpitations. If you are experiencing any of these symptoms, cancel your procedure and contact your primary care physician for an evaluation.  Remember:  Regular Business hours are:  Monday to Thursday 8:00 AM to 4:00 PM  Provider's Schedule: Delano Metz, MD:  Procedure days: Tuesday and Thursday 7:30 AM to 4:00 PM  Edward Jolly, MD:  Procedure days: Monday and Wednesday 7:30 AM to 4:00 PM Last  Updated: 12/21/2022 ______________________________________________________________________     Selective Nerve Root Block Patient Information  Description: Specific nerve roots exit the spinal canal and these nerves can be compressed and inflamed by a bulging disc and bone spurs.  By injecting steroids on the nerve root, we can potentially decrease the inflammation surrounding these nerves, which often leads to decreased pain.  Also, by injecting local anesthesia on the nerve root, this can provide [place] helpful information to give to your referring doctor if it decreases your pain.  Selective nerve root blocks can be done along the spine from the neck to the low back depending on the location of your pain.   After numbing the skin with local anesthesia, a small needle is passed to the nerve root and the position of the needle is verified using x-ray pictures.  After the needle is in correct position, we then deposit the medication.  You may experience a pressure sensation while this is being done.  The entire block usually lasts less than 15 minutes.  Conditions that may be treated with selective nerve root blocks: Low back and leg pain Spinal stenosis Diagnostic block prior to potential surgery Neck and arm pain Post laminectomy syndrome  Preparation for the injection:  Do not eat any solid food or dairy products within 8 hours of your appointment. You may drink clear liquids up to 3 hours before an appointment.  Clear liquids include water, black coffee, juice or soda.  No milk  or cream please. You may take your regular medications, including pain medications, with a sip of water before your appointment.  Diabetics should hold regular insulin (if taken separately) and take 1/2 normal NPH dose the morning of the procedure.  Carry some sugar containing items with you to your appointment. A driver must accompany you and be  prepared to drive you home after your procedure. Bring all your current medications with you. An IV may be inserted and sedation may be given at the discretion of the physician. A blood pressure cuff, EKG, and other monitors will often be applied during the procedure.  Some patients may need to have extra oxygen administered for a short period. You will be asked to provide medical information, including allergies, prior to the procedure.  We must know immediately if you are taking blood  Thinners (like Coumadin) or if you are allergic to IV iodine contrast (dye).  Possible side-effects: All are usually temporary Bleeding from needle site Light headedness Numbness and tingling Decreased blood pressure Weakness in arms/legs Pressure sensation in back/neck Pain at injection site (several days)  Possible complications: All are extremely rare Infection Nerve injury Spinal headache (a headache wore with upright position)  Call if you experience: Fever/chills associated with headache or increased back/neck pain Headache worsened by an upright position New onset weakness or numbness of an extremity below the injection site Hives or difficulty breathing (go to the emergency room) Inflammation or drainage at the injection site(s) Severe back/neck pain greater than usual New symptoms which are concerning to you  Please note:  Although the local anesthetic injected can often make your back or neck feel good for several hours after the injection the pain will likely return.  It takes 3-5 days for steroids to work on the nerve root. You may not notice any pain relief for at least one week.  If effective, we will often do a series of 3 injections spaced 3-6 weeks apart to maximally decrease your pain.    If you have any questions, please call [phone] [facility]

## 2023-10-18 NOTE — Progress Notes (Signed)
 Safety precautions to be maintained throughout the outpatient stay will include: orient to surroundings, keep bed in low position, maintain call bell within reach at all times, provide assistance with transfer out of bed and ambulation.

## 2023-10-18 NOTE — Progress Notes (Signed)
 PROVIDER NOTE: Interpretation of information contained herein should be left to medically-trained personnel. Specific patient instructions are provided elsewhere under Patient Instructions section of medical record. This document was created in part using AI and STT-dictation technology, any transcriptional errors that may result from this process are unintentional.  Patient: Karen Hill  Service: E/M   PCP: Karen Alan HERO, FNP  DOB: [birth year]  DOS: 10/18/2023  Provider: Wallie Sherry, MD  MRN: [id number]  Delivery: Face-to-face  Specialty: Interventional Pain Management  Type: Established Patient  Setting: Ambulatory outpatient facility  Specialty designation: 09  Referring Prov.: Karen Hastings, PA-C  Location: Outpatient office facility       History of present illness (HPI) Karen Hill, a 48 y.o. year old female, is here today because of her Lumbar radiculopathy [M54.16]. Karen Hill's primary complain today is Back Pain (Lumbar bilateral ) and Foot Pain (Bilateral )  Pertinent problems: Karen Hill does not have any pertinent problems on file.  Pain Assessment: Severity of Chronic pain is reported as a 5 /10. Location: Back (bilateral feet) Lower, Left, Right/back pain down the outsides of legs to the knee, ? foot pain up to the knee encompassing all of lower leg anterior and posterior. Onset: More than a month ago. Quality: Discomfort, Stabbing, Burning, Numbness, Tingling, Sharp, Shooting, Constant, Pins and needles. Timing: Constant. Modifying factor(s): left over pain medication from hernia surgery that would calm it down, Percocet. Vitals:  height is 5' 6 (1.676 m) and weight is 205 lb (93 kg). Her temporal temperature is 97.5 F (36.4 C) (abnormal). Her blood pressure is 120/59 (abnormal) and her pulse is 107 (abnormal). Her respiration is 16 and oxygen saturation is 100%.  BMI: Estimated body mass index is 33.09 kg/m as calculated from the following:   Height as of  this encounter: 5' 6 (1.676 m).   Weight as of this encounter: 205 lb (93 kg).  Last encounter: 04/21/2023. Last procedure: Visit date not found.  Reason for encounter: Referred from neurosurgery to reconsider lumbar epidural steroid injection after completing physical therapy and continuing to have persistent lumbar radicular pain  Discussed the use of AI scribe software for clinical note transcription with the patient, who gave verbal consent to proceed.  History of Present Illness   Karen Hill is a 48 year old female who presents with bilateral leg pain. 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 The pain radiates down both legs, initially affecting only the right leg but has since progressed to involve both legs equally. She describes the sensation in her feet as feeling like 'wearing porcupine shoes' and experiences sharp pains in random areas.  She has undergone physical therapy from May 14th to June 26th, 2025, at [facility], and had a ventral hernia repair in July 2025. She is currently taking baclofen  and gabapentin  600 mg daily, usually at night before bed. She has not tried any other medications for this condition.  She continues to smoke about two packs a day. No pulmonary issues such as COPD. She has never had spine surgery and is not on any blood thinners.      Initial clinic visit with me 04/21/2023 Karen Hill is a 48 year old female who presents with low back pain radiating to the right leg. She was referred by neurosurgery to avoid surgery.  She experiences low back pain that radiates into her right leg, extending to her toes. The pain is described as burning, tingling, and numbness. This has been ongoing for at least three years but has worsened in the past several months following a fall.   The initial  cause of her symptoms was a bulging disc, which has progressed to affect the S1 nerve. MRI images show a more pronounced disc bulge on the right side.   She is currently taking gabapentin  for her symptoms. No significant symptoms in her left leg, although she has started to feel some numbness in her toes.   She has not engaged in any specific exercises for her condition but has attempted home stretches.   ROS  Constitutional: Denies any fever or chills Gastrointestinal: No reported hemesis, hematochezia, vomiting, or acute GI distress Musculoskeletal: Denies any acute onset joint swelling, redness, loss of ROM, or weakness Neurological: Bilateral leg pain  Medication Review  SYRINGE 3CC/20GX1, Vitamin D3, albuterol , baclofen , cyanocobalamin , ferrous sulfate, gabapentin , lansoprazole , levothyroxine , linaclotide , methylPREDNISolone , oxyCODONE -acetaminophen , rosuvastatin , and semaglutide -weight management  History Review  Allergy: Karen Hill is allergic to penicillin g, amoxicillin, and shellfish allergy. Drug: Karen Hill  reports no history of drug use. Alcohol:  reports current alcohol use. Tobacco:  reports that she has been smoking cigarettes. She has a 10 pack-year smoking history. She has been exposed to tobacco smoke. She has never used smokeless tobacco. Social: Karen Hill  reports that she has been smoking cigarettes. She has a 10 pack-year smoking history. She has been exposed to tobacco smoke. She has never used smokeless tobacco. She reports current alcohol use. She reports that she does not use drugs. Medical:  has a past medical history of Dyspnea, GERD (gastroesophageal reflux disease), Hypothyroidism, Liver hemangioma, Lymphedema, Sleep apnea, and Thyroid disease. Surgical: Karen Hill  has a past surgical history that includes Cholecystectomy (2005); Cesarean section (2014); Tubal ligation (2014); Multiple tooth extractions; Mandible fracture surgery; Colonoscopy with  propofol  (N/A, 03/09/2023); XI robotic assisted ventral hernia (N/A, 07/14/2023); and Insertion of mesh (07/14/2023). Family: family history includes Diabetes in her mother; Hypertension in her father.  Laboratory Chemistry Profile   Renal Lab Results  Component Value Date   BUN 4 (L) 09/15/2023   CREATININE 0.65 09/15/2023   BCR 6 (L) 09/15/2023   GFR 104.54 07/08/2023   GFRNONAA >60 07/15/2023    Hepatic Lab Results  Component Value Date   AST 15 09/15/2023   ALT 7 09/15/2023   ALBUMIN  3.2 (L) 09/15/2023   ALKPHOS 119 09/15/2023   LIPASE 28 06/27/2023    Electrolytes Lab Results  Component Value Date   NA 146 (H) 09/15/2023   K 3.2 (L) 09/15/2023   CL 104 09/15/2023   CALCIUM  8.8 09/15/2023   MG 1.8 07/15/2023   PHOS 4.0 07/15/2023    Bone Lab Results  Component Value Date   VD25OH 44.5 09/15/2023    Inflammation (CRP: Acute Phase) (ESR: Chronic Phase) No results found for: CRP, ESRSEDRATE, LATICACIDVEN       Note: Above Lab results reviewed.  Recent Imaging Review  CT ABDOMEN PELVIS W CONTRAST CLINICAL DATA:  Acute abdominal pain  EXAM: CT ABDOMEN AND PELVIS WITH CONTRAST  TECHNIQUE: Multidetector CT imaging of the abdomen and pelvis was performed using the standard protocol following bolus administration of intravenous contrast.  RADIATION DOSE REDUCTION: This exam was performed according to the departmental dose-optimization program which includes automated exposure control, adjustment of the mA and/or kV according to patient  size and/or use of iterative reconstruction technique.  CONTRAST:  OMNIPAQUE  IOHEXOL  300 MG/ML  SOLN  COMPARISON:  02/07/2023  FINDINGS: Lower chest: No acute pleural or parenchymal lung disease.  Hepatobiliary: Stable benign 3.1 cm hemangioma posterior right lobe liver. No other focal liver abnormalities. Prior cholecystectomy. No biliary duct dilation.  Pancreas: Unremarkable. No pancreatic ductal dilatation  or surrounding inflammatory changes.  Spleen: Normal in size without focal abnormality.  Adrenals/Urinary Tract: Adrenal glands are unremarkable. Kidneys are normal, without renal calculi, focal lesion, or hydronephrosis. Bladder is unremarkable.  Stomach/Bowel: No bowel obstruction or ileus. Normal appendix central upper abdomen. No bowel wall thickening or inflammatory change. Continued submucosal fatty infiltration of the proximal colon may suggest sequela of chronic inflammation.  Vascular/Lymphatic: No significant vascular findings are present. Stable borderline enlarged bilateral external iliac chain lymph nodes, nonspecific.  Reproductive: Uterus and bilateral adnexa are unremarkable.  Other: No free fluid or free intraperitoneal gas. There is a large midline ventral hernia containing mesenteric fat and the majority of the ascending and proximal transverse colon. No bowel obstruction or incarceration.  Musculoskeletal: No acute or destructive bony abnormalities. Reconstructed images demonstrate no additional findings.  IMPRESSION: 1. Stable large midline ventral hernia containing the proximal colon and mesenteric fat. No obstruction or incarceration. 2. No acute intra-abdominal or intrapelvic process. 3. Stable submucosal fatty infiltration of the proximal colon suggestive of chronic inflammation. No acute inflammatory changes. 4. Stable borderline enlarged bilateral external iliac chain lymph nodes, nonspecific but likely reactive.  Electronically Signed   By: Ozell Daring M.D.   On: 06/27/2023 17:05  CLINICAL DATA:  Lumbar radiculopathy, symptoms persist with > 6 wks treatment   EXAM: MRI LUMBAR SPINE WITHOUT CONTRAST   TECHNIQUE: Multiplanar, multisequence MR imaging of the lumbar spine was performed. No intravenous contrast was administered.   COMPARISON:  05/25/2013   FINDINGS: Segmentation:  Normal.   Alignment:  Stable without significant  listhesis.   Vertebrae: Increased degenerative endplate marrow changes at L5-S1 eccentric to the left. No marrow edema. No suspicious lesion. There is a vertebral body hemangioma at L3.   Conus medullaris and cauda equina: Conus extends to the L1 level. Conus and cauda equina appear normal.   Paraspinal and other soft tissues: Unremarkable.   Disc levels:   L1-L2:  No stenosis.   L2-L3:  No stenosis.   L3-L4:  No stenosis.   L4-L5: Disc desiccation without disc height loss. Disc bulge with right foraminal annular fissure. No canal stenosis. Mild foraminal stenosis.   L5-S1: Disc desiccation with increased disc height loss. Disc bulge with endplate osteophytic ridging and superimposed right subarticular disc protrusion. No canal stenosis. Effacement of the right subarticular recess with compression of traversing S1 nerve root. Mild right and mild to moderate left foraminal stenosis.   IMPRESSION: Lower lumbar degenerative changes with progression since remote 2015 study. Most notably, there is a disc herniation at L5-S1 compressing the traversing right S1 nerve root.     Note: Reviewed        Physical Exam  Vitals: BP (!) 120/59 (BP Location: Right Arm, Patient Position: Sitting, Cuff Size: Large)   Pulse (!) 107   Temp (!) 97.5 F (36.4 C) (Temporal)   Resp 16   Ht 5' 6 (1.676 m)   Wt 205 lb (93 kg)   LMP 04/12/2023 (Approximate)   SpO2 100%   BMI 33.09 kg/m  BMI: Estimated body mass index is 33.09 kg/m as calculated from the following:   Height  as of this encounter: 5' 6 (1.676 m).   Weight as of this encounter: 205 lb (93 kg). Ideal: Ideal body weight: 59.3 kg (130 lb 11.7 oz) Adjusted ideal body weight: 72.8 kg (160 lb 7 oz) General appearance: Well nourished, well developed, and well hydrated. In no apparent acute distress Mental status: Alert, oriented x 3 (person, place, & time)       Respiratory: No evidence of acute respiratory distress Eyes:  PERLA  Lumbar Spine Area Exam  Skin & Axial Inspection: No masses, redness, or swelling Alignment: Symmetrical Functional ROM: Pain restricted ROM affecting primarily the right?eft Stability: No instability detected Muscle Tone/Strength: Functionally intact. No obvious neuro-muscular anomalies detected. Sensory (Neurological): Dermatomal pain pattern Palpation: No palpable anomalies       Provocative Tests: Hyperextension/rotation test: deferred today       Lumbar quadrant test (Kemp's test): (+) on the right?left  for foraminal stenosis   Gait & Posture Assessment  Ambulation: Unassisted Gait: Relatively normal for age and body habitus Posture: WNL  Lower Extremity Exam      Side: Right lower extremity   Side: Left lower extremity  Stability: No instability observed           Stability: No instability observed          Skin & Extremity Inspection: Skin color, temperature, and hair growth are WNL. No peripheral edema or cyanosis. No masses, redness, swelling, asymmetry, or associated skin lesions. No contractures.   Skin & Extremity Inspection: Skin color, temperature, and hair growth are WNL. No peripheral edema or cyanosis. No masses, redness, swelling, asymmetry, or associated skin lesions. No contractures.  Functional ROM: Pain restricted ROM for all joints of the lower extremity           Functional ROM: similar to right               Muscle Tone/Strength: Functionally intact. No obvious neuro-muscular anomalies detected.   Muscle Tone/Strength: Functionally intact. No obvious neuro-muscular anomalies detected.  Sensory (Neurological): Dermatomal pain pattern         Sensory (Neurological): dermatomal      DTR: Patellar: deferred today Achilles: deferred today Plantar: deferred today   DTR: Patellar: deferred today Achilles: deferred today Plantar: deferred today  Palpation: No palpable anomalies   Palpation: No palpable anomalies     Assessment   Diagnosis  1. Lumbar  radiculopathy   2. Chronic radicular lumbar pain   3. Chronic pain syndrome      Updated Problems: No problems updated.  Plan of Care  Assessment and Plan    Bilateral lumbosacral radiculopathy (L5 and S1)   Bilateral lumbosacral radiculopathy affects both legs, initially diagnosed in March 2025. Symptoms include pain radiating down both legs to the soles of the feet, described as wearing 'porcupine shoes', with occasional sharp pains in random areas. Initially affected only the right leg but has progressed to both legs. No history of trauma or spine surgery. Managed with baclofen  and gabapentin  600 mg at night. Completed physical therapy from May 14th to June 26th, 2025. Smokes two packs per day, no pulmonary issues, and not on anticoagulants. Perform bilateral L5 and S1 epidural injections due to bilateral symptoms. MRI reveals significant disc herniation impacting the S1 nerve, with potential L5 nerve involvement. The goal is to avoid surgery through conservative management.        Pharmacotherapy (Medications Ordered): No orders of the defined types were placed in this encounter.  Orders:  Orders Placed  This Encounter  Procedures   Lumbar Transforaminal Epidural    Standing Status:   Future    Expected Date:   11/01/2023    Expiration Date:   10/17/2024    Scheduling Instructions:     Laterality: Bilateral     Level(s): L5 and S1           Sedation: IV Versed      Timeframe: As soon as schedule allows.    Where will this procedure be performed?:   [facility]    Return in about 2 weeks (around 11/01/2023) for B/L L5 and S1 TF ESI, in clinic (PO Valium 5mg ).    Recent Visits No visits were found meeting these conditions. Showing recent visits within past 90 days and meeting all other requirements Today's Visits Date Type Provider Dept  10/18/23 Office Visit Marcelino Nurse, MD [facility]  Showing today's visits and meeting all other requirements Future  Appointments No visits were found meeting these conditions. Showing future appointments within next 90 days and meeting all other requirements  I discussed the assessment and treatment plan with the patient. The patient was provided an opportunity to ask questions and all were answered. The patient agreed with the plan and demonstrated an understanding of the instructions.  Patient advised to call back or seek an in-person evaluation if the symptoms or condition worsens.  I personally spent a total of in the care of the patient today including preparing to see the patient, getting/reviewing separately obtained history, performing a medically appropriate exam/evaluation, counseling and educating, placing orders, documenting clinical information in the EHR, and independently interpreting results.   Note by: Nurse Marcelino, MD (TTS and AI technology used. I apologize for any typographical errors that were not detected and corrected.) Date: 10/18/2023; Time: 9:17 AM

## 2023-10-21 NOTE — Progress Notes (Deleted)
 Referring Physician:  Orlean Alan HERO, FNP [address] [place],  [place] [address]  Primary Physician:  Orlean Alan HERO, FNP  History of Present Illness: [name] has a history of hypothyroidism, prediabetes, lymphedema of lower extremities, and obesity.   Last seen by me on 03/16/23 for for right sided back and leg pain. She has known DDD L4-S1, worse at L5-S1 with slight slip. Also with mild bilateral foraminal stenosis L4-L5 and right disc L5-S1 that compresses S1 nerve with mild right and mild/moderate left foraminal stenosis.   She started PT at [facility] on 05/25/23. She was discharged on 07/07/23 due to upcoming surgery.   She has repair of recurrent ventral hernia on 07/14/23 by Dr. Jordis.   She is here for follow up.   No improvement at all with PT. She continues with constant right sided LBP with right lateral/posterior leg pain to her foot. No left leg pain. She has numbness and tingling in her right foot. Pain is worse with bending and staying in any prolonged position.   She was 233 at last visit, she has lost 6 more pounds.   She is taking baclofen  and neurontin . These help very little.   She smokes 2 PPD x 15 years. She is working on quitting- it is not going well.   Bowel/Bladder Dysfunction: none  Conservative measures:  Physical therapy: has not participated in PT Multimodal medical therapy including regular antiinflammatories:  Gabapentin , Prednisone  Injections: no epidural steroid injections  Past Surgery: no spinal surgeries  Karen Hill has no symptoms of cervical myelopathy.  The symptoms are causing a significant impact on the patient's life.   Review of Systems:  A 10 point review of systems is negative, except for the pertinent positives and negatives detailed in the HPI.  Past Medical History: Past Medical History:  Diagnosis Date   Dyspnea    GERD (gastroesophageal reflux disease)    Hypothyroidism    Liver hemangioma     Lymphedema    Sleep apnea    no cpap   Thyroid disease     Past Surgical History: Past Surgical History:  Procedure Laterality Date   CESAREAN SECTION  2014   CHOLECYSTECTOMY  2005   COLONOSCOPY WITH PROPOFOL  N/A 03/09/2023   Procedure: COLONOSCOPY WITH PROPOFOL ;  Surgeon: Therisa Bi, MD;  Location: [facility] END[facility]COPY;  Service: Gastroenterology;  Laterality: N/A;   INSERTION OF MESH  07/14/2023   Procedure: INSERTION OF MESH;  Surgeon: Jordis Laneta FALCON, MD;  Location: [facility];  Service: General;;   MANDIBLE FRACTURE SURGERY     MULTIPLE TOOTH EXTRACTIONS     TUBAL LIGATION  2014   XI ROBOTIC ASSISTED VENTRAL HERNIA N/A 07/14/2023   Procedure: REPAIR, HERNIA, VENTRAL, ROBOT-ASSISTED CONVERTED TO OPEN PROCEDURE;  Surgeon: Jordis Laneta FALCON, MD;  Location: [facility];  Service: General;  Laterality: N/A;    Allergies: Allergies as of 10/25/2023 - Review Complete 10/18/2023  Allergen Reaction Noted   Penicillin g Hives and Itching 11/08/2013   Amoxicillin  04/01/2022   Shellfish allergy Nausea And Vomiting 03/12/2016    Medications: Outpatient Encounter Medications as of 10/25/2023  Medication Sig   albuterol  (PROAIR  HFA) 108 (90 Base) MCG/ACT inhaler Inhale 2 puffs into the lungs every 6 (six) hours as needed for wheezing or shortness of breath.   baclofen  (LIORESAL ) 10 MG tablet TAKE 1 TABLET BY MOUTH TWICE A DAY AS NEEDED FOR MUSCLE SPASM   Cholecalciferol (VITAMIN D3) 50 MCG (2000 UT) TABS Take  2,000 Units by mouth in the morning.   cyanocobalamin  (VITAMIN B12) 1000 MCG/ML injection INJECT 1 ML (1,000 MCG TOTAL) INTO THE MUSCLE EVERY 14 (FOURTEEN) DAYS.   ferrous sulfate 325 (65 FE) MG tablet Take 325 mg by mouth in the morning.   gabapentin  (NEURONTIN ) 300 MG capsule TAKE 1 CAPSULE BY MOUTH NIGHTLY AT BEDTIME FOR 7 DAYS, INCREASE TO 2 CAPS AT BEDTIME FOR NEUROPATHY, APPT TIME AND LABS PLEASE (Patient taking differently: Take 600 mg by mouth daily.)   lansoprazole  (PREVACID ) 30 MG capsule  Take 1 capsule (30 mg total) by mouth daily at 12 noon.   levothyroxine  (SYNTHROID ) 125 MCG tablet TAKE 1 TABLET BY MOUTH EVERY DAY BEFORE BREAKFAST   linaclotide  (LINZESS ) 145 MCG CAPS capsule Take 1 capsule (145 mcg total) by mouth daily before breakfast.   methylPREDNISolone  (MEDROL  DOSEPAK) 4 MG TBPK tablet Use as directed x 6 days. (Patient not taking: Reported on 10/18/2023)   oxyCODONE -acetaminophen  (PERCOCET/ROXICET) 5-325 MG tablet Take 1 tablet by mouth every 4 (four) hours as needed for moderate pain (pain score 4-6) (reports taking 1/d).   rosuvastatin  (CRESTOR ) 5 MG tablet TAKE 1 TABLET (5 MG TOTAL) BY MOUTH DAILY.   Semaglutide -Weight Management (WEGOVY ) 1 MG/0.5ML SOAJ INJECT 1MG  INTO THE SKIN ONCE A WEEK (Patient taking differently: Inject 1 mg into the skin every Saturday.)   Syringe/Needle, Disp, (SYRINGE 3CC/20GX1) 20G X 1 3 ML MISC 1 each by Does not apply route every 14 (fourteen) days. For use with B12 injections   No facility-administered encounter medications on file as of 10/25/2023.    Social History: Social History   Tobacco Use   Smoking status: Every Day    Current packs/day: 1.00    Average packs/day: 1 pack/day for 10.0 years (10.0 ttl pk-yrs)    Types: Cigarettes    Passive exposure: Past   Smokeless tobacco: Never  Vaping Use   Vaping status: Former  Substance Use Topics   Alcohol use: Yes    Comment: rare   Drug use: No    Family Medical History: Family History  Problem Relation Age of Onset   Diabetes Mother    Hypertension Father    Breast cancer Neg Hx     Physical Examination: There were no vitals filed for this visit.    Awake, alert, oriented to person, place, and time.  Speech is clear and fluent. Fund of knowledge is appropriate.   Cranial Nerves: Pupils equal round and reactive to light.  Facial tone is symmetric.    She has right sided lower lumbar tenderness.   No abnormal lesions on exposed skin.   Strength: Side  Iliopsoas Quads Hamstring PF DF EHL  R 5 5 5 5 5 5   L 5 5 5 5 5 5      Clonus is not present.   Bilateral lower extremity sensation is intact to light touch.     Has known lymphedema in both legs.   Gait is slow.   Medical Decision Making  Imaging: None   Assessment and Plan: She had no improvement with PT. She continues with constant right sided LBP with right lateral/posterior leg pain to her foot. No left leg pain. She has numbness and tingling in her right foot.   She has known DDD L4-S1, worse at L5-S1 with slight slip. Alos with mild bilateral foraminal stenosis L4-L5 and right disc L5-S1 that compresses S1 nerve with mild right and mild/moderate left foraminal stenosis.   Treatment options discussed with patient  and following plan made:   - Referral back to Dr. Marcelino to revisit injections. They were not approved previously as she did not complete PT.  - Smoking cessation discussed and encouraged. She is up to 2 PPD.  - She is doing great on weight loss. She will continue to work on this.  - She is not interested in any surgery options.  - She will follow up with me in 2 months and prn.   I spent a total of 25 minutes in face-to-face and non-face-to-face activities related to this patient's care today including review of outside records, review of imaging, review of symptoms, physical exam, discussion of differential diagnosis, discussion of treatment options, and documentation.   Glade Boys PA-C Dept. of Neurosurgery

## 2023-10-25 ENCOUNTER — Ambulatory Visit: Admitting: Orthopedic Surgery

## 2023-10-25 ENCOUNTER — Encounter: Payer: Self-pay | Admitting: Student in an Organized Health Care Education/Training Program

## 2023-10-26 ENCOUNTER — Ambulatory Visit: Payer: Self-pay | Admitting: Family

## 2023-10-26 ENCOUNTER — Ambulatory Visit: Admitting: Physician Assistant

## 2023-10-26 ENCOUNTER — Other Ambulatory Visit: Payer: Self-pay

## 2023-10-26 ENCOUNTER — Emergency Department

## 2023-10-26 ENCOUNTER — Emergency Department
Admission: EM | Admit: 2023-10-26 | Discharge: 2023-10-26 | Disposition: A | Discharge status: Home / Self Care | Attending: Emergency Medicine | Admitting: Emergency Medicine

## 2023-10-26 DIAGNOSIS — W01198A Fall on same level from slipping, tripping and stumbling with subsequent striking against other object, initial encounter: Secondary | ICD-10-CM | POA: Diagnosis not present

## 2023-10-26 DIAGNOSIS — S82892A Other fracture of left lower leg, initial encounter for closed fracture: Secondary | ICD-10-CM | POA: Diagnosis not present

## 2023-10-26 DIAGNOSIS — S99912A Unspecified injury of left ankle, initial encounter: Secondary | ICD-10-CM | POA: Diagnosis present

## 2023-10-26 DIAGNOSIS — E039 Hypothyroidism, unspecified: Secondary | ICD-10-CM | POA: Insufficient documentation

## 2023-10-26 MED ORDER — OXYCODONE HCL 5 MG PO TABS: 5.0000 mg | ORAL_TABLET | Freq: Three times a day (TID) | ORAL | 0 refills | Status: DC | PRN

## 2023-10-26 NOTE — Discharge Instructions (Addendum)
 Follow-up with podiatry.  Please call them for an appointment.  Do not remove the splint until seen by podiatry.  Use the walker to bear weight as tolerated.  Elevate and ice the left ankle.  Pain medication as needed.  If you are using the narcotic pain medication please take a stool softener so you will not become constipated.  You may also take Tylenol  and ibuprofen with this pain medication

## 2023-10-26 NOTE — ED Provider Notes (Signed)
 [facility] Provider Note    Event Date/Time   First MD Initiated Contact with Patient 10/26/23 [phone]     (approximate)   History   Ankle Pain   HPI  Karen Hill is a 48 y.o. female history of lymphedema, hypothyroidism presents emergency department with left ankle pain.  Patient states she had taken some pain medication, got up in the [place] to go to the bathroom and fell asleep on the toilet.  States then fell forward hit her head and hurt her ankle.  Denies any problems with the head injury.  No neck pain.  Just pain in the left ankle.      Physical Exam   Triage Vital Signs: ED Triage Vitals  Encounter Vitals Group     BP 10/26/23 0829 120/77     Girls Systolic BP Percentile --      Girls Diastolic BP Percentile --      Boys Systolic BP Percentile --      Boys Diastolic BP Percentile --      Pulse Rate 10/26/23 0829 (!) 104     Resp 10/26/23 0829 20     Temp 10/26/23 0835 98.2 F (36.8 C)     Temp Source 10/26/23 0835 Oral     SpO2 10/26/23 0829 96 %     Weight 10/26/23 0834 205 lb 0.4 oz (93 kg)     Height 10/26/23 0834 5' 6 (1.676 m)     Head Circumference --      Peak Flow --      Pain Score 10/26/23 0830 8     Pain Loc --      Pain Education --      Exclude from Growth Chart --     Most recent vital signs: Vitals:   10/26/23 0829 10/26/23 0835  BP: 120/77   Pulse: (!) 104   Resp: 20   Temp:  98.2 F (36.8 C)  SpO2: 96%      General: Awake, no distress.   CV:  Good peripheral perfusion.  Resp:  Normal effort. Abd:  No distention.   Other:  Left ankle with swelling, tenderness and bruising along the medial aspect, good range of motion, neurovascular intact   ED Results / Procedures / Treatments   Labs (all labs ordered are listed, but only abnormal results are displayed) Labs Reviewed - No data to display   EKG     RADIOLOGY X-ray left ankle    PROCEDURES:   Procedures  Critical Care:   no Chief Complaint  Patient presents with   Ankle Pain      MEDICATIONS ORDERED IN ED: Medications - No data to display   IMPRESSION / MDM / ASSESSMENT AND PLAN / ED COURSE  I reviewed the triage vital signs and the nursing notes.                              Differential diagnosis includes, but is not limited to, sprain, fracture, contusion  Patient's presentation is most consistent with acute illness/injury   X-ray of the left ankle shows a medial distal tibia fracture.  This was independently reviewed and interpreted by me.  Radiology read pending  Posterior OCL applied by nursing staff.  I did order a walker but we are out of walkers here in the emergency department.  Will give the patient a DME order for an at home walker.  Patient  is follow-up with podiatry.  Return emergency department worsening.  She is in agreement treatment plan.  Given a prescription for oxycodone .  Instructed take Tylenol  IV for pain oxycodone .  Elevate and ice.  She is in agreement treatment plan.  Discharged stable condition.     FINAL CLINICAL IMPRESSION(S) / ED DIAGNOSES   Final diagnoses:  Closed fracture of left ankle, initial encounter     Rx / DC Orders   ED Discharge Orders          Ordered    oxyCODONE  (ROXICODONE ) 5 MG immediate release tablet  Every 8 hours PRN        10/26/23 0858    For home use only DME 4 wheeled rolling walker with seat        10/26/23 0903             Note:  This document was prepared using Dragon voice recognition software and may include unintentional dictation errors.    Gasper Devere ORN, PA-C 10/26/23 9093    Arlander Charleston, MD 10/26/23 [phone]

## 2023-10-26 NOTE — ED Triage Notes (Signed)
 Pt to ED for L ankle injury about 1 week ago. Pt states she took one of her husband's pain meds for her chronic leg pain and then that night she got up to void and fell asleep on the toilet and fell off and injured ankle.

## 2023-10-26 NOTE — Progress Notes (Signed)
 Patient notified

## 2023-10-31 ENCOUNTER — Telehealth: Payer: Self-pay

## 2023-10-31 ENCOUNTER — Ambulatory Visit: Admitting: Podiatry

## 2023-10-31 NOTE — Telephone Encounter (Signed)
 Insurance denied auth for TFESI. See below  Notes from the doctor that say your pain score is at least a 6 and notes to say what things you are unable to do because of your pain.  Her pain score is charted at a 5. They have denied due to this. I will call the patient and instruct her on how to appeal or have her come in to sign the appeal form for us  to appeal.

## 2023-10-31 NOTE — Telephone Encounter (Signed)
 Patient called stating that she would like a referral to Dr Randee office states that she's currently at [facility] pain clinic and feels like she's getting the run around please advise

## 2023-11-01 ENCOUNTER — Telehealth (INDEPENDENT_AMBULATORY_CARE_PROVIDER_SITE_OTHER): Payer: Self-pay

## 2023-11-01 ENCOUNTER — Ambulatory Visit: Admitting: Podiatry

## 2023-11-01 NOTE — Telephone Encounter (Signed)
 Patient called in reference to appointment on 11/08/23 for bilateral lower extremity venous reflux study stating she broke her L ankle and was going to be put into a cast sometime this week, she was concerned if she could still have the study with a cast. I spoke to [place] in ultrasound and she stated she should be fine to continue with the study. I made patient aware at this time that she could still keep her appointment, but if anything changed when she went to the doctor as far as the cast to give the office a call.

## 2023-11-02 ENCOUNTER — Ambulatory Visit: Admitting: Podiatry

## 2023-11-02 ENCOUNTER — Ambulatory Visit (INDEPENDENT_AMBULATORY_CARE_PROVIDER_SITE_OTHER)

## 2023-11-02 ENCOUNTER — Telehealth: Payer: Self-pay

## 2023-11-02 VITALS — Ht 66.0 in | Wt 205.0 lb

## 2023-11-02 DIAGNOSIS — S82892A Other fracture of left lower leg, initial encounter for closed fracture: Secondary | ICD-10-CM

## 2023-11-02 NOTE — Progress Notes (Signed)
 Referral faxed to [facility] [phone] Fax.

## 2023-11-02 NOTE — Progress Notes (Signed)
 Subjective:  Patient ID: Karen Hill, female    DOB: July 13, 1975,  MRN: [id number]  Chief Complaint  Patient presents with   Fracture    RM 2 Seen ER 10/26/23 ---NP-Closed fracture of left ankle. Swelling and bruising of the left ankle.    Discussed the use of AI scribe software for clinical note transcription with the patient, who gave verbal consent to proceed.  History of Present Illness  Karen Hill is a 48 year old female who presents with a broken ankle after a fall.  Two weeks ago, she fell asleep on the toilet and fell forward, injuring her ankle. Initially, she thought it was a sprain as it seemed to improve, but then the pain worsened. She visited the ER last week where it was confirmed that her ankle is broken.  She experiences very little pain on the outside of the ankle, but there is pain under the ankle.  Socially, she lives with her completely disabled husband and an eleven-year-old autistic daughter, for whom she is the primary caregiver. She is responsible for driving her daughter to school and managing household tasks, which may be challenging given her current non-weight bearing status.  She smokes about a pack of cigarettes a day and is not diabetic. She has not seen her primary care doctor in a while, although she usually visits every three months.      Objective:    Physical Exam VASCULAR: DP and PT pulse palpable. Foot is warm and well-perfused. Capillary fill time is brisk. DERMATOLOGIC: Normal skin turgor, texture, and temperature. No open lesions, rashes, or ulcerations. NEUROLOGIC: Normal sensation to light touch and pressure. No paresthesias. ORTHOPEDIC: Moderate to severe edema of the lower extremity. Ecchymosis on the plantar medial heel. Pain and tenderness of the medial malleolus, syndesmosis, and midfibula. Dorsal and medial displacement of the medial malleolus.   No images are attached to the encounter.    Results RADIOLOGY Ankle  X-ray: Dorsal and medial displacement of the medial malleolus with a fracture of approximately 3 mm. No associated fracture in the midshaft fibula. (11/02/2023)   Assessment:   1. Fracture, ankle, left, closed, initial encounter      Plan:  Patient was evaluated and treated and all questions answered.  Assessment and Plan Assessment & Plan Displaced fracture of left medial malleolus Displaced fracture of the left medial malleolus with tenderness over the syndesmosis, concerning for soft tissue injury and possible bimalleolar equivalent. The fracture occurred approximately two weeks ago after a fall. There is moderate to severe edema and ecchymosis on the plantar medial heel. The fracture is displaced dorsally and medially by approximately three millimeters. - Recommend surgery to prevent further displacement, reduce the risk of significant arthritis, and facilitate faster recovery. - Discussed risks and benefits of surgery, including potential complications such as infection, bone healing, and skin healing issues, especially considering her smoking status.  We also discussed that nonoperative treatment likely would increase risk of posttraumatic arthritis as well as worsening deformity of the ankle joint - Schedule surgery for right knee October 31 as an outpatient procedure at [facility]. - Order a CT scan stat of the ankle to be done before surgery. - Arrange for preoperative lab work and EKG at the hospital. - Contact primary care provider for medical clearance for surgery. - Order a knee scooter through [facility]. - Refer to home physical and occupational therapy due to her role as the primary caregiver for her family. - Advise non-weight bearing  status until surgery. - Prescribe a compression sleeve to reduce swelling. - Instruct to apply ice daily to the affected area. - Hold Wegovy  medication prior to surgery. - Had recent vitamin D  level which is 46  Tobacco  use Current smoker, approximately one pack per day. Smoking increases the risk of infection, bone healing, and skin healing complications post-surgery. - Advise smoking cessation to improve surgical outcomes and overall health.      No follow-ups on file.

## 2023-11-02 NOTE — Progress Notes (Signed)
 Faxed to [facility]  [phone].

## 2023-11-02 NOTE — Telephone Encounter (Signed)
 Due to delay in care the patients pain score is now at a 7. She desires to proceed with the lumbar transforaminal epidural as soon as possible. Pain is restricting patient from ADL's.

## 2023-11-05 ENCOUNTER — Encounter: Payer: Self-pay | Admitting: Family

## 2023-11-08 ENCOUNTER — Telehealth: Payer: Self-pay | Admitting: Podiatry

## 2023-11-08 ENCOUNTER — Ambulatory Visit (INDEPENDENT_AMBULATORY_CARE_PROVIDER_SITE_OTHER): Admitting: Nurse Practitioner

## 2023-11-08 ENCOUNTER — Encounter (INDEPENDENT_AMBULATORY_CARE_PROVIDER_SITE_OTHER)

## 2023-11-08 NOTE — Telephone Encounter (Signed)
 Called and scheduled patient for surgery this Friday 10/31. Patient not on any GLP1/blood thinners. Patient confirmed receipt of surgery bag. Passed along message from Dr. Silva that a CT had been ordered both verbally and via MyChart. Patient preferred pharmacy is set in chart.

## 2023-11-08 NOTE — Telephone Encounter (Signed)
 DOS- 11/11/2023  OPEN TREATMENT OF MEDIAL MALLEOLUS FX, INCLUDING INTERNAL FIXATION, WHEN PERFORMED LT- 72233 OPEN TREATMENT OF DISTAL TIBIOFIBULAR JOINT DISRUPTION INCLUDING INTERNAL FIXATION, WHEN PERFORMED LT- 27829  [facility] EFFECTIVE DATE- 03/12/2023  PER FAX RECEIVED FROM [facility], NO PRIOR AUTH IS REQUIRED FOR CPT CODE 72233 AND [phone]. DOCUMENTATION ATTACHED TO SURGERY PACKET.

## 2023-11-09 ENCOUNTER — Encounter
Admission: RE | Admit: 2023-11-09 | Discharge: 2023-11-09 | Disposition: A | Discharge status: Home / Self Care | Source: Ambulatory Visit | Attending: Podiatry | Admitting: Podiatry

## 2023-11-09 ENCOUNTER — Inpatient Hospital Stay: Admission: RE | Admit: 2023-11-09 | Source: Ambulatory Visit

## 2023-11-09 ENCOUNTER — Other Ambulatory Visit: Payer: Self-pay

## 2023-11-09 DIAGNOSIS — Z01818 Encounter for other preprocedural examination: Secondary | ICD-10-CM | POA: Insufficient documentation

## 2023-11-09 DIAGNOSIS — E039 Hypothyroidism, unspecified: Secondary | ICD-10-CM

## 2023-11-09 NOTE — Patient Instructions (Addendum)
 Your procedure is scheduled on: FRIDAY 11/11/23 Report to the Registration Desk on the 1st floor of the Medical Mall. To find out your arrival time, please call [phone] between 1PM - 3PM on: THURSDAY 11/10/23 If your arrival time is 6:00 am, do not arrive before that time as the Medical Mall entrance doors do not open until 6:00 am.  REMEMBER: Instructions that are not followed completely may result in serious medical risk, up to and including death; or upon the discretion of your surgeon and anesthesiologist your surgery may need to be rescheduled.  Do not eat food after midnight the night before surgery.  No gum chewing or hard candies.  You may however, drink WATER up to 2 hours before you are scheduled to arrive for your surgery. Do not drink anything within 2 hours of your scheduled arrival time.  In addition, your doctor has ordered for you to drink the provided:  Ensure Pre-Surgery Clear Carbohydrate Drink  Drinking this carbohydrate drink up to two hours before surgery helps to reduce insulin resistance and improve patient outcomes. Please complete drinking 2 hours before scheduled arrival time.  One week prior to surgery: Stop Anti-inflammatories (NSAIDS) such as Advil, Aleve, Ibuprofen, Motrin, Naproxen, Naprosyn and Aspirin based products such as Excedrin, Goody's Powder, BC Powder. Stop ANY OVER THE COUNTER supplements until after surgery.  You may however, continue to take Tylenol  if needed for pain up until the day of surgery.  Continue taking all of your other prescription medications up until the day of surgery.  ON THE DAY OF SURGERY ONLY TAKE THESE MEDICATIONS WITH SIPS OF WATER:  levothyroxine  (SYNTHROID )  lansoprazole  (PREVACID )  gabapentin  (NEURONTIN )  rosuvastatin  (CRESTOR )   Use inhalers on the day of surgery and bring to the hospital.  No Alcohol for 24 hours before or after surgery.  No Smoking including e-cigarettes for 24 hours before surgery.   No chewable tobacco products for at least 6 hours before surgery.  No nicotine  patches on the day of surgery.  Do not use any recreational drugs for at least a week (preferably 2 weeks) before your surgery.  Please be advised that the combination of cocaine and anesthesia may have negative outcomes, up to and including death. If you test positive for cocaine, your surgery will be cancelled.  On the morning of surgery brush your teeth with toothpaste and water, you may rinse your mouth with mouthwash if you wish. Do not swallow any toothpaste or mouthwash.  Use CHG Soap or wipes as directed on instruction sheet.  Do not wearlotions, powders, or perfumes, jewelry, make-up, hairpins, clips or nail polish.  For welded (permanent) jewelry: bracelets, anklets, waist bands, etc.  Please have this removed prior to surgery.  If it is not removed, there is a chance that hospital personnel will need to cut it off on the day of surgery.  Do not shave body hair from the neck down 48 hours before surgery.  Contact lenses, hearing aids and dentures may not be worn into surgery.  Do not bring valuables to the hospital. [facility] is not responsible for any missing/lost belongings or valuables.   Notify your doctor if there is any change in your medical condition (cold, fever, infection).  Wear comfortable clothing (specific to your surgery type) to the hospital.  After surgery, you can help prevent lung complications by doing breathing exercises.  Take deep breaths and cough every 1-2 hours. Your doctor may order a device called an Facilities Manager to  help you take deep breaths.  When coughing or sneezing, hold a pillow firmly against your incision with both hands. This is called "splinting." Doing this helps protect your incision. It also decreases belly discomfort.  If you are being discharged the day of surgery, you will not be allowed to drive home. You will need a responsible individual  to drive you home and stay with you for 24 hours after surgery.   If you are taking public transportation, you will need to have a responsible individual with you.  Surgery Visitation Policy:  Patients having surgery or a procedure may have two visitors.  Children under the age of 61 must have an adult with them who is not the patient.   Please call the Pre-admissions Testing Dept. at [phone] if you have any questions about these instructions.  Merchandiser, Retail to address health-related social needs:  [email or web address]                                                                                                             Preparing for Surgery with CHLORHEXIDINE  GLUCONATE (CHG) Soap  Chlorhexidine  Gluconate (CHG) Soap  o An antiseptic cleaner that kills germs and bonds with the skin to continue killing germs even after washing  o Used for showering the night before surgery and morning of surgery  Before surgery, you can play an important role by reducing the number of germs on your skin.  CHG (Chlorhexidine  gluconate) soap is an antiseptic cleanser which kills germs and bonds with the skin to continue killing germs even after washing.  Please do not use if you have an allergy to CHG or antibacterial soaps. If your skin becomes reddened/irritated stop using the CHG.  1. Shower the NIGHT BEFORE SURGERY with CHG soap.   2. If you choose to wash your hair, wash your hair first as usual with your normal shampoo.  3. After shampooing, rinse your hair and body thoroughly to remove the shampoo.  4. Use CHG as you would any other liquid soap. You can apply CHG directly to the skin and wash gently with a clean washcloth.  5. Apply the CHG soap to your body only from the neck down. Do not use on open wounds or open sores. Avoid contact with your eyes, ears, mouth, and genitals (private parts). Wash face and genitals (private parts) with your normal  soap.  6. Wash thoroughly, paying special attention to the area where your surgery will be performed.  7. Thoroughly rinse your body with warm water.  8. Do not shower/wash with your normal soap after using and rinsing off the CHG soap.  9. Do not use lotions, oils, etc., after showering with CHG.  10. Pat yourself dry with a clean towel.    11. Wear clean pajamas to bed the night before surgery.  12. Place clean sheets on your bed the night of your shower and do not sleep with pets.  13. Do not apply any deodorants/lotions/powders.  14. Please wear clean clothes to the hospital.  15. Remember to brush your teeth with your regular toothpaste.

## 2023-11-10 ENCOUNTER — Encounter: Payer: Self-pay | Admitting: Urgent Care

## 2023-11-10 ENCOUNTER — Encounter
Admission: RE | Admit: 2023-11-10 | Discharge: 2023-11-10 | Disposition: A | Discharge status: Home / Self Care | Source: Ambulatory Visit | Attending: Podiatry | Admitting: Podiatry

## 2023-11-10 ENCOUNTER — Ambulatory Visit: Payer: Self-pay | Admitting: Urgent Care

## 2023-11-10 ENCOUNTER — Ambulatory Visit
Admission: RE | Admit: 2023-11-10 | Discharge: 2023-11-10 | Disposition: A | Discharge status: Home / Self Care | Source: Ambulatory Visit | Attending: Podiatry | Admitting: Podiatry

## 2023-11-10 DIAGNOSIS — S82892A Other fracture of left lower leg, initial encounter for closed fracture: Secondary | ICD-10-CM

## 2023-11-10 DIAGNOSIS — E876 Hypokalemia: Secondary | ICD-10-CM | POA: Insufficient documentation

## 2023-11-10 DIAGNOSIS — E039 Hypothyroidism, unspecified: Secondary | ICD-10-CM | POA: Insufficient documentation

## 2023-11-10 DIAGNOSIS — Z01812 Encounter for preprocedural laboratory examination: Secondary | ICD-10-CM

## 2023-11-10 DIAGNOSIS — Z01818 Encounter for other preprocedural examination: Secondary | ICD-10-CM | POA: Insufficient documentation

## 2023-11-10 LAB — BASIC METABOLIC PANEL WITH GFR
Anion gap: 11 (ref 5–15)
BUN: 10 mg/dL (ref 6–20)
CO2: 31 mmol/L (ref 22–32)
Calcium: 8.9 mg/dL (ref 8.9–10.3)
Chloride: 99 mmol/L (ref 98–111)
Creatinine, Ser: 0.55 mg/dL (ref 0.44–1.00)
GFR, Estimated: >60 mL/min (ref >60)
Glucose, Bld: 78 mg/dL (ref 70–99)
Potassium: 3 mmol/L — ABNORMAL LOW (ref 3.5–5.1)
Sodium: 141 mmol/L (ref 135–145)

## 2023-11-10 LAB — MAGNESIUM: Magnesium: 2 mg/dL (ref 1.7–2.4)

## 2023-11-10 MED ORDER — POTASSIUM CHLORIDE CRYS ER 20 MEQ PO TBCR: EXTENDED_RELEASE_TABLET | ORAL | 0 refills | Status: AC

## 2023-11-10 NOTE — Discharge Instructions (Signed)
 Post-Surgery Instructions  1. If you are recuperating from surgery anywhere other than home, please be sure to leave us  a number where you can be reached. 2. Go directly home and rest. 3. The keep operated foot (or feet) elevated six inches above the hip when sitting or lying down. 4. Support the elevated foot and leg with pillows under the calf. DO NOT PLACE PILLOWS UNDER THE KNEE. 5. DO NOT REMOVE or get your bandages wet. This will increase your chances of getting an infection. 6. Do not remove the splint unless instructed by your doctor's office 7. A limited amount of pain and swelling may occur. The skin may take on a bruised appearance. This is no cause for alarm. 8. For slight pain and swelling, apply an ice pack behind the knee for 15 minutes every hour. Continue icing until seen in the office. DO NOT apply any form of heat to the area. 9. Have prescription(s) filled immediately and take as directed. 10. Drink lots of liquids, water, and juice. 11. CALL THE OFFICE IMMEDIATELY IF: a. Bleeding continues b. Pain increases and/or does not respond to medication c. Bandage or cast appears too tight d. Any liquids (water, coffee, etc.) have spilled on your bandages. e. Tripping, falling, or stubbing the surgical foot f. If your temperature rises above 101 g. If you have ANY questions at all 12. Please use the crutches, knee scooter, or walker you have prescribed, rented, or purchased. If you are non-weight bearing DO NOT put weight on the operated foot for _________ days. If you are weight-bearing, follow your physician's instructions. You are expected to be:  ? non-weight bearing 13. Special Instructions: _____________________________________________________________ _________________________________________________________________________________ _________________________________________________________________________________  14. Your next appointment is: 11/16/2023 10:30 AM    If you need to reach the nurse for any reason, please call: [place]: [phone] [place]: [phone] [place]: [phone]

## 2023-11-10 NOTE — Telephone Encounter (Deleted)
**  CPT CODES CHANGED**  TREATMENT OF ANKLE FRACTURE LT- [phone] OPEN TREATMENT OF DISTAL TIBIOFIBULAR JOINT DISRUPTION INCLUDING INTERNAL FIXATION, WHEN PERFORMED LT- 27829   [facility] EFFECTIVE DATE- 03/12/2023   PER FAXES RECEIVED FROM [facility], NO PRIOR AUTHS ARE REQUIRED FOR CPT CODES 72185 AND [phone]. DOCUMENTATION ATTACHED TO SURGERY PACKET.

## 2023-11-10 NOTE — Progress Notes (Signed)
 [facility] Perioperative Services: Pre-Admission/Anesthesia Testing  Abnormal Lab Notification and Treatment Plan of Care   Date: 11/10/23  Name: Karen Hill DOB: [birth year] MRN:   [id number]  Re: Abnormal labs noted during PAT appointment   Notified:  Provider Name Provider Role Notification Mode  Silva Cornet, DPM Podiatry (Surgeon) Routed and/or faxed via RANELL Arrant, Alan HERO, FNP Primary Care Provider Routed and/or faxed via [facility]   Clinical Information and Notes:  ABNORMAL LAB VALUE(S): Lab Results  Component Value Date   K 3.0 (L) 11/10/2023   Karen Hill is scheduled for an elective OPEN REDUCTION INTERNAL FIXATION (ORIF) ANKLE FRACTURE (Left: Ankle) REPAIR, SYNDESMOSIS, ANKLE (Left) on 11/11/2023. In review of her medication reconciliation, it is noted that the patient is not taking prescribed diuretic medications.   Please note, in efforts to promote a safe and effective anesthetic course, per current guidelines/standards set by the [facility] anesthesia team, the minimal acceptable K+ level for the patient to proceed with general anesthesia is 3.0 mmol/L. With that being said, if the patient drops any lower, her elective procedure will need to be postponed until K+ is better optimized. In efforts to prevent case cancellation, and ultimately to promote the safety of this patient undergoing sedation/anesthesia, will make efforts to optimize pre-surgical K+ level allowing the surgical intervention to proceed as planned.    Impression and Plan:  Karen Hill found to be HYPOkalemic at 3.0 mmol/L on preoperative labs.   Mg level was added and found to be normal at: 2.0 mg/dL  She is on not on daily diuretic therapy. Discussed that in the absence of GI related symptoms (no diarrhea), this is likely a nutritionally mediated derangement. Patient denies regular use of laxative medications. Reviewed other potential insensible losses. Reviewed plans for  short term preoperative optimization as follows:   Meds ordered this encounter  Medications   potassium chloride SA (KLOR-CON M) 20 MEQ tablet    Sig: Take 2 tablets (40 mEq) today, then 2 tablets (40 mEq) prior to coming in for surgery tomorrow.  Follow-up with PCP for repeat labs.    Dispense:  4 tablet    Refill:  0    Please contact the patient as soon as it is available for pickup. Rx is for preoperative K+ optimization and needs to be started ASAP.   Encouraged patient to follow up with PCP about 2-3 weeks postoperatively to have labs rechecked to ensure that levels are remaining within normal range. Discussed nutritional intake of K+ rich foods as an adjunctive way to keep her K+ levels normal; list of K+ rich foods provided. Also mentioned ORS, however advised her not to rely solely on these drinks, as they are high in Na+.   Will send copy of this note to surgeon and PCP to make them aware of K+ level and plans for correction. Discussed that PCP may elect to add a daily K+ supplement if levels remain low on recheck, as patient has been hypokalemic in the past. Order entered to recheck K+ on the day of her surgery to ensure optimization. Wished patient the best of luck with her upcoming surgery and subsequent recovery. She was encouraged to return call to the PAT clinic, or to her surgeon's office, should any questions or concerns arise between now and the time of her surgery. Patient was appreciative of the care/concern expressed by PAT staff.   Encounter Diagnoses  Name Primary?   Pre-operative laboratory examination Yes  Hypokalemia    [name], MSN, APRN, FNP-C, [facility]  Perioperative Services Nurse Practitioner Phone: [phone] 11/10/23 1:46 PM  NOTE: This note has been prepared using Dragon dictation software. Despite my best ability to proofread, there is always the potential that unintentional transcriptional errors may still occur from this  process.

## 2023-11-10 NOTE — Progress Notes (Signed)
 Perioperative Services Pre-Admission/Anesthesia Testing   Date: 11/10/23 Name: Karen Hill MRN:   [id number]  Re: Consideration of preoperative prophylactic antibiotic change   Request sent to: Silva Juliene SAUNDERS, DPM (routed and/or faxed via [facility])  Planned Surgical Procedure(s):     Case: [id number] Date/Time: 11/11/23 1245   Procedures:      OPEN REDUCTION INTERNAL FIXATION (ORIF) ANKLE FRACTURE (Left: Ankle) - BLOCK,OPEN TREATMENT OF MEDIAL MALLEOLUS FRACTURE INCLUDING INTERNAL FIXATION WHEN PERFORMED     REPAIR, SYNDESMOSIS, ANKLE (Left) - OPEN TREATMENT OF DISTAL TIBIAL FIBULAR JOINT DISRUPTION, INCLUDING INTERNAL FIXATION WHEN PERFORMED   Anesthesia type: General   Pre-op diagnosis: FRACTURED OF THE ANKLE, LEFT CLOSED INITIAL ENCOUNTER   Location: [facility] OR ROOM 08 / [facility] ORS FOR ANESTHESIA GROUP   Surgeons: Silva Juliene SAUNDERS, DPM        Clinical Notes:  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   EMR review indicated that patient received PCN and/or cephalosporin in the past as follows: CEFAZOLIN  received in the past with no documented ADRs. Allergy section in EMR updated to reflect tolerance.    Screened as appropriate for cephalosporin use during medication reconciliation No immediate angioedema, dysphagia, SOB, anaphylaxis symptoms. No severe rash involving mucous membranes or skin necrosis. No hospital admissions related to side effects of PCN/cephalosporin use.  No documented reaction to PCN or cephalosporin in the last 10 years.  Request:  As an evidence based approach to reducing the rate of incidence for post-operative SSI and the development of MDROs, could an agent that allows for narrower antimicrobial coverage for preoperative prophylaxis in this patient's upcoming surgical course be considered?   Currently ordered preoperative prophylactic ABX: vancomycin.   Specifically  requesting change to cephalosporin (CEFAZOLIN ).   Drug of choice for many procedures; it is the most widely studied antimicrobial agent with proven efficacy for antimicrobial prophylaxis.   Desirable duration of action, spectrum of activity against organisms commonly encountered in surgery, and it has an excellent safety profile and low cost.   Active against streptococci, methicillin-susceptible staphylococci, and many gram-negative organisms.  Despite being a first-generation cephalosporin, cefazolin  is structurally different than other cephalosporins. Cefazolin  has two side chains (R1 and R2) that have structures that do not match those of any other FDA-approved ?-lactams or PCN.  True allergies to cefazolin  are extremely rare (<1%) and are usually specific for its side chains, not the shared core ring.   Please communicate decision with me and I will change the orders in Epic as per your direction.   Things to consider: Many patients report that they were allergic to PCN earlier in life, however this does not translate into a true lifelong allergy. Patients can lose sensitivity to specific IgE antibodies over time if PCN is avoided (Kleris & Lugar, 2019).  Penicillin allergies are reported by 8% to 15% of the [place]  population, but up to 95% of these allergies do not correspond to a true allergy when tested Mathias et al., 2022).   Cross-sensitivity between PCN and cephalosporins has been documented as being as high as 10%, however this estimation included data believed to have been collected in a setting where there was contamination. Newer data suggests that the prevalence of cross-sensitivity between PCN and cephalosporins is actually estimated to be closer to 1% (Hermanides et al., 2018).   Patients labeled as PCN allergic, whether they are truly allergic or not, have been found  to have inferior outcomes in terms of rates of serious infection, and these patients tend to have longer hospital  stays [facility] & Lugar, 2019).  Treatment related secondary infections, such as Clostridioides difficile, have been linked to the improper use of broad spectrum antibiotics in patients improperly labeled as PCN allergic (Kleris & Lugar, 2019).  Anaphylaxis from cephalosporins is rare and the evidence suggests that there is no increased risk of an anaphylactic type reaction when cephalosporins are used in a PCN allergic patient (Pichichero, 2006).  Citations: Hermanides J, Lemkes BA, Prins ONA Dose MW, Terreehorst I. Presumed ?-Lactam Allergy and Cross-reactivity in the Operating Theater: A Practical Approach. Anesthesiology. 2018 Aug;129(2):335-342. doi: 10.1097/ALN.0000000000002252. PMID: 70237819.  Kleris, R. S., & Lugar, P. L. (2019). Things We Do For No Reason: Failing to Question a Penicillin Allergy History. Journal of hospital medicine, 14(10), [phone]. Advance online publication. airportbarriers.com  Pichichero, M. E. (2006). Cephalosporins can be prescribed safely for penicillin-allergic patients. Journal of family medicine, 55(2), 106-112. Accessed: https://cdn.mdedge.com/files/s40fs-public/Document/September-2017/5502JFP_AppliedEvidence1.pdf  Mikki CANDIE Bethena KYM RONAL Mickey Beverley JUDITHANN DELENA., & Estelle, D. R. (2022). Understanding Penicillin Allergy, Cross-reactivity, and Antibiotic Selection in the Preoperative Setting. The Journal of the American Academy of Orthopaedic Surgeons, 30(1), e1-e5. Callrank.tn   [name], MSN, APRN, FNP-C, CEN [facility]  Perioperative Services Nurse Practitioner Phone: [phone] Fax: [phone] 11/10/23 1:30 PM  NOTE: This note has been prepared using Dragon dictation software. Despite my best ability to proofread, there is always the potential that unintentional transcriptional errors may still occur from this process.

## 2023-11-11 ENCOUNTER — Ambulatory Visit: Payer: Self-pay | Admitting: Urgent Care

## 2023-11-11 ENCOUNTER — Encounter
Admission: RE | Disposition: A | Discharge status: Home / Self Care | Payer: Self-pay | Source: Home / Self Care | Attending: Podiatry

## 2023-11-11 ENCOUNTER — Ambulatory Visit
Admission: RE | Admit: 2023-11-11 | Discharge: 2023-11-11 | Disposition: A | Discharge status: Home / Self Care | Attending: Podiatry | Admitting: Podiatry

## 2023-11-11 ENCOUNTER — Encounter: Payer: Self-pay | Admitting: Podiatry

## 2023-11-11 ENCOUNTER — Ambulatory Visit

## 2023-11-11 ENCOUNTER — Other Ambulatory Visit: Payer: Self-pay

## 2023-11-11 DIAGNOSIS — E876 Hypokalemia: Secondary | ICD-10-CM

## 2023-11-11 DIAGNOSIS — Z01812 Encounter for preprocedural laboratory examination: Secondary | ICD-10-CM

## 2023-11-11 DIAGNOSIS — S93432A Sprain of tibiofibular ligament of left ankle, initial encounter: Secondary | ICD-10-CM | POA: Insufficient documentation

## 2023-11-11 DIAGNOSIS — F17210 Nicotine dependence, cigarettes, uncomplicated: Secondary | ICD-10-CM | POA: Diagnosis not present

## 2023-11-11 DIAGNOSIS — X58XXXA Exposure to other specified factors, initial encounter: Secondary | ICD-10-CM | POA: Insufficient documentation

## 2023-11-11 DIAGNOSIS — S82842A Displaced bimalleolar fracture of left lower leg, initial encounter for closed fracture: Secondary | ICD-10-CM | POA: Diagnosis present

## 2023-11-11 DIAGNOSIS — Z01818 Encounter for other preprocedural examination: Secondary | ICD-10-CM

## 2023-11-11 DIAGNOSIS — R7989 Other specified abnormal findings of blood chemistry: Secondary | ICD-10-CM

## 2023-11-11 DIAGNOSIS — S82892A Other fracture of left lower leg, initial encounter for closed fracture: Secondary | ICD-10-CM

## 2023-11-11 HISTORY — PX: ORIF ANKLE FRACTURE: SHX5408

## 2023-11-11 HISTORY — PX: SYNDESMOSIS REPAIR: SHX5182

## 2023-11-11 LAB — HCG, QUANTITATIVE, PREGNANCY: hCG, Beta Chain, Quant, S: 5 m[IU]/mL — ABNORMAL HIGH (ref <5)

## 2023-11-11 LAB — POCT I-STAT, CHEM 8
BUN: 7 mg/dL (ref 6–20)
Calcium, Ion: 1.11 mmol/L — ABNORMAL LOW (ref 1.15–1.40)
Chloride: 101 mmol/L (ref 98–111)
Creatinine, Ser: 0.6 mg/dL (ref 0.44–1.00)
Glucose, Bld: 68 mg/dL — ABNORMAL LOW (ref 70–99)
HCT: 43 % (ref 36.0–46.0)
Hemoglobin: 14.6 g/dL (ref 12.0–15.0)
Potassium: 3.6 mmol/L (ref 3.5–5.1)
Sodium: 141 mmol/L (ref 135–145)
TCO2: 27 mmol/L (ref 22–32)

## 2023-11-11 LAB — POCT PREGNANCY, URINE: Preg Test, Ur: POSITIVE — AB

## 2023-11-11 SURGERY — OPEN REDUCTION INTERNAL FIXATION (ORIF) ANKLE FRACTURE
Anesthesia: General | Site: Ankle | Laterality: Left

## 2023-11-11 MED ORDER — PROPOFOL 500 MG/50ML IV EMUL
INTRAVENOUS | Status: DC | PRN
  Administered 2023-11-11: 145 ug/kg/min via INTRAVENOUS

## 2023-11-11 MED ORDER — MIDAZOLAM HCL 2 MG/2ML IJ SOLN
INTRAMUSCULAR | Status: AC
  Filled 2023-11-11: qty 2

## 2023-11-11 MED ORDER — OXYCODONE HCL 5 MG PO TABS: 5.0000 mg | ORAL_TABLET | Freq: Once | ORAL | Status: DC | PRN

## 2023-11-11 MED ORDER — LACTATED RINGERS IV SOLN
INTRAVENOUS | Status: DC
Start: 2023-11-11 — End: 2023-11-11

## 2023-11-11 MED ORDER — 0.9 % SODIUM CHLORIDE (POUR BTL) OPTIME
TOPICAL | Status: DC | PRN
  Administered 2023-11-11: 500 mL

## 2023-11-11 MED ORDER — CEFAZOLIN SODIUM-DEXTROSE 2-4 GM/100ML-% IV SOLN
INTRAVENOUS | Status: AC
  Filled 2023-11-11: qty 100

## 2023-11-11 MED ORDER — FENTANYL CITRATE (PF) 100 MCG/2ML IJ SOLN: 25.0000 ug | INTRAMUSCULAR | Status: DC | PRN

## 2023-11-11 MED ORDER — ACETAMINOPHEN 10 MG/ML IV SOLN
INTRAVENOUS | Status: DC | PRN
  Administered 2023-11-11: 1000 mg via INTRAVENOUS

## 2023-11-11 MED ORDER — BUPIVACAINE HCL (PF) 0.5 % IJ SOLN
INTRAMUSCULAR | Status: AC
  Filled 2023-11-11: qty 20

## 2023-11-11 MED ORDER — BUPIVACAINE LIPOSOME 1.3 % IJ SUSP
INTRAMUSCULAR | Status: AC
  Filled 2023-11-11: qty 20

## 2023-11-11 MED ORDER — ORAL CARE MOUTH RINSE: 15.0000 mL | Freq: Once | OROMUCOSAL | Status: AC

## 2023-11-11 MED ORDER — BUPIVACAINE-EPINEPHRINE (PF) 0.25% -1:200000 IJ SOLN
INTRAMUSCULAR | Status: AC
  Filled 2023-11-11: qty 30

## 2023-11-11 MED ORDER — BUPIVACAINE HCL (PF) 0.5 % IJ SOLN
INTRAMUSCULAR | Status: DC | PRN
  Administered 2023-11-11 (×2): 10 mL

## 2023-11-11 MED ORDER — MIDAZOLAM HCL (PF) 2 MG/2ML IJ SOLN
1.0000 mg | INTRAMUSCULAR | Status: AC | PRN
  Administered 2023-11-11 (×2): 1 mg via INTRAVENOUS

## 2023-11-11 MED ORDER — FENTANYL CITRATE (PF) 100 MCG/2ML IJ SOLN
INTRAMUSCULAR | Status: DC | PRN
  Administered 2023-11-11 (×4): 25 ug via INTRAVENOUS

## 2023-11-11 MED ORDER — CEFAZOLIN SODIUM-DEXTROSE 2-4 GM/100ML-% IV SOLN
2.0000 g | Freq: Once | INTRAVENOUS | Status: AC
  Administered 2023-11-11: 2 g via INTRAVENOUS

## 2023-11-11 MED ORDER — PROPOFOL 1000 MG/100ML IV EMUL
INTRAVENOUS | Status: AC
  Filled 2023-11-11: qty 100

## 2023-11-11 MED ORDER — VANCOMYCIN HCL IN DEXTROSE 1-5 GM/200ML-% IV SOLN
INTRAVENOUS | Status: AC
  Filled 2023-11-11: qty 200

## 2023-11-11 MED ORDER — OXYCODONE HCL 5 MG PO TABS: 5.0000 mg | ORAL_TABLET | ORAL | 0 refills | Status: DC | PRN

## 2023-11-11 MED ORDER — METOPROLOL TARTRATE 5 MG/5ML IV SOLN
INTRAVENOUS | Status: AC
  Filled 2023-11-11: qty 5

## 2023-11-11 MED ORDER — ALBUTEROL SULFATE HFA 108 (90 BASE) MCG/ACT IN AERS
INHALATION_SPRAY | RESPIRATORY_TRACT | Status: DC | PRN
Start: 2023-11-11 — End: 2023-11-11
  Administered 2023-11-11: 2 via RESPIRATORY_TRACT

## 2023-11-11 MED ORDER — ACETAMINOPHEN 10 MG/ML IV SOLN
INTRAVENOUS | Status: AC
  Filled 2023-11-11: qty 100

## 2023-11-11 MED ORDER — FENTANYL CITRATE (PF) 100 MCG/2ML IJ SOLN
INTRAMUSCULAR | Status: AC
  Filled 2023-11-11: qty 2

## 2023-11-11 MED ORDER — CHLORHEXIDINE GLUCONATE 0.12 % MT SOLN
OROMUCOSAL | Status: AC
Start: 2023-11-11 — End: 2023-11-11
  Filled 2023-11-11: qty 15

## 2023-11-11 MED ORDER — OXYCODONE HCL 5 MG/5ML PO SOLN: 5.0000 mg | Freq: Once | ORAL | Status: DC | PRN

## 2023-11-11 MED ORDER — BUPIVACAINE LIPOSOME 1.3 % IJ SUSP
INTRAMUSCULAR | Status: DC | PRN
Start: 2023-11-11 — End: 2023-11-11
  Administered 2023-11-11 (×2): 10 mL

## 2023-11-11 MED ORDER — ENOXAPARIN SODIUM 40 MG/0.4ML IJ SOSY: 40.0000 mg | PREFILLED_SYRINGE | INTRAMUSCULAR | 0 refills | Status: AC

## 2023-11-11 MED ORDER — ESMOLOL HCL 100 MG/10ML IV SOLN
INTRAVENOUS | Status: DC | PRN
  Administered 2023-11-11 (×3): 10 mg via INTRAVENOUS

## 2023-11-11 MED ORDER — METOPROLOL TARTRATE 5 MG/5ML IV SOLN
INTRAVENOUS | Status: DC | PRN
  Administered 2023-11-11: 2 mg via INTRAVENOUS

## 2023-11-11 MED ORDER — VANCOMYCIN HCL 1500 MG/300ML IV SOLN
1500.0000 mg | Freq: Once | INTRAVENOUS | Status: DC
  Filled 2023-11-11: qty 300

## 2023-11-11 MED ORDER — PROPOFOL 10 MG/ML IV BOLUS
INTRAVENOUS | Status: DC | PRN
  Administered 2023-11-11: 30 mg via INTRAVENOUS
  Administered 2023-11-11: 60 mg via INTRAVENOUS

## 2023-11-11 MED ORDER — ACETAMINOPHEN 500 MG PO TABS: 1000.0000 mg | ORAL_TABLET | Freq: Four times a day (QID) | ORAL | 0 refills | Status: AC | PRN

## 2023-11-11 MED ORDER — CEFAZOLIN SODIUM 1 G IJ SOLR: 2.0000 g | Freq: Once | INTRAMUSCULAR | Status: DC

## 2023-11-11 MED ORDER — LIDOCAINE HCL (PF) 1 % IJ SOLN
INTRAMUSCULAR | Status: DC | PRN
Start: 2023-11-11 — End: 2023-11-11
  Administered 2023-11-11: 2 mL
  Administered 2023-11-11: 3 mL

## 2023-11-11 MED ORDER — ONDANSETRON HCL 4 MG/2ML IJ SOLN
INTRAMUSCULAR | Status: DC | PRN
  Administered 2023-11-11: 4 mg via INTRAVENOUS

## 2023-11-11 MED ORDER — ESMOLOL HCL 100 MG/10ML IV SOLN
INTRAVENOUS | Status: AC
  Filled 2023-11-11: qty 10

## 2023-11-11 MED ORDER — LIDOCAINE HCL (PF) 1 % IJ SOLN
INTRAMUSCULAR | Status: AC
  Filled 2023-11-11: qty 5

## 2023-11-11 MED ORDER — GLYCOPYRROLATE 0.2 MG/ML IJ SOLN
INTRAMUSCULAR | Status: DC | PRN
  Administered 2023-11-11: .2 mg via INTRAVENOUS

## 2023-11-11 MED ORDER — BUPIVACAINE-EPINEPHRINE (PF) 0.25% -1:200000 IJ SOLN
INTRAMUSCULAR | Status: DC | PRN
  Administered 2023-11-11: 10 mL

## 2023-11-11 MED ORDER — CHLORHEXIDINE GLUCONATE 0.12 % MT SOLN
15.0000 mL | Freq: Once | OROMUCOSAL | Status: AC
  Administered 2023-11-11: 15 mL via OROMUCOSAL

## 2023-11-11 SURGICAL SUPPLY — 59 items
BIT DRILL 2.2 CANN STRGHT (BIT) IMPLANT
BIT DRILL 2.5 CANN STRL (BIT) IMPLANT
BIT DRILL 2.6 CANN (BIT) IMPLANT
BIT DRILL 3.7 (BIT) IMPLANT
BNDG COMPR 6X5.8 VLCR NS LF (GAUZE/BANDAGES/DRESSINGS) ×2 IMPLANT
BNDG ELASTIC 4INX 5YD STR LF (GAUZE/BANDAGES/DRESSINGS) ×2 IMPLANT
BNDG ELASTIC 4X5.8 VLCR NS LF (GAUZE/BANDAGES/DRESSINGS) ×2 IMPLANT
BNDG ESMARCH 4X12 STRL LF (GAUZE/BANDAGES/DRESSINGS) ×2 IMPLANT
CHLORAPREP W/TINT 26 (MISCELLANEOUS) ×2 IMPLANT
CUFF TRNQT CYL 24X4X16.5-23 (TOURNIQUET CUFF) IMPLANT
CUFF TRNQT CYL 30X4X21-28X (TOURNIQUET CUFF) IMPLANT
DRAPE C-ARM 42X70 (DRAPES) ×2 IMPLANT
DRAPE C-ARMOR (DRAPES) ×2 IMPLANT
ELECTRODE REM PT RTRN 9FT ADLT (ELECTROSURGICAL) ×2 IMPLANT
GAUZE SPONGE 4X4 12PLY STRL (GAUZE/BANDAGES/DRESSINGS) IMPLANT
GAUZE XEROFORM 1X8 LF (GAUZE/BANDAGES/DRESSINGS) ×2 IMPLANT
GLOVE PI ORTHO PRO STRL 7.5 (GLOVE) ×2 IMPLANT
GOWN STRL REUS W/ TWL LRG LVL3 (GOWN DISPOSABLE) ×2 IMPLANT
GUIDEWIRE .045XTROC TIP LSR LN (WIRE) IMPLANT
GUIDEWIRE 1.35MM (WIRE) IMPLANT
IMPL TIGHTROP W/DRV K-LESS (Anchor) IMPLANT
KIT INST FIBULOCK NL STL DISP (Miscellaneous) IMPLANT
KIT TURNOVER KIT A (KITS) ×2 IMPLANT
KWIRE BB-TAK (WIRE) IMPLANT
LABEL OR SOLS (LABEL) ×2 IMPLANT
MANIFOLD NEPTUNE II (INSTRUMENTS) ×2 IMPLANT
NAIL FIBULOCK 3.0X130 LEFT (Nail) IMPLANT
NDL HYPO 22X1.5 SAFETY MO (MISCELLANEOUS) ×2 IMPLANT
NEEDLE HYPO 22X1.5 SAFETY MO (MISCELLANEOUS) ×2 IMPLANT
NS IRRIG 500ML POUR BTL (IV SOLUTION) ×2 IMPLANT
PACK EXTREMITY ARMC (MISCELLANEOUS) ×2 IMPLANT
PAD ABD DERMACEA PRESS 5X9 (GAUZE/BANDAGES/DRESSINGS) IMPLANT
PAD PREP OB/GYN DISP 24X41 (PERSONAL CARE ITEMS) ×2 IMPLANT
PADDING CAST BLEND 4X4 NS (MISCELLANEOUS) ×4 IMPLANT
PENCIL SMOKE EVACUATOR (MISCELLANEOUS) ×2 IMPLANT
PLATE LOCK THIRD TUBULAR 4H (Plate) IMPLANT
SCREW CANCELLOUS 3MM 3X14MM (Screw) IMPLANT
SCREW CANCELLOUS 3X16MM (Screw) IMPLANT
SCREW CANCELLOUS 3X24MM (Screw) IMPLANT
SCREW CANN FT 3.5X34 (Screw) IMPLANT
SCREW CANN FT 3.5X40 (Screw) IMPLANT
SCREW CANN FT 3.5X44 (Screw) IMPLANT
SCREW CORT 3.5X40 LP ANKLE (Screw) IMPLANT
SCREW KREULOCK CLAV 3.5X32 (Screw) IMPLANT
SCREW LP TITANIUM 3.5X40 (Screw) IMPLANT
SPLINT CAST 1 STEP 4X30 (MISCELLANEOUS) ×4 IMPLANT
SPONGE T-LAP 18X18 ~~LOC~~+RFID (SPONGE) ×2 IMPLANT
STAPLER SKIN PROX 35W (STAPLE) IMPLANT
STOCKINETTE M/LG 89821 (MISCELLANEOUS) ×2 IMPLANT
STOCKINETTE ORTHO 4X25 (MISCELLANEOUS) IMPLANT
STOCKINETTE ORTHO 6X25 (MISCELLANEOUS) IMPLANT
STRAP SAFETY 5IN WIDE (MISCELLANEOUS) ×2 IMPLANT
SUT BONE WAX W31G (SUTURE) IMPLANT
SUT MNCRL AB 3-0 PS2 27 (SUTURE) ×2 IMPLANT
SUTURE EHLN 3-0 FS-10 30 BLK (SUTURE) ×2 IMPLANT
SYR 10ML LL (SYRINGE) ×2 IMPLANT
SYSTEM IMPLANT FIBULOCK STRL (Miscellaneous) ×2 IMPLANT
TRAP FLUID SMOKE EVACUATOR (MISCELLANEOUS) ×2 IMPLANT
WATER STERILE IRR 500ML POUR (IV SOLUTION) ×2 IMPLANT

## 2023-11-11 NOTE — Anesthesia Preprocedure Evaluation (Addendum)
 Anesthesia Evaluation  Patient identified by MRN, date of birth, ID band Patient awake    Reviewed: Allergy & Precautions, NPO status , Patient's Chart, lab work & pertinent test results  History of Anesthesia Complications Negative for: history of anesthetic complications  Airway Mallampati: III  TM Distance: >3 FB Neck ROM: full    Dental  (+) Edentulous Upper, Edentulous Lower   Pulmonary shortness of breath, sleep apnea , Current Smoker and Patient abstained from smoking.   Pulmonary exam normal        Cardiovascular negative cardio ROS Normal cardiovascular exam     Neuro/Psych  Neuromuscular disease  negative psych ROS   GI/Hepatic Neg liver ROS,GERD  ,,  Endo/Other  Hypothyroidism    Renal/GU      Musculoskeletal   Abdominal   Peds  Hematology negative hematology ROS (+)   Anesthesia Other Findings Past Medical History: No date: Dyspnea No date: GERD (gastroesophageal reflux disease) No date: Hypothyroidism No date: Hypothyroidism No date: Liver hemangioma No date: Lymphedema No date: OSA (obstructive sleep apnea)     Comment:  a.) does not require nocturnal PAP therapy  Past Surgical History: 2014: CESAREAN SECTION 2005: CHOLECYSTECTOMY 03/09/2023: COLONOSCOPY WITH PROPOFOL ; N/A     Comment:  Procedure: COLONOSCOPY WITH PROPOFOL ;  Surgeon: Therisa Bi, MD;  Location: [facility] ENDOSCOPY;  Service:               Gastroenterology;  Laterality: N/A; 07/14/2023: INSERTION OF MESH     Comment:  Procedure: INSERTION OF MESH;  Surgeon: Jordis Laneta FALCON,               MD;  Location: [facility] ORS;  Service: General;; No date: MANDIBLE FRACTURE SURGERY No date: MULTIPLE TOOTH EXTRACTIONS 2014: TUBAL LIGATION 07/14/2023: XI ROBOTIC ASSISTED VENTRAL HERNIA; N/A     Comment:  Procedure: REPAIR, HERNIA, VENTRAL, ROBOT-ASSISTED               CONVERTED TO OPEN PROCEDURE;  Surgeon: Jordis Laneta FALCON,                MD;  Location: [facility] ORS;  Service: General;  Laterality:               N/A;     Reproductive/Obstetrics negative OB ROS                              Anesthesia Physical Anesthesia Plan  ASA: 3  Anesthesia Plan: General ETT   Post-op Pain Management: Toradol  IV (intra-op)*, Ofirmev  IV (intra-op)* and Regional block*   Induction: Intravenous  PONV Risk Score and Plan: 3 and Ondansetron , Dexamethasone , Midazolam  and Treatment may vary due to age or medical condition  Airway Management Planned: Oral ETT  Additional Equipment:   Intra-op Plan:   Post-operative Plan: Extubation in OR  Informed Consent: I have reviewed the patients History and Physical, chart, labs and discussed the procedure including the risks, benefits and alternatives for the proposed anesthesia with the patient or authorized representative who has indicated his/her understanding and acceptance.     Dental Advisory Given  Plan Discussed with: Anesthesiologist, CRNA and Surgeon  Anesthesia Plan Comments: (Patient consented for risks of anesthesia including but not limited to:  - adverse reactions to medications - damage to eyes, teeth, lips or other oral mucosa - nerve damage due to positioning  - sore  throat or hoarseness - Damage to heart, brain, nerves, lungs, other parts of body or loss of life  Patient voiced understanding and assent. Patient had positive UPT and + serum HCG. Discussed risks of anesthesia with pregnancy including loss of fetus. Patient wishes to proceed )         Anesthesia Quick Evaluation

## 2023-11-11 NOTE — Progress Notes (Signed)
 Dr. Chesley notified of glucose 68. Patient declines symptoms of hypoglycemia and is not diabetic. No new orders received at this time.

## 2023-11-11 NOTE — Anesthesia Postprocedure Evaluation (Signed)
 Anesthesia Post Note  Patient: EDWYNA DANGERFIELD  Procedure(s) Performed: OPEN REDUCTION INTERNAL FIXATION (ORIF) ANKLE FRACTURE (Left: Ankle) REPAIR, SYNDESMOSIS, ANKLE (Left)  Patient location during evaluation: PACU Anesthesia Type: General Level of consciousness: awake and alert Pain management: pain level controlled Vital Signs Assessment: post-procedure vital signs reviewed and stable Respiratory status: spontaneous breathing, nonlabored ventilation, respiratory function stable and patient connected to nasal cannula oxygen Cardiovascular status: blood pressure returned to baseline and stable Postop Assessment: no apparent nausea or vomiting Anesthetic complications: no   No notable events documented.   Last Vitals:  Vitals:   11/11/23 1700 11/11/23 1715  BP: 125/83 120/60  Pulse: 94 92  Resp: 13 20  Temp:    SpO2: 100% 97%    Last Pain:  Vitals:   11/11/23 1700  TempSrc:   PainSc: Asleep                 Lendia LITTIE Mae

## 2023-11-11 NOTE — Transfer of Care (Signed)
 Immediate Anesthesia Transfer of Care Note  Patient: Karen Hill  Procedure(s) Performed: OPEN REDUCTION INTERNAL FIXATION (ORIF) ANKLE FRACTURE (Left: Ankle) REPAIR, SYNDESMOSIS, ANKLE (Left)  Patient Location: PACU  Anesthesia Type:GA combined with regional for post-op pain  Level of Consciousness: drowsy  Airway & Oxygen Therapy: Patient Spontanous Breathing and Patient connected to face mask oxygen  Post-op Assessment: Report given to RN and Post -op Vital signs reviewed and stable  Post vital signs: Reviewed and stable  Last Vitals:  Vitals Value Taken Time  BP 112/55 11/11/23 16:39  Temp    Pulse 107 11/11/23 16:41  Resp 18 11/11/23 16:41  SpO2 100 % 11/11/23 16:41  Vitals shown include unfiled device data.  Last Pain:  Vitals:   11/11/23 1140  TempSrc: Temporal  PainSc: 7          Complications: No notable events documented.

## 2023-11-11 NOTE — Anesthesia Procedure Notes (Signed)
 Anesthesia Regional Block: Adductor canal block   Pre-Anesthetic Checklist: , timeout performed,  Correct Patient, Correct Site, Correct Laterality,  Correct Procedure, Correct Position, site marked,  Risks and benefits discussed,  Surgical consent,  Pre-op evaluation,  At surgeon's request and post-op pain management  Laterality: Lower and Left  Prep: chloraprep       Needles:  Injection technique: Single-shot  Needle Type: Echogenic Needle     Needle Length: 9cm  Needle Gauge: 21     Additional Needles:   Procedures:,,,, ultrasound used (permanent image in chart),,    Narrative:  Start time: 11/11/2023 1:52 PM End time: 11/11/2023 1:55 PM Injection made incrementally with aspirations every 5 mL.  Performed by: Personally  Anesthesiologist: Chesley Lendia CROME, MD  Additional Notes: Patient's chart reviewed and they were deemed appropriate candidate for procedure, per surgeon's request. Patient educated about risks, benefits, and alternatives of the block including but not limited to: temporary or permanent nerve damage, bleeding, infection, damage to surround tissues, block failure, local anesthetic toxicity. Patient expressed understanding. A formal time-out was conducted consistent with institution rules.  Monitors were applied, and minimal sedation used (see nursing record). The site was prepped with skin prep and allowed to dry, and sterile gloves were used. A high frequency linear ultrasound probe with probe cover was utilized throughout. Femoral artery visualized at mid-thigh level, local anesthetic injected anterolateral to it, and echogenic block needle trajectory was monitored throughout. Hydrodissection of saphenous nerve visualized and appeared anatomically normal. Aspiration performed every 5ml. Blood vessels were avoided. All injections were performed without resistance and free of blood and paresthesias. The patient tolerated the procedure well.  Injectate: 10cc 0.5%  bupivacaine  + 10cc Exparel 

## 2023-11-11 NOTE — Brief Op Note (Signed)
 11/11/2023  4:35 PM  PATIENT:  Karen Hill  48 y.o. female  PRE-OPERATIVE DIAGNOSIS:  FRACTURED OF THE ANKLE, LEFT CLOSED INITIAL ENCOUNTER  POST-OPERATIVE DIAGNOSIS:  FRACTURED OF THE ANKLE, LEFT CLOSED INITIAL ENCOUNTER  PROCEDURE:  Procedure(s) with comments: OPEN REDUCTION INTERNAL FIXATION (ORIF) ANKLE FRACTURE (Left) - BLOCK,OPEN TREATMENT OF MEDIAL MALLEOLUS FRACTURE INCLUDING INTERNAL FIXATION WHEN PERFORMED REPAIR, SYNDESMOSIS, ANKLE (Left) - OPEN TREATMENT OF DISTAL TIBIAL FIBULAR JOINT DISRUPTION, INCLUDING INTERNAL FIXATION WHEN PERFORMED  SURGEON:  Surgeons and Role:    * Greycen Felter, Juliene SAUNDERS, DPM - Primary  PHYSICIAN ASSISTANT:   ASSISTANTS: none   ANESTHESIA:   regional  EBL:  30cc   BLOOD ADMINISTERED:none  DRAINS: none   LOCAL MEDICATIONS USED:  BUPIVICAINE  and Amount: 10 ml with epinephrine   SPECIMEN:  No Specimen  DISPOSITION OF SPECIMEN:  N/A  COUNTS:  YES  TOURNIQUET:  * No tourniquets in log *  DICTATION: .Note written in EPIC  PLAN OF CARE: Discharge to home after PACU  PATIENT DISPOSITION:  PACU - hemodynamically stable.   Delay start of Pharmacological VTE agent (>24hrs) due to surgical blood loss or risk of bleeding: yes

## 2023-11-11 NOTE — H&P (Addendum)
 History and Physical Interval Note:  11/11/2023 11:50 AM  Kinslei D Brugger  has presented today for surgery, with the diagnosis of left ankle bimalleolar fracture.  The various methods of treatment have been discussed with the patient and family. After consideration of risks, benefits and other options for treatment, the patient has consented to   ORIF of the left ankle as a surgical intervention.  The patient's history has been reviewed, patient examined, no change in status, stable for surgery.  I have reviewed the patient's chart and labs.  Questions were answered to the patient's satisfaction.     Jaliel Deavers R Malakhai Beitler  Addendum note: patient's urine and serum HCG elevated. Discussed risks and benefits of surgery once again. Given bimalleolar nature of fracture I recommended proceeding with ORIF. Will switch to Ancef  abx pppx and plan for Lovenox  DVT ppx post op. She denies recent intercourse, states has not had penetration in 11 years and hx of tubal ligation. Does not currently follow with Gyn, referral to [facility] placed for follow up  Juliene Medicine, DPM 11/11/2023

## 2023-11-11 NOTE — Anesthesia Procedure Notes (Signed)
 Procedure Name: General with mask airway Date/Time: 11/11/2023 2:08 PM  Performed by: Ledora Duncan, CRNAPre-anesthesia Checklist: Patient identified, Emergency Drugs available, Suction available and Patient being monitored Patient Re-evaluated:Patient Re-evaluated prior to induction Oxygen Delivery Method: Simple face mask Induction Type: IV induction Placement Confirmation: positive ETCO2 and breath sounds checked- equal and bilateral Dental Injury: Teeth and Oropharynx as per pre-operative assessment

## 2023-11-11 NOTE — Anesthesia Procedure Notes (Signed)
 Anesthesia Regional Block: Popliteal block   Pre-Anesthetic Checklist: , timeout performed,  Correct Patient, Correct Site, Correct Laterality,  Correct Procedure, Correct Position, site marked,  Risks and benefits discussed,  Surgical consent,  Pre-op evaluation,  At surgeon's request and post-op pain management  Laterality: Lower and Left  Prep: chloraprep       Needles:  Injection technique: Single-shot  Needle Type: Echogenic Needle     Needle Length: 9cm  Needle Gauge: 21     Additional Needles:   Procedures:,,,, ultrasound used (permanent image in chart),,    Narrative:  Start time: 11/11/2023 1:47 PM End time: 11/11/2023 1:49 PM Injection made incrementally with aspirations every 5 mL.  Performed by: Personally  Anesthesiologist: Chesley Lendia CROME, MD  Additional Notes: Patient's chart reviewed and they were deemed appropriate candidate for procedure, at surgeon's request. Patient educated about risks, benefits, and alternatives of the block including but not limited to: temporary or permanent nerve damage, bleeding, infection, damage to surround tissues, block failure, local anesthetic toxicity. Patient expressed understanding. A formal time-out was conducted consistent with institution rules.  Monitors were applied, and minimal sedation used. The site was prepped with skin prep and allowed to dry, and sterile gloves were used. A high frequency linear ultrasound probe with probe cover was utilized throughout. Popliteal artery pulsatile and visualized in popliteal fossa along with adjacent sciatic nerve and its branch point, which appeared anatomically normal, local anesthetic injected around them just proximal to the branch point, and echogenic block needle trajectory was monitored throughout. Aspiration performed every 5ml. Blood vessels were avoided. All injections were performed without resistance and free of blood and paresthesias. The patient tolerated the procedure  well.  Injectate: 10cc 0.5% bupivicaine  + 10 Exparel 

## 2023-11-12 NOTE — Op Note (Signed)
 Patient Name: TENZIN PAVON DOB: 01-29-1975  MRN: [id number]   Date of Service: 11/11/2023  Surgeon: Dr. Juliene Medicine, DPM Assistants: None Pre-operative Diagnosis:  Left ankle bimalleolar fracture Post-operative Diagnosis:  Left ankle bimalleolar fracture Anterior syndesmosis disruption Procedures: ORIF bimalleolar ankle fracture left Repair syndesmosis left ankle  Pathology/Specimens: * No specimens in log * Anesthesia: Regional block Hemostasis: * No tourniquets in log * Estimated Blood Loss: 50 cc Materials:  Implant Name Type Inv. Item Serial No. Manufacturer Lot No. LRB No. Used Action  SYSTEM IMPLANT FIBULOCK STRL - [id number] Miscellaneous SYSTEM IMPLANT FIBULOCK STRL  [employer] [id number] Left 1 Temporary Fixation (Not Implanted)  NAIL FIBULOCK 3.0X130 LEFT - [id number] Nail NAIL FIBULOCK 3.0X130 LEFT  [employer] [id number] Left 1 Implanted  SCREW CANCELLOUS 3X16MM - [id number] Screw SCREW CANCELLOUS 3X16MM  [employer]  Left 1 Implanted  SCREW CANCELLOUS 3X14MM - [id number] Screw SCREW CANCELLOUS 3X14MM  [employer]  Left 1 Implanted  SCREW CANCELLOUS 3X24MM - [id number] Screw SCREW CANCELLOUS 3X24MM  [employer]  Left 1 Implanted  SCREW CANN FT 3.5X40 - [id number] Screw SCREW CANN FT 3.5X40  [employer]  Left 1 Implanted and Explanted  SCREW CANN FT 3.5X44 - [id number] Screw SCREW CANN FT 3.5X44  [employer]  Left 1 Implanted  SCREW CANN FT 3.5X34 - [id number] Screw SCREW CANN FT 3.5X34  [employer]  Left 1 Implanted  PLATE LOCK THIRD TUBULAR 4H - [id number] Plate PLATE LOCK THIRD TUBULAR 4H  [employer]  Left 1 Implanted  SCREW LP TITANIUM 3.5X40 - [id number] Screw SCREW LP TITANIUM 3.5X40  [employer]  Left 1 Implanted  IMPL TIGHTROP W/DRV K-LESS - [id number] Anchor IMPL TIGHTROP W/DRV K-LESS  [employer] [id number] Left 1 Implanted and Explanted  SCREW KREULOCK CLAV 3.5X32 - [id number] Screw SCREW KREULOCK CLAV 3.5X32  [employer]  Left 1 Implanted  SCREW CORT  3.5X40 LP ANKLE - [id number] Screw SCREW CORT 3.5X40 LP ANKLE  [employer]  Left 1 Implanted   Medications: 10 cc 0.25% bupivacaine  with epinephrine  Complications: No complication noted  Indications for Procedure:  This is a 48 y.o. female with a history of traumatic injury left lower extremity resulting in bimalleolar ankle fracture.  Surgical repair was recommended.  The risk benefits and potential complications of the procedure were discussed prior to surgery.  All questions addressed.  Informed consent signed reviewed   Procedure in Detail: Patient was identified in pre-operative holding area. Formal consent was signed and the left lower extremity was marked. Patient was brought back to the operating room. Anesthesia was induced. The extremity was prepped and draped in the usual sterile fashion. Timeout was taken to confirm patient name, laterality, and procedure prior to incision.   Attention was then directed to the left ankle where I made a linear incision overlying the medial malleolar fracture.  The saphenous neurovascular bundle was identified and retracted anteriorly.  Dissection was carried to the level of the bone and a periosteal incision was made and the fracture plane identified.  Distal traction on the ankle was placed and the fracture was reduced with the aid of a bone clamp.  Guidewires for percutaneous screws were then placed.  With the reduction maintaining on the medial malleolus I then directed my attention to the fibula.  The guidewire for the fibular nail was placed under fluoroscopic guidance.  The nail was reamed drilled and inserted and placed into position.  The talons were deployed proximally.  Her bone  density was very poor.  3.0 mm cancellous screws were used to fixate the distal portion of the fracture to the nail, I had to use the anterior posterior screw as well for adequate fixation.  I then returned medially and placed the 3.5 mm fully threaded compression screws.   External rotation stress exam was performed and there was gapping of the anterior portion of the syndesmosis.  I then returned to the fibular nail and placed the syndesmosis through the proximal hole.  After deploying the tight rope and gaining tension to an adequate level, the medial button pulled through the bone, and had to be retrieved.  This did contribute to some loss of correction at the medial shoulder of the tibia but there was not significant step-off to warrant removal of the screws at this point.  I then placed a 3.5 millimeter screw through the distal hole for tricortical syndesmotic fixation.  I returned medially and placed a 4-hole antiglide plate and fixated with locking and nonlocking screws.  Final films were taken.  Was closed in layers with 2-0 Vicryl 3-0 Monocryl and skin staples.  The foot was then dressed with Xeroform dry sterile dressings a 2 layer compression dressing, and posterior and AO sugar-tong fiberglass splints. Patient tolerated the procedure well.   Disposition: Following a period of post-operative monitoring, patient will be transferred to home.  She will remain nonweightbearing.

## 2023-11-14 ENCOUNTER — Encounter: Payer: Self-pay | Admitting: Podiatry

## 2023-11-14 ENCOUNTER — Encounter: Payer: Self-pay | Admitting: Lab

## 2023-11-16 ENCOUNTER — Telehealth: Payer: Self-pay

## 2023-11-16 ENCOUNTER — Ambulatory Visit (INDEPENDENT_AMBULATORY_CARE_PROVIDER_SITE_OTHER): Admitting: Podiatry

## 2023-11-16 ENCOUNTER — Other Ambulatory Visit

## 2023-11-16 ENCOUNTER — Ambulatory Visit (INDEPENDENT_AMBULATORY_CARE_PROVIDER_SITE_OTHER)

## 2023-11-16 DIAGNOSIS — I10 Essential (primary) hypertension: Secondary | ICD-10-CM

## 2023-11-16 DIAGNOSIS — S82892A Other fracture of left lower leg, initial encounter for closed fracture: Secondary | ICD-10-CM

## 2023-11-16 DIAGNOSIS — E039 Hypothyroidism, unspecified: Secondary | ICD-10-CM

## 2023-11-16 MED ORDER — OXYCODONE HCL 5 MG PO TABS: 5.0000 mg | ORAL_TABLET | ORAL | 0 refills | Status: AC | PRN

## 2023-11-16 MED ORDER — VITAMIN D (ERGOCALCIFEROL) 1.25 MG (50000 UNIT) PO CAPS: 50000.0000 [IU] | ORAL_CAPSULE | ORAL | 0 refills | Status: AC

## 2023-11-16 NOTE — Addendum Note (Signed)
 Addended byBETHA MEDICINE, Tru Rana R on: 11/16/2023 04:18 PM   Modules accepted: Orders

## 2023-11-16 NOTE — Telephone Encounter (Signed)
 PA request received from CoverMyMeds for oxycodone  HCl 5 mg tablet. PA submitted through CoverMyMeds and waiting on response.  Karen Hill  (KeyBETHA MANDES) Rx #: [id number]

## 2023-11-16 NOTE — Progress Notes (Addendum)
  Subjective:  Patient ID: Karen Hill, female    DOB: 1975-12-09,  MRN: [id number]  Chief Complaint  Patient presents with   Post-op Follow-up    RM 9 POV #1 DOS 11/11/2023 LT OPEN TREATMENT OF MEDIAL MALLEOLUS FX, INC FIXATION/ LT OPEN TREATMENT OF DISTAL TIBIOFIBULAR JOINT DISRUPTION INC INTERNAL FIXATION. All staples are intact, with moderate swelling of the left leg.    48 y.o. female returns for post-op check.  Other than pain doing fairly well.  Review of Systems: Negative except as noted in the HPI. Denies N/V/F/Ch.   Objective:  There were no vitals filed for this visit. There is no height or weight on file to calculate BMI. Constitutional Well developed. Well nourished.  Vascular Foot warm and well perfused. Capillary refill normal to all digits.  Calf is soft and supple, no posterior calf or knee pain, negative Homans' sign  Neurologic Normal speech. Oriented to person, place, and time. Epicritic sensation to light touch grossly present bilaterally.  Dermatologic Skin healing well without signs of infection. Skin edges well coapted without signs of infection.  Splint shows signs of weightbearing  Orthopedic: Tenderness to palpation noted about the surgical site.   Multiple view plain film radiographs: No change in immediate postoperative films, all hardware intact and in position Assessment:   1. Fracture, ankle, left, closed, initial encounter    Plan:  Patient was evaluated and treated and all questions answered.  S/p foot surgery left -Progressing as expected post-operatively. -XR: Noted above -WB Status: Nonweightbearing in below-knee fiberglass cast which was applied today -Sutures: Return in 2 weeks to have staples removed. -Medications: Refill oxycodone  sent to pharmacy -Foot redressed.  Continue ice and elevation behind knee - Referral has been placed for OB/GYN follow-up on her positive hCG - Due to poor bone density intraoperatively I placed her on  prescription 50,000 unit/week vitamin D  supplementation  No follow-ups on file.

## 2023-11-17 LAB — CMP14+EGFR
ALT: 11 IU/L (ref 0–32)
AST: 21 IU/L (ref 0–40)
Albumin: 3.3 g/dL — ABNORMAL LOW (ref 3.9–4.9)
Alkaline Phosphatase: 111 IU/L (ref 41–116)
BUN/Creatinine Ratio: 12 (ref 9–23)
BUN: 8 mg/dL (ref 6–24)
Bilirubin Total: 0.8 mg/dL (ref 0.0–1.2)
CO2: 27 mmol/L (ref 20–29)
Calcium: 9.5 mg/dL (ref 8.7–10.2)
Chloride: 97 mmol/L (ref 96–106)
Creatinine, Ser: 0.66 mg/dL (ref 0.57–1.00)
Globulin, Total: 2.6 g/dL (ref 1.5–4.5)
Glucose: 74 mg/dL (ref 70–99)
Potassium: 3.7 mmol/L (ref 3.5–5.2)
Sodium: 142 mmol/L (ref 134–144)
Total Protein: 5.9 g/dL — ABNORMAL LOW (ref 6.0–8.5)
eGFR: 109 mL/min/1.73 (ref >59)

## 2023-11-17 LAB — TSH: TSH: 0.096 u[IU]/mL — ABNORMAL LOW (ref 0.450–4.500)

## 2023-11-22 ENCOUNTER — Ambulatory Visit: Admitting: "Endocrinology

## 2023-11-22 ENCOUNTER — Encounter: Payer: Self-pay | Admitting: Student in an Organized Health Care Education/Training Program

## 2023-11-23 ENCOUNTER — Ambulatory Visit (INDEPENDENT_AMBULATORY_CARE_PROVIDER_SITE_OTHER): Admitting: Surgery

## 2023-11-23 ENCOUNTER — Encounter: Payer: Self-pay | Admitting: Surgery

## 2023-11-23 VITALS — BP 112/78 | HR 108 | Ht 66.0 in | Wt 205.0 lb

## 2023-11-23 DIAGNOSIS — Z09 Encounter for follow-up examination after completed treatment for conditions other than malignant neoplasm: Secondary | ICD-10-CM | POA: Diagnosis not present

## 2023-11-23 DIAGNOSIS — K430 Incisional hernia with obstruction, without gangrene: Secondary | ICD-10-CM

## 2023-11-23 NOTE — Patient Instructions (Signed)
Please call the office if you have any questions or concerns. 

## 2023-11-23 NOTE — Progress Notes (Signed)
 Outpatient Surgical Follow Up  11/23/2023  Karen Hill is an 48 y.o. female.   Chief Complaint  Patient presents with   Follow-up    HPI: Karen Hill 3 1/2 monthsout from open AWR, she is doing very well form abdominal perspective, all wounds are healed. No fevers, no abd pain, tolerating PO. Main issue is Left broken ankle in the setting of chronic lymphedema and back pain. Still smoking   Past Medical History:  Diagnosis Date   Dyspnea    GERD (gastroesophageal reflux disease)    Hypothyroidism    Hypothyroidism    Liver hemangioma    Lymphedema    OSA (obstructive sleep apnea)    a.) does not require nocturnal PAP therapy    Past Surgical History:  Procedure Laterality Date   CESAREAN SECTION  2014   CHOLECYSTECTOMY  2005   COLONOSCOPY WITH PROPOFOL  N/A 03/09/2023   Procedure: COLONOSCOPY WITH PROPOFOL ;  Surgeon: Therisa Bi, MD;  Location: [facility] ENDOSCOPY;  Service: Gastroenterology;  Laterality: N/A;   INSERTION OF MESH  07/14/2023   Procedure: INSERTION OF MESH;  Surgeon: Jordis Karen FALCON, MD;  Location: [facility] ORS;  Service: General;;   MANDIBLE FRACTURE SURGERY     MULTIPLE TOOTH EXTRACTIONS     ORIF ANKLE FRACTURE Left 11/11/2023   Procedure: OPEN REDUCTION INTERNAL FIXATION (ORIF) ANKLE FRACTURE;  Surgeon: Silva Juliene SAUNDERS, DPM;  Location: [facility] ORS;  Service: Orthopedics/Podiatry;  Laterality: Left;  BLOCK,OPEN TREATMENT OF MEDIAL MALLEOLUS FRACTURE INCLUDING INTERNAL FIXATION WHEN PERFORMED   SYNDESMOSIS REPAIR Left 11/11/2023   Procedure: REPAIR, SYNDESMOSIS, ANKLE;  Surgeon: Silva Juliene SAUNDERS, DPM;  Location: [facility] ORS;  Service: Orthopedics/Podiatry;  Laterality: Left;  OPEN TREATMENT OF DISTAL TIBIAL FIBULAR JOINT DISRUPTION, INCLUDING INTERNAL FIXATION WHEN PERFORMED   TUBAL LIGATION  2014   XI ROBOTIC ASSISTED VENTRAL HERNIA N/A 07/14/2023   Procedure: REPAIR, HERNIA, VENTRAL, ROBOT-ASSISTED CONVERTED TO OPEN PROCEDURE;  Surgeon: Jordis Karen FALCON, MD;  Location: [facility] ORS;   Service: General;  Laterality: N/A;    Family History  Problem Relation Age of Onset   Diabetes Mother    Hypertension Father    Breast cancer Neg Hx     Social History:  reports that she has been smoking cigarettes. She has a 10 pack-year smoking history. She has been exposed to tobacco smoke. She has never used smokeless tobacco. She reports current alcohol use. She reports that she does not use drugs.  Allergies:  Allergies  Allergen Reactions   Penicillin G Hives and Itching    Tolerated first generation cephalosporin (CEFAZOLIN ) in the past with no documented ADRs   Amoxicillin Hives    Tolerated first generation cephalosporin (CEFAZOLIN ) in the past with no documented ADRs.   Shellfish Allergy Nausea And Vomiting    Medications reviewed.    ROS Full ROS performed and is otherwise negative other than what is stated in HPI   BP 112/78   Pulse (!) 108   Ht 5' 6 (1.676 m)   Wt 205 lb (93 kg)   SpO2 97%   BMI 33.09 kg/m   Physical Exam Vitals and nursing note reviewed. Exam conducted with a chaperone present.  Constitutional:      Appearance: She is obese. She is not ill-appearing.  Eyes:     General: No scleral icterus.       Right eye: No discharge.        Left eye: No discharge.  Abdominal:     General: Abdomen is flat. There  is no distension.     Palpations: Abdomen is soft. There is no mass.     Tenderness: There is no abdominal tenderness. There is no guarding or rebound.     Hernia: No hernia is present.     Comments: Midline laparotomy scar, no wounds no infection, no rebound   Skin:    General: Skin is warm and dry.     Capillary Refill: Capillary refill takes less than 2 seconds.  Neurological:     General: No focal deficit present.     Mental Status: She is alert and oriented to person, place, and time.  Psychiatric:        Mood and Affect: Mood normal.        Behavior: Behavior normal.        Thought Content: Thought content normal.         Judgment: Judgment normal.    Assessment/Plan: Doing well from AWR and abdominal perspective w/o evidence of complications. F/U prn I personally spent a total of 20 minutes in the care of the patient today including performing a medically appropriate exam/evaluation, counseling and educating, placing orders, referring and communicating with other health care professionals, documenting clinical information in the EHR, independently interpreting and reviewing images studies and coordinating care.   Karen Luna, MD [facility] General Surgeon

## 2023-11-24 ENCOUNTER — Other Ambulatory Visit: Payer: Self-pay

## 2023-11-24 ENCOUNTER — Emergency Department

## 2023-11-24 ENCOUNTER — Encounter: Payer: Self-pay | Admitting: *Deleted

## 2023-11-24 ENCOUNTER — Emergency Department
Admission: EM | Admit: 2023-11-24 | Discharge: 2023-11-24 | Disposition: A | Discharge status: Home / Self Care | Attending: Emergency Medicine | Admitting: Emergency Medicine

## 2023-11-24 DIAGNOSIS — E876 Hypokalemia: Secondary | ICD-10-CM | POA: Insufficient documentation

## 2023-11-24 DIAGNOSIS — R42 Dizziness and giddiness: Secondary | ICD-10-CM | POA: Insufficient documentation

## 2023-11-24 LAB — URINALYSIS, ROUTINE W REFLEX MICROSCOPIC
Bilirubin Urine: NEGATIVE
Glucose, UA: NEGATIVE mg/dL
Hgb urine dipstick: NEGATIVE
Ketones, ur: NEGATIVE mg/dL
Leukocytes,Ua: NEGATIVE
Nitrite: NEGATIVE
Protein, ur: NEGATIVE mg/dL
Specific Gravity, Urine: 1.008 (ref 1.005–1.030)
pH: 7 (ref 5.0–8.0)

## 2023-11-24 LAB — BASIC METABOLIC PANEL WITH GFR
Anion gap: 10 (ref 5–15)
BUN: 7 mg/dL (ref 6–20)
CO2: 33 mmol/L — ABNORMAL HIGH (ref 22–32)
Calcium: 9.6 mg/dL (ref 8.9–10.3)
Chloride: 97 mmol/L — ABNORMAL LOW (ref 98–111)
Creatinine, Ser: 0.62 mg/dL (ref 0.44–1.00)
GFR, Estimated: >60 mL/min (ref >60)
Glucose, Bld: 90 mg/dL (ref 70–99)
Potassium: 3.4 mmol/L — ABNORMAL LOW (ref 3.5–5.1)
Sodium: 140 mmol/L (ref 135–145)

## 2023-11-24 LAB — CBC
HCT: 48.1 % — ABNORMAL HIGH (ref 36.0–46.0)
Hemoglobin: 15.5 g/dL — ABNORMAL HIGH (ref 12.0–15.0)
MCH: 31.8 pg (ref 26.0–34.0)
MCHC: 32.2 g/dL (ref 30.0–36.0)
MCV: 98.8 fL (ref 80.0–100.0)
Platelets: 271 K/uL (ref 150–400)
RBC: 4.87 MIL/uL (ref 3.87–5.11)
RDW: 13.4 % (ref 11.5–15.5)
WBC: 5.7 K/uL (ref 4.0–10.5)
nRBC: 0 % (ref 0.0–0.2)

## 2023-11-24 LAB — PROTIME-INR
INR: 0.9 (ref 0.8–1.2)
Prothrombin Time: 12.9 s (ref 11.4–15.2)

## 2023-11-24 LAB — TROPONIN T, HIGH SENSITIVITY
Troponin T High Sensitivity: 21 ng/L — ABNORMAL HIGH (ref 0–19)
Troponin T High Sensitivity: 21 ng/L — ABNORMAL HIGH (ref 0–19)

## 2023-11-24 LAB — PREGNANCY, URINE: Preg Test, Ur: NEGATIVE

## 2023-11-24 MED ORDER — IOHEXOL 350 MG/ML SOLN
100.0000 mL | Freq: Once | INTRAVENOUS | Status: AC | PRN
  Administered 2023-11-24: 100 mL via INTRAVENOUS

## 2023-11-24 MED ORDER — KETOROLAC TROMETHAMINE 30 MG/ML IJ SOLN
15.0000 mg | Freq: Once | INTRAMUSCULAR | Status: AC
  Administered 2023-11-24: 15 mg via INTRAVENOUS
  Filled 2023-11-24: qty 1

## 2023-11-24 MED ORDER — LACTATED RINGERS IV BOLUS
1000.0000 mL | Freq: Once | INTRAVENOUS | Status: AC
  Administered 2023-11-24: 1000 mL via INTRAVENOUS

## 2023-11-24 MED ORDER — MECLIZINE HCL 25 MG PO TABS
25.0000 mg | ORAL_TABLET | Freq: Once | ORAL | Status: AC
  Administered 2023-11-24: 25 mg via ORAL
  Filled 2023-11-24: qty 1

## 2023-11-24 MED ORDER — MECLIZINE HCL 25 MG PO TABS: 25.0000 mg | ORAL_TABLET | Freq: Three times a day (TID) | ORAL | 0 refills | Status: AC | PRN

## 2023-11-24 NOTE — ED Triage Notes (Addendum)
 Pt brought in via ems from home.  Pt reports feeling light headed.  Sx for several months. Sx worse for past 2-3 days.   Pt reports heaviness behind ears.  No headache.  No chest pain or sob.  No n/v.  Pt alert  speech clear.   Pt has a cast on left lower leg.

## 2023-11-24 NOTE — ED Provider Notes (Signed)
 ----------------------------------------- 7:51 PM on 11/24/2023 -----------------------------------------  Blood pressure 114/79, pulse (!) 120, temperature 98 F (36.7 C), temperature source Oral, resp. rate 20, height 5' 9 (1.753 m), weight 93 kg, last menstrual period 04/26/2023, SpO2 100%.  Assuming care from Dr. Claudene.  In short, Karen Hill is a 48 y.o. female with a chief complaint of Dizziness .  Refer to the original H&P for additional details.  The current plan of care is to wait for results of CTA chest to rule out PE and reassess after fluids and medication.  ____________________________________________    ED Results / Procedures / Treatments   Labs (all labs ordered are listed, but only abnormal results are displayed) Labs Reviewed  BASIC METABOLIC PANEL WITH GFR - Abnormal; Notable for the following components:      Result Value   Potassium 3.4 (*)    Chloride 97 (*)    CO2 33 (*)    All other components within normal limits  CBC - Abnormal; Notable for the following components:   Hemoglobin 15.5 (*)    HCT 48.1 (*)    All other components within normal limits  URINALYSIS, ROUTINE W REFLEX MICROSCOPIC - Abnormal; Notable for the following components:   Color, Urine YELLOW (*)    APPearance HAZY (*)    All other components within normal limits  TROPONIN T, HIGH SENSITIVITY - Abnormal; Notable for the following components:   Troponin T High Sensitivity 21 (*)    All other components within normal limits  TROPONIN T, HIGH SENSITIVITY - Abnormal; Notable for the following components:   Troponin T High Sensitivity 21 (*)    All other components within normal limits  PROTIME-INR  PREGNANCY, URINE    RADIOLOGY  I personally viewed and evaluated these images as part of my medical decision making, as well as reviewing the written report by the radiologist.  ED Provider Interpretation: CTA is negative for acute abnormalities including PE.  CT Angio Chest PE  W and/or Wo Contrast Result Date: 11/24/2023 CLINICAL DATA:  Dizziness and syncope. Tachycardia. Concern for pulmonary embolism. EXAM: CT ANGIOGRAPHY CHEST WITH CONTRAST TECHNIQUE: Multidetector CT imaging of the chest was performed using the standard protocol during bolus administration of intravenous contrast. Multiplanar CT image reconstructions and MIPs were obtained to evaluate the vascular anatomy. RADIATION DOSE REDUCTION: This exam was performed according to the departmental dose-optimization program which includes automated exposure control, adjustment of the mA and/or kV according to patient size and/or use of iterative reconstruction technique. CONTRAST:  OMNIPAQUE  IOHEXOL  350 MG/ML SOLN COMPARISON:  Chest radiograph dated 11/24/2023. FINDINGS: Cardiovascular: There is no cardiomegaly or pericardial effusion. The thoracic aorta is unremarkable. The origins of the great vessels of the aortic arch are patent. No pulmonary artery embolus identified. Mediastinum/Nodes: No hilar or mediastinal adenopathy. The esophagus is grossly unremarkable. No mediastinal fluid collection. Lungs/Pleura: No focal consolidation, pleural effusion, pneumothorax. The central airways are patent. Upper Abdomen: Cholecystectomy. Musculoskeletal: No acute osseous pathology. Review of the MIP images confirms the above findings. IMPRESSION: No acute intrathoracic pathology. No CT evidence of pulmonary artery embolus. Electronically Signed   By: Vanetta Chou M.D.   On: 11/24/2023 20:32   CT Head Wo Contrast Result Date: 11/24/2023 EXAM: CT HEAD WITHOUT CONTRAST 11/24/2023 04:55:34 PM TECHNIQUE: CT of the head was performed without the administration of intravenous contrast. Automated exposure control, iterative reconstruction, and/or weight based adjustment of the mA/kV was utilized to reduce the radiation dose to as low as reasonably  achievable. COMPARISON: Prior study dated 05/20/2007. CLINICAL HISTORY: Headache,  increasing frequency or severity. FINDINGS: BRAIN AND VENTRICLES: No acute hemorrhage. No evidence of acute infarct. No hydrocephalus. No extra-axial collection. No mass effect or midline shift. ORBITS: No acute abnormality. SINUSES: No acute abnormality. SOFT TISSUES AND SKULL: No acute soft tissue abnormality. No skull fracture. IMPRESSION: 1. No acute intracranial abnormality. Electronically signed by: Norman Gatlin MD 11/24/2023 05:05 PM EST RP Workstation: [id number]   DG Chest 2 View Result Date: 11/24/2023 CLINICAL DATA:  Dizziness lightheaded EXAM: DG CHEST 2V COMPARISON:  None Available. FINDINGS: The heart size and mediastinal contours are within normal limits. Both lungs are clear. The visualized skeletal structures are unremarkable. IMPRESSION: No active cardiopulmonary disease. Electronically Signed   By: Luke Bun M.D.   On: 11/24/2023 16:37     PROCEDURES:  Critical Care performed: No  Procedures   MEDICATIONS ORDERED IN ED: Medications  lactated ringers  bolus 1,000 mL (0 mLs Intravenous Stopped 11/24/23 2103)  ketorolac  (TORADOL ) 30 MG/ML injection 15 mg (15 mg Intravenous Given 11/24/23 2008)  meclizine (ANTIVERT) tablet 25 mg (25 mg Oral Given 11/24/23 2008)  iohexol  (OMNIPAQUE ) 350 MG/ML injection 100 mL (100 mLs Intravenous Contrast Given 11/24/23 2021)     IMPRESSION / MDM / ASSESSMENT AND PLAN / ED COURSE  I reviewed the triage vital signs and the nursing notes.                             48 year old female presents for evaluation of dizziness.  Patient's presentation is most consistent with acute complicated illness / injury requiring diagnostic workup.  Patient's diagnosis is consistent with dizziness. Patient will be discharged home with prescriptions for meclizine. Patient is to follow up with PCP as needed or otherwise directed. Patient is given ED precautions to return to the ED for any worsening or new symptoms.  Clinical Course as of 11/24/23 2144   Thu Nov 24, 2023  2042 Troponin T, High Sensitivity(!) Elevated but no change from before. [LD]  2057 CT Angio Chest PE W and/or Wo Contrast Negative for acute abnormality, no PE. [LD]  2144 Patient stated that she was feeling better after receiving medications and fluids. [LD]    Clinical Course User Index [LD] Cleaster Tinnie LABOR, PA-C    FINAL CLINICAL IMPRESSION(S) / ED DIAGNOSES   Final diagnoses:  Dizziness     Rx / DC Orders   ED Discharge Orders          Ordered    meclizine (ANTIVERT) 25 MG tablet  3 times daily PRN        11/24/23 2143             Note:  This document was prepared using Dragon voice recognition software and may include unintentional dictation errors.    Cleaster Tinnie LABOR, PA-C 11/24/23 2144    Claudene Rover, MD 11/25/23 2356

## 2023-11-24 NOTE — ED Provider Notes (Signed)
 [facility] Provider Note    Event Date/Time   First MD Initiated Contact with Patient 11/24/23 1858     (approximate)   History   Dizziness   HPI  Karen Hill is a 48 y.o. female who presents to the ED for evaluation of Dizziness   Review of podiatry clinic visit from 8 days ago.  2 weeks ago she had ORIF of left ankle fracture  She presents to the ED for evaluation of acute dizziness and syncope superimposed on chronic pressure in her ears.  She reports months-years of pressure sensation behind her ears that is not significantly changed.  Hearing remains intact without other neurologic complaints.  Over the past 1 week she has had multiple episodes of positional dizziness with standing and reports that she may have blacked out a couple times as well.  Denies any chest pain, shortness of breath.  Reports feeling fine right now.  Has not eaten anything today.  No emesis or diarrhea.   Physical Exam   Triage Vital Signs: ED Triage Vitals  Encounter Vitals Group     BP 11/24/23 1608 114/79     Girls Systolic BP Percentile --      Girls Diastolic BP Percentile --      Boys Systolic BP Percentile --      Boys Diastolic BP Percentile --      Pulse Rate 11/24/23 1608 (!) 120     Resp 11/24/23 1608 20     Temp 11/24/23 1608 98 F (36.7 C)     Temp Source 11/24/23 1608 Oral     SpO2 11/24/23 1608 100 %     Weight 11/24/23 1605 205 lb (93 kg)     Height 11/24/23 1605 5' 9 (1.753 m)     Head Circumference --      Peak Flow --      Pain Score 11/24/23 1605 8     Pain Loc --      Pain Education --      Exclude from Growth Chart --     Most recent vital signs: Vitals:   11/24/23 1608  BP: 114/79  Pulse: (!) 120  Resp: 20  Temp: 98 F (36.7 C)  SpO2: 100%    General: Awake, no distress.  CV:  Good peripheral perfusion.  Resp:  Normal effort.  Abd:  No distention.  MSK:  No deformity noted.  Neuro:  No focal deficits  appreciated. Other:     ED Results / Procedures / Treatments   Labs (all labs ordered are listed, but only abnormal results are displayed) Labs Reviewed  BASIC METABOLIC PANEL WITH GFR - Abnormal; Notable for the following components:      Result Value   Potassium 3.4 (*)    Chloride 97 (*)    CO2 33 (*)    All other components within normal limits  CBC - Abnormal; Notable for the following components:   Hemoglobin 15.5 (*)    HCT 48.1 (*)    All other components within normal limits  TROPONIN T, HIGH SENSITIVITY - Abnormal; Notable for the following components:   Troponin T High Sensitivity 21 (*)    All other components within normal limits  PROTIME-INR  PREGNANCY, URINE  URINALYSIS, ROUTINE W REFLEX MICROSCOPIC  TROPONIN T, HIGH SENSITIVITY    EKG Treatments baseline.  Sinus tachycardia with rate of 120 bpm.  Normal axis and intervals.  No clear signs of acute ischemia.  RADIOLOGY  CT head interpreted by me without evidence of acute intracranial pathology CXR interpreted by me without evidence of acute cardiopulmonary pathology.  Official radiology report(s): CT Head Wo Contrast Result Date: 11/24/2023 EXAM: CT HEAD WITHOUT CONTRAST 11/24/2023 04:55:34 PM TECHNIQUE: CT of the head was performed without the administration of intravenous contrast. Automated exposure control, iterative reconstruction, and/or weight based adjustment of the mA/kV was utilized to reduce the radiation dose to as low as reasonably achievable. COMPARISON: Prior study dated 05/20/2007. CLINICAL HISTORY: Headache, increasing frequency or severity. FINDINGS: BRAIN AND VENTRICLES: No acute hemorrhage. No evidence of acute infarct. No hydrocephalus. No extra-axial collection. No mass effect or midline shift. ORBITS: No acute abnormality. SINUSES: No acute abnormality. SOFT TISSUES AND SKULL: No acute soft tissue abnormality. No skull fracture. IMPRESSION: 1. No acute intracranial abnormality. Electronically  signed by: Norman Gatlin MD 11/24/2023 05:05 PM EST RP Workstation: [id number]   DG Chest 2 View Result Date: 11/24/2023 CLINICAL DATA:  Dizziness lightheaded EXAM: DG CHEST 2V COMPARISON:  None Available. FINDINGS: The heart size and mediastinal contours are within normal limits. Both lungs are clear. The visualized skeletal structures are unremarkable. IMPRESSION: No active cardiopulmonary disease. Electronically Signed   By: Luke Bun M.D.   On: 11/24/2023 16:37    PROCEDURES and INTERVENTIONS:  Procedures  Medications  lactated ringers  bolus 1,000 mL (has no administration in time range)  ketorolac  (TORADOL ) 30 MG/ML injection 15 mg (has no administration in time range)  meclizine (ANTIVERT) tablet 25 mg (has no administration in time range)     IMPRESSION / MDM / ASSESSMENT AND PLAN / ED COURSE  I reviewed the triage vital signs and the nursing notes.  Differential diagnosis includes, but is not limited to, hypovolemia, AKI, vasovagal episode, PE,  {Patient presents with symptoms of an acute illness or injury that is potentially life-threatening.  Patient 2 weeks out from ankle surgery presents with intermittent positional dizziness, syncope and tachycardia.  Marginal troponin elevation and nonischemic EKG.  No chest discomfort or shortness of breath.  Normal CBC, metabolic panel and INR.  Reassuring CXR and CT head.  Will provide IV fluids, Toradol  for her chronic ear pressure and obtain CTA chest considering she is tachycardic to rule out PE.  Signed out to PA to follow-up in the study.      FINAL CLINICAL IMPRESSION(S) / ED DIAGNOSES   Final diagnoses:  None     Rx / DC Orders   ED Discharge Orders     None        Note:  This document was prepared using Dragon voice recognition software and may include unintentional dictation errors.   Claudene Rover, MD 11/24/23 [phone]

## 2023-11-24 NOTE — ED Notes (Signed)
 See triage note  Presents with dizziness which started about 6 months ago  Has become worse over the past few days

## 2023-11-24 NOTE — ED Triage Notes (Signed)
 First Nurse Note:  Pt via [facility] from home. Pt c/o dizziness, loss of appetite since yesterday. Pt had ankle surgery on the 31st. Pt is A&Ox4 and NAD, EMS reports 99% on RA, 108 HR, 125/82, 98 CBG

## 2023-11-24 NOTE — ED Notes (Signed)
 PT in no acute distress prior to discharge. Discharged instructions reviewed, all questions answered and pt verbalized understanding at this time. Pt has all belongings with them at time of discharge.

## 2023-11-24 NOTE — Discharge Instructions (Addendum)
 Your blood work and imaging today was reassuring.  I sent a medication to help with the dizziness to your pharmacy.  Please follow-up with your primary care provider.  Return to the emergency department with any worsening symptoms.

## 2023-11-27 ENCOUNTER — Other Ambulatory Visit: Payer: Self-pay | Admitting: Cardiology

## 2023-11-29 LAB — TSH+T3+FREE T4+T3 FREE
Free T-3: 3.6 pg/mL
Free T4 by Dialysis: 2 ng/dL — ABNORMAL HIGH
TSH: 0.1 uU/mL
Triiodothyronine (T-3), Serum: 129 ng/dL

## 2023-11-29 LAB — SPECIMEN STATUS REPORT

## 2023-11-30 ENCOUNTER — Other Ambulatory Visit: Payer: Self-pay | Admitting: Family

## 2023-11-30 ENCOUNTER — Encounter: Admitting: Podiatry

## 2023-12-02 ENCOUNTER — Ambulatory Visit: Admitting: Family

## 2023-12-02 ENCOUNTER — Encounter: Payer: Self-pay | Admitting: Family

## 2023-12-02 MED ORDER — CETIRIZINE HCL 10 MG PO TABS: 10.0000 mg | ORAL_TABLET | Freq: Every day | ORAL | 2 refills | Status: AC

## 2023-12-02 MED ORDER — DOXYCYCLINE HYCLATE 100 MG PO CAPS: 100.0000 mg | ORAL_CAPSULE | Freq: Two times a day (BID) | ORAL | 0 refills | Status: AC

## 2023-12-02 MED ORDER — IPRATROPIUM BROMIDE 0.06 % NA SOLN: 2.0000 | Freq: Three times a day (TID) | NASAL | 2 refills | Status: AC

## 2023-12-03 NOTE — Progress Notes (Unsigned)
 Established Patient Office Visit  Subjective:  Patient ID: Karen Hill, female    DOB: [birth year]  Age: 48 y.o. MRN: [id number]  Chief Complaint  Patient presents with   Otalgia   Dizziness    HPI  No other concerns at this time.   Past Medical History:  Diagnosis Date   Dyspnea    GERD (gastroesophageal reflux disease)    Hypothyroidism    Hypothyroidism    Liver hemangioma    Lymphedema    OSA (obstructive sleep apnea)    a.) does not require nocturnal PAP therapy    Past Surgical History:  Procedure Laterality Date   CESAREAN SECTION  2014   CHOLECYSTECTOMY  2005   COLONOSCOPY WITH PROPOFOL  N/A 03/09/2023   Procedure: COLONOSCOPY WITH PROPOFOL ;  Surgeon: Therisa Bi, MD;  Location: [facility] ENDOSCOPY;  Service: Gastroenterology;  Laterality: N/A;   INSERTION OF MESH  07/14/2023   Procedure: INSERTION OF MESH;  Surgeon: Jordis Laneta FALCON, MD;  Location: [facility] ORS;  Service: General;;   MANDIBLE FRACTURE SURGERY     MULTIPLE TOOTH EXTRACTIONS     ORIF ANKLE FRACTURE Left 11/11/2023   Procedure: OPEN REDUCTION INTERNAL FIXATION (ORIF) ANKLE FRACTURE;  Surgeon: Silva Juliene SAUNDERS, DPM;  Location: [facility] ORS;  Service: Orthopedics/Podiatry;  Laterality: Left;  BLOCK,OPEN TREATMENT OF MEDIAL MALLEOLUS FRACTURE INCLUDING INTERNAL FIXATION WHEN PERFORMED   SYNDESMOSIS REPAIR Left 11/11/2023   Procedure: REPAIR, SYNDESMOSIS, ANKLE;  Surgeon: Silva Juliene SAUNDERS, DPM;  Location: [facility] ORS;  Service: Orthopedics/Podiatry;  Laterality: Left;  OPEN TREATMENT OF DISTAL TIBIAL FIBULAR JOINT DISRUPTION, INCLUDING INTERNAL FIXATION WHEN PERFORMED   TUBAL LIGATION  2014   XI ROBOTIC ASSISTED VENTRAL HERNIA N/A 07/14/2023   Procedure: REPAIR, HERNIA, VENTRAL, ROBOT-ASSISTED CONVERTED TO OPEN PROCEDURE;  Surgeon: Jordis Laneta FALCON, MD;  Location: [facility] ORS;  Service: General;  Laterality: N/A;    Social History   Socioeconomic History   Marital status: Married    Spouse name: Not on file   Number of  children: Not on file   Years of education: Not on file   Highest education level: Not on file  Occupational History   Not on file  Tobacco Use   Smoking status: Every Day    Current packs/day: 1.00    Average packs/day: 1 pack/day for 10.0 years (10.0 ttl pk-yrs)    Types: Cigarettes    Passive exposure: Past   Smokeless tobacco: Never  Vaping Use   Vaping status: Former  Substance and Sexual Activity   Alcohol use: Not Currently    Comment: rare   Drug use: No   Sexual activity: Not on file  Other Topics Concern   Not on file  Social History Narrative   Not on file   Social Drivers of Health   Financial Resource Strain: Not on file  Food Insecurity: No Food Insecurity (07/16/2023)   Hunger Vital Sign    Worried About Running Out of Food in the Last Year: Never true    Ran Out of Food in the Last Year: Never true  Transportation Needs: No Transportation Needs (07/16/2023)   PRAPARE - Administrator, Civil Service (Medical): No    Lack of Transportation (Non-Medical): No  Physical Activity: Not on file  Stress: Not on file  Social Connections: Not on file  Intimate Partner Violence: Not At Risk (07/16/2023)   Humiliation, Afraid, Rape, and Kick questionnaire    Fear of Current or Ex-Partner: No    Emotionally  Abused: No    Physically Abused: No    Sexually Abused: No    Family History  Problem Relation Age of Onset   Diabetes Mother    Hypertension Father    Breast cancer Neg Hx     Allergies  Allergen Reactions   Penicillin G Hives and Itching    Tolerated first generation cephalosporin (CEFAZOLIN ) in the past with no documented ADRs   Amoxicillin Hives    Tolerated first generation cephalosporin (CEFAZOLIN ) in the past with no documented ADRs.   Shellfish Allergy Nausea And Vomiting    Review of Systems  All other systems reviewed and are negative.      Objective:   BP 100/68   Pulse (!) 106   Ht 5' 6 (1.676 m)   LMP 04/26/2023  (Approximate)   SpO2 98%   BMI 33.09 kg/m   Vitals:   12/02/23 1411  BP: 100/68  Pulse: (!) 106  Height: 5' 6 (1.676 m)  Weight: Comment: unable to weigh today  SpO2: 98%    Physical Exam Vitals and nursing note reviewed.  Constitutional:      Appearance: Normal appearance. She is normal weight.  HENT:     Head: Normocephalic.  Eyes:     Extraocular Movements: Extraocular movements intact.     Conjunctiva/sclera: Conjunctivae normal.     Pupils: Pupils are equal, round, and reactive to light.  Cardiovascular:     Rate and Rhythm: Normal rate.  Pulmonary:     Effort: Pulmonary effort is normal.  Neurological:     General: No focal deficit present.     Mental Status: She is alert and oriented to person, place, and time. Mental status is at baseline.  Psychiatric:        Mood and Affect: Mood normal.        Behavior: Behavior normal.        Thought Content: Thought content normal.      No results found for any visits on 12/02/23.  Recent Results (from the past 2160 hours)  Lipid panel     Status: None   Collection Time: 09/15/23 10:57 AM  Result Value Ref Range   Cholesterol, Total 103 100 - 199 mg/dL   Triglycerides 87 0 - 149 mg/dL   HDL 56 >60 mg/dL   VLDL Cholesterol Cal 17 5 - 40 mg/dL   LDL Chol Calc (NIH) 30 0 - 99 mg/dL   Chol/HDL Ratio 1.8 0.0 - 4.4 ratio    Comment:                                   T. Chol/HDL Ratio                                             Men  Women                               1/2 Avg.Risk  3.4    3.3                                   Avg.Risk  5.0    4.4  2X Avg.Risk  9.6    7.1                                3X Avg.Risk 23.4   11.0   VITAMIN D  25 Hydroxy (Vit-D Deficiency, Fractures)     Status: None   Collection Time: 09/15/23 10:57 AM  Result Value Ref Range   Vit D, 25-Hydroxy 44.5 30.0 - 100.0 ng/mL    Comment: Vitamin D  deficiency has been defined by the Institute of Medicine and an  Endocrine Society practice guideline as a level of serum 25-OH vitamin D  less than 20 ng/mL (1,2). The Endocrine Society went on to further define vitamin D  insufficiency as a level between 21 and 29 ng/mL (2). 1. IOM (Institute of Medicine). 2010. Dietary reference    intakes for calcium  and D. [place]: The    [employer]. 2. Holick MF, Binkley [place], Bischoff-Ferrari HA, et al.    Evaluation, treatment, and prevention of vitamin D     deficiency: an Endocrine Society clinical practice    guideline. JCEM. 2011 Jul; 96(7):1911-30.   CMP14+EGFR     Status: Abnormal   Collection Time: 09/15/23 10:57 AM  Result Value Ref Range   Glucose 82 70 - 99 mg/dL   BUN 4 (L) 6 - 24 mg/dL   Creatinine, Ser 9.34 0.57 - 1.00 mg/dL   eGFR 889 >40 fO/fpw/8.26   BUN/Creatinine Ratio 6 (L) 9 - 23   Sodium 146 (H) 134 - 144 mmol/L   Potassium 3.2 (L) 3.5 - 5.2 mmol/L   Chloride 104 96 - 106 mmol/L   CO2 29 20 - 29 mmol/L   Calcium  8.8 8.7 - 10.2 mg/dL   Total Protein 5.5 (L) 6.0 - 8.5 g/dL   Albumin  3.2 (L) 3.9 - 4.9 g/dL   Globulin, Total 2.3 1.5 - 4.5 g/dL   Bilirubin Total 0.6 0.0 - 1.2 mg/dL   Alkaline Phosphatase 119 44 - 121 IU/L    Comment: **Effective September 26, 2023 Alkaline Phosphatase**   reference interval will be changing to:              Age                Female          Female           0 -  5 days         47 - 127       47 - 127           6 - 10 days         29 - 242       29 - 242          11 - 20 days        109 - 357      109 - 357          21 - 30 days         94 - 494       94 - 494           1 -  2 months      149 - 539      149 - 539           3 -  6 months      131 - 452      131 -  452           7 - 11 months      117 - 401      117 - 401   12 months -  6 years       158 - 369      158 - 369           7 - 12 years       150 - 409      150 - 409               13 years       156 - 435       78 - 227               14 years       114 - 375       64 - 161                15 years        88 - 279       56 - 134               16 years        74 - 207       51 - 121               17 years        63 - 161       47 - 113          18 - 20 years        51 - 125       42 - 106          21 - 50 years         47 - 123       41 - 116          51 - 80 years        49 - 135       51 - 125              >80 years        48 - 129       48 - 129    AST 15 0 - 40 IU/L   ALT 7 0 - 32 IU/L  TSH     Status: Abnormal   Collection Time: 09/15/23 10:57 AM  Result Value Ref Range   TSH 0.048 (L) 0.450 - 4.500 uIU/mL  Hemoglobin A1c     Status: Abnormal   Collection Time: 09/15/23 10:57 AM  Result Value Ref Range   Hgb A1c MFr Bld 4.6 (L) 4.8 - 5.6 %    Comment:          Prediabetes: 5.7 - 6.4          Diabetes: >6.4          Glycemic control for adults with diabetes: <7.0    Est. average glucose Bld gHb Est-mCnc 85 mg/dL  Vitamin B12     Status: None   Collection Time: 09/15/23 10:57 AM  Result Value Ref Range   Vitamin B-12 853 232 - 1,245 pg/mL  CBC with Diff     Status: Abnormal   Collection Time: 09/15/23 10:57 AM  Result Value Ref Range   WBC 8.9 3.4 - 10.8 x10E3/uL   RBC 4.13 3.77 - 5.28 x10E6/uL   Hemoglobin 12.6 11.1 -  15.9 g/dL   Hematocrit 59.0 65.9 - 46.6 %   MCV 99 (H) 79 - 97 fL   MCH 30.5 26.6 - 33.0 pg   MCHC 30.8 (L) 31.5 - 35.7 g/dL   RDW 87.2 88.2 - 84.5 %   Platelets 234 150 - 450 x10E3/uL   Neutrophils 67 Not Estab. %   Lymphs 28 Not Estab. %   Monocytes 4 Not Estab. %   Eos 1 Not Estab. %   Basos 0 Not Estab. %   Neutrophils Absolute 5.9 1.4 - 7.0 x10E3/uL   Lymphocytes Absolute 2.5 0.7 - 3.1 x10E3/uL   Monocytes Absolute 0.3 0.1 - 0.9 x10E3/uL   EOS (ABSOLUTE) 0.1 0.0 - 0.4 x10E3/uL   Basophils Absolute 0.0 0.0 - 0.2 x10E3/uL   Immature Granulocytes 0 Not Estab. %   Immature Grans (Abs) 0.0 0.0 - 0.1 x10E3/uL  Basic metabolic panel per protocol     Status: Abnormal   Collection Time: 11/10/23  9:52 AM  Result Value Ref  Range   Sodium 141 135 - 145 mmol/L   Potassium 3.0 (L) 3.5 - 5.1 mmol/L   Chloride 99 98 - 111 mmol/L   CO2 31 22 - 32 mmol/L   Glucose, Bld 78 70 - 99 mg/dL    Comment: Glucose reference range applies only to samples taken after fasting for at least 8 hours.   BUN 10 6 - 20 mg/dL   Creatinine, Ser 9.44 0.44 - 1.00 mg/dL   Calcium  8.9 8.9 - 10.3 mg/dL   GFR, Estimated >39 >39 mL/min    Comment: (NOTE) Calculated using the CKD-EPI Creatinine Equation (2021)    Anion gap 11 5 - 15    Comment: Performed at [facility], [address] [place], [place] [address]  Magnesium     Status: None   Collection Time: 11/10/23  9:52 AM  Result Value Ref Range   Magnesium 2.0 1.7 - 2.4 mg/dL    Comment: Performed at [facility], [address] [place], [place] [address]  Pregnancy, urine POC     Status: Abnormal   Collection Time: 11/11/23 11:58 AM  Result Value Ref Range   Preg Test, Ur POSITIVE (A) NEGATIVE    Comment:        THE SENSITIVITY OF THIS METHODOLOGY IS >20 mIU/mL.   hCG, Quantitative, Pregnancy (serum - [facility])     Status: Abnormal   Collection Time: 11/11/23 12:08 PM  Result Value Ref Range   hCG, Beta Chain, Quant, S 5 (H) <5 mIU/mL    Comment:          GEST. AGE      CONC.  (mIU/mL)   <=1 WEEK        5 - 50     2 WEEKS       50 - 500     3 WEEKS       100 - 10,000     4 WEEKS     1,000 - 30,000     5 WEEKS     3,500 - 115,000   6-8 WEEKS     12,000 - 270,000    12 WEEKS     15,000 - 220,000        FEMALE AND NON-PREGNANT FEMALE:     LESS THAN 5 mIU/mL Performed at [facility], [address], [place], [place] [address]   I-STAT, [place] 8     Status: Abnormal   Collection Time: 11/11/23  12:12 PM  Result Value Ref Range   Sodium 141 135 - 145 mmol/L   Potassium 3.6 3.5 - 5.1 mmol/L   Chloride 101 98 - 111 mmol/L   BUN 7 6 - 20 mg/dL   Creatinine, Ser 9.39 0.44 - 1.00 mg/dL   Glucose, Bld 68 (L) 70 - 99 mg/dL    Comment: Glucose  reference range applies only to samples taken after fasting for at least 8 hours.   Calcium , Ion 1.11 (L) 1.15 - 1.40 mmol/L   TCO2 27 22 - 32 mmol/L   Hemoglobin 14.6 12.0 - 15.0 g/dL   HCT 56.9 63.9 - 53.9 %  CMP14+EGFR     Status: Abnormal   Collection Time: 11/16/23  1:05 PM  Result Value Ref Range   Glucose 74 70 - 99 mg/dL   BUN 8 6 - 24 mg/dL   Creatinine, Ser 9.33 0.57 - 1.00 mg/dL   eGFR 890 >40 fO/fpw/8.26   BUN/Creatinine Ratio 12 9 - 23   Sodium 142 134 - 144 mmol/L   Potassium 3.7 3.5 - 5.2 mmol/L   Chloride 97 96 - 106 mmol/L   CO2 27 20 - 29 mmol/L   Calcium  9.5 8.7 - 10.2 mg/dL   Total Protein 5.9 (L) 6.0 - 8.5 g/dL   Albumin  3.3 (L) 3.9 - 4.9 g/dL   Globulin, Total 2.6 1.5 - 4.5 g/dL   Bilirubin Total 0.8 0.0 - 1.2 mg/dL   Alkaline Phosphatase 111 41 - 116 IU/L   AST 21 0 - 40 IU/L   ALT 11 0 - 32 IU/L  TSH     Status: Abnormal   Collection Time: 11/16/23  1:05 PM  Result Value Ref Range   TSH 0.096 (L) 0.450 - 4.500 uIU/mL  Thyroid Function Panel (THS+T3+T4+Free)     Status: Abnormal   Collection Time: 11/16/23  1:05 PM  Result Value Ref Range   TSH 0.10 uU/mL    Comment: Reference Range: Non-Pregnant Adult 0.450-4.500 Pregnancy First Trimester 0.100-4.000 Second Trimester 0.200-4.000 Third Trimester 0.300-4.500    Triiodothyronine (T-3), Serum 129 ng/dL    Comment: Reference Range: Adults: 55 - 170    Free T-3 3.6 pg/mL    Comment: Reference Range: >=20y: 2.0 - 4.4    Free T4 by Dialysis 2.0 (H) ng/dL    Comment: This test was developed and its performance characteristics determined by [facility]. It has not been cleared or approved by the Food and Drug Administration.  Reference Range: Pubertal Children and Adults: 0.8 - 1.7   Specimen status report     Status: None   Collection Time: 11/16/23  1:05 PM  Result Value Ref Range   specimen status report Comment     Comment: Written Authorization Written Authorization Written Authorization  Received. Authorization received from Rosina Mae for [employer] on 11-17-2023 Logged by Joesph Ion   Basic metabolic panel     Status: Abnormal   Collection Time: 11/24/23  4:09 PM  Result Value Ref Range   Sodium 140 135 - 145 mmol/L   Potassium 3.4 (L) 3.5 - 5.1 mmol/L   Chloride 97 (L) 98 - 111 mmol/L   CO2 33 (H) 22 - 32 mmol/L   Glucose, Bld 90 70 - 99 mg/dL    Comment: Glucose reference range applies only to samples taken after fasting for at least 8 hours.   BUN 7 6 - 20 mg/dL   Creatinine, Ser 9.37 0.44 - 1.00 mg/dL   Calcium  9.6  8.9 - 10.3 mg/dL   GFR, Estimated >39 >39 mL/min    Comment: (NOTE) Calculated using the CKD-EPI Creatinine Equation (2021)    Anion gap 10 5 - 15    Comment: Performed at [facility], [address] [place], [place] [address]  CBC     Status: Abnormal   Collection Time: 11/24/23  4:09 PM  Result Value Ref Range   WBC 5.7 4.0 - 10.5 K/uL   RBC 4.87 3.87 - 5.11 MIL/uL   Hemoglobin 15.5 (H) 12.0 - 15.0 g/dL   HCT 51.8 (H) 63.9 - 53.9 %   MCV 98.8 80.0 - 100.0 fL   MCH 31.8 26.0 - 34.0 pg   MCHC 32.2 30.0 - 36.0 g/dL   RDW 86.5 88.4 - 84.4 %   Platelets 271 150 - 400 K/uL   nRBC 0.0 0.0 - 0.2 %    Comment: Performed at [facility], [address] [place], [place] [address]  Troponin T, High Sensitivity     Status: Abnormal   Collection Time: 11/24/23  4:09 PM  Result Value Ref Range   Troponin T High Sensitivity 21 (H) 0 - 19 ng/L    Comment: (NOTE) Biotin concentrations > 1000 ng/mL falsely decrease TnT results.  Serial cardiac troponin measurements are suggested.  Refer to the Links section for chest pain algorithms and additional  guidance. Performed at [facility], [address] [place], [place] [address]   Protime-INR (order if Patient is taking Coumadin / Warfarin)     Status: None   Collection Time: 11/24/23  4:09 PM  Result Value Ref Range   Prothrombin Time 12.9 11.4 - 15.2  seconds   INR 0.9 0.8 - 1.2    Comment: (NOTE) INR goal varies based on device and disease states. Performed at [facility], [address] [place], [place] [address]   Pregnancy, urine     Status: None   Collection Time: 11/24/23  4:45 PM  Result Value Ref Range   Preg Test, Ur NEGATIVE NEGATIVE    Comment:        THE SENSITIVITY OF THIS METHODOLOGY IS >20 mIU/mL. Performed at [facility], [address] [place], [place] [address]   Troponin T, High Sensitivity     Status: Abnormal   Collection Time: 11/24/23  8:04 PM  Result Value Ref Range   Troponin T High Sensitivity 21 (H) 0 - 19 ng/L    Comment: (NOTE) Biotin concentrations > 1000 ng/mL falsely decrease TnT results.  Serial cardiac troponin measurements are suggested.  Refer to the Links section for chest pain algorithms and additional  guidance. Performed at [facility], [address] [place], [place] [address]   Urinalysis, Routine w reflex microscopic -Urine, Clean Catch     Status: Abnormal   Collection Time: 11/24/23  8:04 PM  Result Value Ref Range   Color, Urine YELLOW (A) YELLOW   APPearance HAZY (A) CLEAR   Specific Gravity, Urine 1.008 1.005 - 1.030   pH 7.0 5.0 - 8.0   Glucose, UA NEGATIVE NEGATIVE mg/dL   Hgb urine dipstick NEGATIVE NEGATIVE   Bilirubin Urine NEGATIVE NEGATIVE   Ketones, ur NEGATIVE NEGATIVE mg/dL   Protein, ur NEGATIVE NEGATIVE mg/dL   Nitrite NEGATIVE NEGATIVE   Leukocytes,Ua NEGATIVE NEGATIVE    Comment: Performed at [facility], [address], [place], [place] [address]       Assessment & Plan:   Assessment & Plan  No follow-ups on file.   Total time spent: {AMA time spent:29001} minutes  ALAN CHRISTELLA ARRANT, FNP  12/02/2023   This document may have been prepared by [facility] Voice Recognition software and as such may include unintentional dictation errors.

## 2023-12-05 ENCOUNTER — Encounter: Payer: Self-pay | Admitting: Podiatry

## 2023-12-05 ENCOUNTER — Ambulatory Visit (INDEPENDENT_AMBULATORY_CARE_PROVIDER_SITE_OTHER): Admitting: Podiatry

## 2023-12-05 DIAGNOSIS — S82892D Other fracture of left lower leg, subsequent encounter for closed fracture with routine healing: Secondary | ICD-10-CM

## 2023-12-05 NOTE — Progress Notes (Signed)
  Subjective:  Patient ID: Karen Hill, female    DOB: Mar 07, 1975,  MRN: [id number]  Chief Complaint  Patient presents with   Routine Post Op    POV #2 DOS 11/11/2023 LT OPEN TREATMENT OF MEDIAL MALLEOLUS FX, INC FIXATION/ LT OPEN TREATMENT OF DISTAL TIBIOFIBULAR JOINT DISRUPTION INC INTERNAL FIXATION    48 y.o. female returns for post-op check.  .  Review of Systems: Negative except as noted in the HPI. Denies N/V/F/Ch.   Objective:  There were no vitals filed for this visit. There is no height or weight on file to calculate BMI. Constitutional Well developed. Well nourished.  Vascular Foot warm and well perfused. Capillary refill normal to all digits.  Calf is soft and supple, no posterior calf or knee pain, negative Homans' sign  Neurologic Normal speech. Oriented to person, place, and time. Epicritic sensation to light touch grossly present bilaterally.  Dermatologic Incision is well-healed and not hypertrophic.  Cast is intact has some evidence of weightbearing.  Orthopedic: Tenderness to palpation noted about the surgical site.   Multiple view plain film radiographs: No change in immediate postoperative films, all hardware intact and in position Assessment:   1. Fracture, ankle, left, closed, with routine healing, subsequent encounter    Plan:  Patient was evaluated and treated and all questions answered.  S/p foot surgery left - Staples removed uneventfully.  She returned to the CAM boot today, advised her to continue to be nonweightbearing as much as possible which has been difficult for her.  Maintain cam boot and ice and elevation until next visit.  Follow-up as scheduled in 3 weeks for new x-rays.  Return in about 23 days (around 12/28/2023).

## 2023-12-05 NOTE — Progress Notes (Deleted)
 Referring Physician:  Orlean Alan HERO, FNP [address] [place],  [place] [address]  Primary Physician:  Orlean Alan HERO, FNP  History of Present Illness: [name] has a history of hypothyroidism, prediabetes, lymphedema of lower extremities, and obesity.   Last seen by me on 03/16/23 for for right sided back and leg pain. She has known DDD L4-S1, worse at L5-S1 with slight slip. Also with mild bilateral foraminal stenosis L4-L5 and right disc L5-S1 that compresses S1 nerve with mild right and mild/moderate left foraminal stenosis.   She started PT at [facility] on 05/25/23. She was discharged on 07/07/23 due to upcoming surgery.   She has repair of recurrent ventral hernia on 07/14/23 by Dr. Jordis.   She is here for follow up.   No improvement at all with PT. She continues with constant right sided LBP with right lateral/posterior leg pain to her foot. No left leg pain. She has numbness and tingling in her right foot. Pain is worse with bending and staying in any prolonged position.   She was 89 at last visit, she has lost 6 more pounds.   She is taking baclofen  and neurontin . These help very little.   She smokes 2 PPD x 15 years. She is working on quitting- it is not going well.   Bowel/Bladder Dysfunction: none  Conservative measures:  Physical therapy: has not participated in PT Multimodal medical therapy including regular antiinflammatories:  Gabapentin , Prednisone  Injections: no epidural steroid injections  Past Surgery: no spinal surgeries  Karen Hill has no symptoms of cervical myelopathy.  The symptoms are causing a significant impact on the patient's life.   Review of Systems:  A 10 point review of systems is negative, except for the pertinent positives and negatives detailed in the HPI.  Past Medical History: Past Medical History:  Diagnosis Date   Dyspnea    GERD (gastroesophageal reflux disease)    Hypothyroidism    Hypothyroidism     Liver hemangioma    Lymphedema    OSA (obstructive sleep apnea)    a.) does not require nocturnal PAP therapy    Past Surgical History: Past Surgical History:  Procedure Laterality Date   CESAREAN SECTION  2014   CHOLECYSTECTOMY  2005   COLONOSCOPY WITH PROPOFOL  N/A 03/09/2023   Procedure: COLONOSCOPY WITH PROPOFOL ;  Surgeon: Therisa Bi, MD;  Location: [facility] ENDOSCOPY;  Service: Gastroenterology;  Laterality: N/A;   INSERTION OF MESH  07/14/2023   Procedure: INSERTION OF MESH;  Surgeon: Jordis Laneta FALCON, MD;  Location: [facility] ORS;  Service: General;;   MANDIBLE FRACTURE SURGERY     MULTIPLE TOOTH EXTRACTIONS     ORIF ANKLE FRACTURE Left 11/11/2023   Procedure: OPEN REDUCTION INTERNAL FIXATION (ORIF) ANKLE FRACTURE;  Surgeon: Silva Juliene SAUNDERS, DPM;  Location: [facility] ORS;  Service: Orthopedics/Podiatry;  Laterality: Left;  BLOCK,OPEN TREATMENT OF MEDIAL MALLEOLUS FRACTURE INCLUDING INTERNAL FIXATION WHEN PERFORMED   SYNDESMOSIS REPAIR Left 11/11/2023   Procedure: REPAIR, SYNDESMOSIS, ANKLE;  Surgeon: Silva Juliene SAUNDERS, DPM;  Location: [facility] ORS;  Service: Orthopedics/Podiatry;  Laterality: Left;  OPEN TREATMENT OF DISTAL TIBIAL FIBULAR JOINT DISRUPTION, INCLUDING INTERNAL FIXATION WHEN PERFORMED   TUBAL LIGATION  2014   XI ROBOTIC ASSISTED VENTRAL HERNIA N/A 07/14/2023   Procedure: REPAIR, HERNIA, VENTRAL, ROBOT-ASSISTED CONVERTED TO OPEN PROCEDURE;  Surgeon: Jordis Laneta FALCON, MD;  Location: [facility] ORS;  Service: General;  Laterality: N/A;    Allergies: Allergies as of 12/13/2023 - Review Complete 12/02/2023  Allergen Reaction Noted  Penicillin g Hives and Itching 11/08/2013   Amoxicillin Hives 04/01/2022   Shellfish allergy Nausea And Vomiting 03/12/2016    Medications: Outpatient Encounter Medications as of 12/13/2023  Medication Sig   albuterol  (PROAIR  HFA) 108 (90 Base) MCG/ACT inhaler Inhale 2 puffs into the lungs every 6 (six) hours as needed for wheezing or shortness of breath.   baclofen   (LIORESAL ) 10 MG tablet TAKE 1 TABLET BY MOUTH TWICE A DAY AS NEEDED FOR MUSCLE SPASM (Patient not taking: Reported on 12/02/2023)   cetirizine  (ZYRTEC  ALLERGY) 10 MG tablet Take 1 tablet (10 mg total) by mouth daily.   Cholecalciferol (VITAMIN D3) 50 MCG (2000 UT) TABS Take 2,000 Units by mouth in the morning.   cyanocobalamin  (VITAMIN B12) 1000 MCG/ML injection INJECT 1 ML (1,000 MCG TOTAL) INTO THE MUSCLE EVERY 14 (FOURTEEN) DAYS.   doxycycline  (VIBRAMYCIN ) 100 MG capsule Take 1 capsule (100 mg total) by mouth 2 (two) times daily.   enoxaparin  (LOVENOX ) 40 MG/0.4ML injection Inject 0.4 mLs (40 mg total) into the skin daily.   ferrous sulfate 325 (65 FE) MG tablet Take 325 mg by mouth in the morning.   gabapentin  (NEURONTIN ) 600 MG tablet Take 1,200 mg by mouth 3 (three) times daily.   ipratropium (ATROVENT ) 0.06 % nasal spray Place 2 sprays into the nose 3 (three) times daily.   lansoprazole  (PREVACID ) 30 MG capsule Take 1 capsule (30 mg total) by mouth daily at 12 noon.   levothyroxine  (SYNTHROID ) 125 MCG tablet TAKE 1 TABLET BY MOUTH EVERY DAY BEFORE BREAKFAST   linaclotide  (LINZESS ) 145 MCG CAPS capsule Take 1 capsule (145 mcg total) by mouth daily before breakfast. (Patient taking differently: Take 145 mcg by mouth as needed (constipation).)   meclizine  (ANTIVERT ) 25 MG tablet Take 1 tablet (25 mg total) by mouth 3 (three) times daily as needed for dizziness.   potassium chloride  SA (KLOR-CON  M) 20 MEQ tablet Take 2 tablets (40 mEq) today, then 2 tablets (40 mEq) prior to coming in for surgery tomorrow.  Follow-up with PCP for repeat labs.   rosuvastatin  (CRESTOR ) 5 MG tablet TAKE 1 TABLET (5 MG TOTAL) BY MOUTH DAILY.   Syringe/Needle, Disp, (SYRINGE 3CC/20GX1) 20G X 1 3 ML MISC 1 each by Does not apply route every 14 (fourteen) days. For use with B12 injections   Vitamin D , Ergocalciferol , (DRISDOL ) 1.25 MG (50000 UNIT) CAPS capsule Take 1 capsule (50,000 Units total) by mouth every 7  (seven) days.   No facility-administered encounter medications on file as of 12/13/2023.    Social History: Social History   Tobacco Use   Smoking status: Every Day    Current packs/day: 1.00    Average packs/day: 1 pack/day for 10.0 years (10.0 ttl pk-yrs)    Types: Cigarettes    Passive exposure: Past   Smokeless tobacco: Never  Vaping Use   Vaping status: Former  Substance Use Topics   Alcohol use: Not Currently    Comment: rare   Drug use: No    Family Medical History: Family History  Problem Relation Age of Onset   Diabetes Mother    Hypertension Father    Breast cancer Neg Hx     Physical Examination: There were no vitals filed for this visit.    Awake, alert, oriented to person, place, and time.  Speech is clear and fluent. Fund of knowledge is appropriate.   Cranial Nerves: Pupils equal round and reactive to light.  Facial tone is symmetric.    She has  right sided lower lumbar tenderness.   No abnormal lesions on exposed skin.   Strength: Side Iliopsoas Quads Hamstring PF DF EHL  R 5 5 5 5 5 5   L 5 5 5 5 5 5      Clonus is not present.   Bilateral lower extremity sensation is intact to light touch.     Has known lymphedema in both legs.   Gait is slow.   Medical Decision Making  Imaging: None   Assessment and Plan: She had no improvement with PT. She continues with constant right sided LBP with right lateral/posterior leg pain to her foot. No left leg pain. She has numbness and tingling in her right foot.   She has known DDD L4-S1, worse at L5-S1 with slight slip. Alos with mild bilateral foraminal stenosis L4-L5 and right disc L5-S1 that compresses S1 nerve with mild right and mild/moderate left foraminal stenosis.   Treatment options discussed with patient and following plan made:   - Referral back to Dr. Marcelino to revisit injections. They were not approved previously as she did not complete PT.  - Smoking cessation discussed and encouraged.  She is up to 2 PPD.  - She is doing great on weight loss. She will continue to work on this.  - She is not interested in any surgery options.  - She will follow up with me in 2 months and prn.   I spent a total of 25 minutes in face-to-face and non-face-to-face activities related to this patient's care today including review of outside records, review of imaging, review of symptoms, physical exam, discussion of differential diagnosis, discussion of treatment options, and documentation.   Glade Boys PA-C Dept. of Neurosurgery

## 2023-12-07 ENCOUNTER — Ambulatory Visit: Admitting: "Endocrinology

## 2023-12-09 ENCOUNTER — Encounter: Payer: Self-pay | Admitting: Podiatry

## 2023-12-12 ENCOUNTER — Ambulatory Visit: Admitting: Orthopedic Surgery

## 2023-12-12 ENCOUNTER — Ambulatory Visit: Payer: Self-pay

## 2023-12-13 ENCOUNTER — Ambulatory Visit: Admitting: Orthopedic Surgery

## 2023-12-14 ENCOUNTER — Encounter: Payer: Self-pay | Admitting: Podiatry

## 2023-12-15 ENCOUNTER — Ambulatory Visit: Admitting: Family

## 2023-12-15 NOTE — Telephone Encounter (Signed)
 PA was approved 12/15/23-06/12/24

## 2023-12-21 ENCOUNTER — Encounter: Admitting: Podiatry

## 2023-12-28 ENCOUNTER — Ambulatory Visit: Admitting: Podiatry

## 2023-12-28 ENCOUNTER — Ambulatory Visit

## 2023-12-28 VITALS — Ht 66.0 in | Wt 205.0 lb

## 2023-12-28 DIAGNOSIS — Z9889 Other specified postprocedural states: Secondary | ICD-10-CM

## 2023-12-28 DIAGNOSIS — S82892D Other fracture of left lower leg, subsequent encounter for closed fracture with routine healing: Secondary | ICD-10-CM

## 2023-12-28 NOTE — Patient Instructions (Addendum)
 Call to schedule PT:  Phone: [phone] Address: [address], [place], [place] [address]

## 2023-12-29 ENCOUNTER — Encounter: Payer: Self-pay | Admitting: Family

## 2023-12-29 ENCOUNTER — Ambulatory Visit: Admitting: Family

## 2023-12-29 VITALS — BP 120/80 | HR 109 | Ht 66.0 in | Wt 174.6 lb

## 2023-12-29 DIAGNOSIS — R251 Tremor, unspecified: Secondary | ICD-10-CM

## 2023-12-29 DIAGNOSIS — Z1231 Encounter for screening mammogram for malignant neoplasm of breast: Secondary | ICD-10-CM

## 2023-12-29 DIAGNOSIS — E782 Mixed hyperlipidemia: Secondary | ICD-10-CM

## 2023-12-29 DIAGNOSIS — E538 Deficiency of other specified B group vitamins: Secondary | ICD-10-CM

## 2023-12-29 DIAGNOSIS — E039 Hypothyroidism, unspecified: Secondary | ICD-10-CM

## 2023-12-29 DIAGNOSIS — E559 Vitamin D deficiency, unspecified: Secondary | ICD-10-CM

## 2023-12-29 DIAGNOSIS — R42 Dizziness and giddiness: Secondary | ICD-10-CM

## 2023-12-29 DIAGNOSIS — R5383 Other fatigue: Secondary | ICD-10-CM

## 2023-12-29 DIAGNOSIS — R531 Weakness: Secondary | ICD-10-CM

## 2023-12-29 DIAGNOSIS — R7303 Prediabetes: Secondary | ICD-10-CM

## 2023-12-29 DIAGNOSIS — L659 Nonscarring hair loss, unspecified: Secondary | ICD-10-CM

## 2023-12-30 ENCOUNTER — Encounter (INDEPENDENT_AMBULATORY_CARE_PROVIDER_SITE_OTHER): Payer: Self-pay

## 2023-12-30 LAB — CBC WITH DIFFERENTIAL/PLATELET
Basophils Absolute: 0 x10E3/uL (ref 0.0–0.2)
Basos: 0 %
EOS (ABSOLUTE): 0.1 x10E3/uL (ref 0.0–0.4)
Eos: 1 %
Hematocrit: 43 % (ref 34.0–46.6)
Hemoglobin: 14.4 g/dL (ref 11.1–15.9)
Immature Grans (Abs): 0 x10E3/uL (ref 0.0–0.1)
Immature Granulocytes: 0 %
Lymphocytes Absolute: 2.3 x10E3/uL (ref 0.7–3.1)
Lymphs: 30 %
MCH: 33.2 pg — ABNORMAL HIGH (ref 26.6–33.0)
MCHC: 33.5 g/dL (ref 31.5–35.7)
MCV: 99 fL — ABNORMAL HIGH (ref 79–97)
Monocytes Absolute: 0.4 x10E3/uL (ref 0.1–0.9)
Monocytes: 5 %
Neutrophils Absolute: 4.7 x10E3/uL (ref 1.4–7.0)
Neutrophils: 64 %
Platelets: 270 x10E3/uL (ref 150–450)
RBC: 4.34 x10E6/uL (ref 3.77–5.28)
RDW: 12.7 % (ref 11.7–15.4)
WBC: 7.5 x10E3/uL (ref 3.4–10.8)

## 2023-12-30 LAB — CMP14+EGFR
ALT: 13 IU/L (ref 0–32)
AST: 17 IU/L (ref 0–40)
Albumin: 3.2 g/dL — ABNORMAL LOW (ref 3.9–4.9)
Alkaline Phosphatase: 103 IU/L (ref 41–116)
BUN/Creatinine Ratio: 8 — ABNORMAL LOW (ref 9–23)
BUN: 5 mg/dL — ABNORMAL LOW (ref 6–24)
Bilirubin Total: 0.8 mg/dL (ref 0.0–1.2)
CO2: 27 mmol/L (ref 20–29)
Calcium: 9.3 mg/dL (ref 8.7–10.2)
Chloride: 101 mmol/L (ref 96–106)
Creatinine, Ser: 0.59 mg/dL (ref 0.57–1.00)
Globulin, Total: 2.3 g/dL (ref 1.5–4.5)
Glucose: 77 mg/dL (ref 70–99)
Potassium: 4.4 mmol/L (ref 3.5–5.2)
Sodium: 141 mmol/L (ref 134–144)
Total Protein: 5.5 g/dL — ABNORMAL LOW (ref 6.0–8.5)
eGFR: 112 mL/min/1.73

## 2023-12-30 LAB — IRON,TIBC AND FERRITIN PANEL
Ferritin: 104 ng/mL (ref 15–150)
Iron Saturation: 51 % (ref 15–55)
Iron: 114 ug/dL (ref 27–159)
Total Iron Binding Capacity: 222 ug/dL — ABNORMAL LOW (ref 250–450)
UIBC: 108 ug/dL — ABNORMAL LOW (ref 131–425)

## 2023-12-30 LAB — HEMOGLOBIN A1C
Est. average glucose Bld gHb Est-mCnc: 82 mg/dL
Hgb A1c MFr Bld: 4.5 % — ABNORMAL LOW (ref 4.8–5.6)

## 2023-12-30 LAB — LIPID PANEL
Chol/HDL Ratio: 1.9 ratio (ref 0.0–4.4)
Cholesterol, Total: 126 mg/dL (ref 100–199)
HDL: 65 mg/dL
LDL Chol Calc (NIH): 44 mg/dL (ref 0–99)
Triglycerides: 91 mg/dL (ref 0–149)
VLDL Cholesterol Cal: 17 mg/dL (ref 5–40)

## 2023-12-30 LAB — VITAMIN D 25 HYDROXY (VIT D DEFICIENCY, FRACTURES): Vit D, 25-Hydroxy: 64.6 ng/mL (ref 30.0–100.0)

## 2023-12-30 LAB — TSH: TSH: 0.266 u[IU]/mL — ABNORMAL LOW (ref 0.450–4.500)

## 2023-12-30 LAB — VITAMIN B12: Vitamin B-12: 1763 pg/mL — ABNORMAL HIGH (ref 232–1245)

## 2023-12-30 LAB — MAGNESIUM: Magnesium: 1.7 mg/dL (ref 1.6–2.3)

## 2023-12-31 NOTE — Progress Notes (Signed)
"  °  Subjective:  Patient ID: Karen Hill, female    DOB: 06-03-1975,  MRN: [id number]  Chief Complaint  Patient presents with   Fracture    Rm 4 Patient is here to f/u on left ankle fracture.    48 y.o. female returns for post-op check.  .  Review of Systems: Negative except as noted in the HPI. Denies N/V/F/Ch.   Objective:  There were no vitals filed for this visit. Body mass index is 33.09 kg/m. Constitutional Well developed. Well nourished.  Vascular Foot warm and well perfused. Capillary refill normal to all digits.  Calf is soft and supple, no posterior calf or knee pain, negative Homans' sign  Neurologic Normal speech. Oriented to person, place, and time. Epicritic sensation to light touch grossly present bilaterally.  Dermatologic Incision is well-healed and not hypertrophic.     Orthopedic: Tenderness to palpation noted about the surgical site.   Multiple view plain film radiographs: Some interval fracture bridging but incomplete healing Assessment:   1. Fracture, ankle, left, closed, with routine healing, subsequent encounter    Plan:  Patient was evaluated and treated and all questions answered.  S/p foot surgery left - Improving. Gradual progressive WB OK still use walker for support. In 2 weeks can start full WB. PT referral placed. F/u in 6 weeks for new XR and should be OK to transition out of boot  Return in about 6 weeks (around 02/08/2024) for fracture f/u (new left ankle XR).  "

## 2024-01-04 ENCOUNTER — Other Ambulatory Visit: Payer: Self-pay

## 2024-01-04 ENCOUNTER — Ambulatory Visit: Payer: Self-pay

## 2024-01-04 LAB — SPECIMEN STATUS REPORT

## 2024-01-04 LAB — TSH+T4F+T3FREE
Free T4: 1.77 ng/dL (ref 0.82–1.77)
T3, Free: 2.9 pg/mL (ref 2.0–4.4)
TSH: 0.098 u[IU]/mL — ABNORMAL LOW (ref 0.450–4.500)

## 2024-01-04 MED ORDER — LEVOTHYROXINE SODIUM 100 MCG PO TABS: 100.0000 ug | ORAL_TABLET | Freq: Every day | ORAL | 11 refills | Status: AC

## 2024-01-16 ENCOUNTER — Encounter: Payer: Self-pay | Admitting: Podiatry

## 2024-01-16 ENCOUNTER — Ambulatory Visit: Admitting: Orthopedic Surgery

## 2024-01-18 ENCOUNTER — Other Ambulatory Visit (INDEPENDENT_AMBULATORY_CARE_PROVIDER_SITE_OTHER): Payer: Self-pay | Admitting: Nurse Practitioner

## 2024-01-18 DIAGNOSIS — M7989 Other specified soft tissue disorders: Secondary | ICD-10-CM

## 2024-01-18 MED ORDER — TRAMADOL HCL 50 MG PO TABS: 50.0000 mg | ORAL_TABLET | Freq: Four times a day (QID) | ORAL | 0 refills | Status: AC | PRN

## 2024-01-18 NOTE — Addendum Note (Signed)
 Addended byBETHA MEDICINE, Sirena Riddle R on: 01/18/2024 10:38 AM   Modules accepted: Orders

## 2024-01-25 ENCOUNTER — Encounter (INDEPENDENT_AMBULATORY_CARE_PROVIDER_SITE_OTHER): Payer: Self-pay | Admitting: Nurse Practitioner

## 2024-01-25 ENCOUNTER — Other Ambulatory Visit (INDEPENDENT_AMBULATORY_CARE_PROVIDER_SITE_OTHER)

## 2024-01-25 ENCOUNTER — Ambulatory Visit (INDEPENDENT_AMBULATORY_CARE_PROVIDER_SITE_OTHER): Admitting: Nurse Practitioner

## 2024-01-25 VITALS — BP 107/73 | HR 101 | Resp 17 | Ht 66.0 in | Wt 174.0 lb

## 2024-01-25 DIAGNOSIS — I890 Lymphedema, not elsewhere classified: Secondary | ICD-10-CM

## 2024-01-25 DIAGNOSIS — M7989 Other specified soft tissue disorders: Secondary | ICD-10-CM

## 2024-01-29 ENCOUNTER — Encounter (INDEPENDENT_AMBULATORY_CARE_PROVIDER_SITE_OTHER): Payer: Self-pay | Admitting: Nurse Practitioner

## 2024-01-29 NOTE — Progress Notes (Signed)
 "  Subjective:    Patient ID: Karen Hill, female    DOB: 30-Nov-1975, 49 y.o.   MRN: [id number] Chief Complaint  Patient presents with   Follow-up    pt conv + Bilat Venous Reflux    HPI  Discussed the use of AI scribe software for clinical note transcription with the patient, who gave verbal consent to proceed.  History of Present Illness Karen Hill is a 49 year old female with chronic lymphedema of both lower extremities who presents for evaluation of persistent lower extremity swelling and management of compression therapy.  Swelling of both lower extremities has been persistent, with predominant involvement of the right leg and foot and lesser symptoms in the left leg. There has been no recent worsening. Prior vascular studies showed no evidence of deep vein thrombosis, venous insufficiency, or arterial occlusion. She regularly wears compression socks and elevates her legs. She notes that intake of soda and salty foods exacerbates swelling, and intermittent substitution with water helps reduce symptoms.  She has been unable to use her pneumatic compression pump due to improper fit, as the device is too large. Previously, regular use of the pump helped control swelling. A referral for a new pump was placed, but she has not yet been contacted for fitting and measurement. She has been in a boot since late October 2025, which has reduced her activity level and contributed to increased swelling.  She experienced significant weight loss, decreasing from nearly 300 pounds to 175 pounds, primarily through the use of Wegovy . She feels that rapid weight loss led to loss of muscle mass and strength, particularly in her legs, resulting in difficulty with mobility and lower extremity function.  She describes involuntary movements of her foot, including twisting and rolling without voluntary control. She has a known history of S1 nerve compression in her back and is currently under care  for this condition. She expresses interest in returning to exercise, such as using an exercise bike, once she is out of the boot, and has support from a family member to join a gym.    Results Diagnostic Lower extremity venous duplex ultrasound: No evidence of deep vein thrombosis or venous insufficiency. No progression of venous disease.   Review of Systems  Cardiovascular:  Positive for leg swelling.  All other systems reviewed and are negative.      Objective:   Physical Exam Vitals reviewed.  HENT:     Head: Normocephalic.  Cardiovascular:     Rate and Rhythm: Normal rate.  Pulmonary:     Effort: Pulmonary effort is normal.  Musculoskeletal:     Right lower leg: Edema present.     Left lower leg: Edema present.  Skin:    General: Skin is warm and dry.  Neurological:     Mental Status: She is alert and oriented to person, place, and time.     Gait: Gait abnormal.  Psychiatric:        Mood and Affect: Mood normal.        Behavior: Behavior normal.        Thought Content: Thought content normal.        Judgment: Judgment normal.     Physical Exam MEASUREMENTS: Weight- 175.  BP 107/73   Pulse (!) 101   Resp 17   Ht 5' 6 (1.676 m)   Wt 174 lb (78.9 kg)   BMI 28.08 kg/m   Past Medical History:  Diagnosis Date   Dyspnea  GERD (gastroesophageal reflux disease)    Hypothyroidism    Hypothyroidism    Liver hemangioma    Lymphedema    OSA (obstructive sleep apnea)    a.) does not require nocturnal PAP therapy    Social History   Socioeconomic History   Marital status: Married    Spouse name: Not on file   Number of children: Not on file   Years of education: Not on file   Highest education level: Not on file  Occupational History   Not on file  Tobacco Use   Smoking status: Every Day    Current packs/day: 1.00    Average packs/day: 1 pack/day for 10.0 years (10.0 ttl pk-yrs)    Types: Cigarettes    Passive exposure: Past   Smokeless tobacco:  Never  Vaping Use   Vaping status: Former  Substance and Sexual Activity   Alcohol use: Not Currently    Comment: rare   Drug use: No   Sexual activity: Not on file  Other Topics Concern   Not on file  Social History Narrative   Not on file   Social Drivers of Health   Tobacco Use: High Risk (01/29/2024)   Patient History    Smoking Tobacco Use: Every Day    Smokeless Tobacco Use: Never    Passive Exposure: Past  Financial Resource Strain: Not on file  Food Insecurity: No Food Insecurity (07/16/2023)   Epic    Worried About Programme Researcher, Broadcasting/film/video in the Last Year: Never true    Ran Out of Food in the Last Year: Never true  Transportation Needs: No Transportation Needs (07/16/2023)   Epic    Lack of Transportation (Medical): No    Lack of Transportation (Non-Medical): No  Physical Activity: Not on file  Stress: Not on file  Social Connections: Not on file  Intimate Partner Violence: Not At Risk (07/16/2023)   Epic    Fear of Current or Ex-Partner: No    Emotionally Abused: No    Physically Abused: No    Sexually Abused: No  Depression (PHQ2-9): Low Risk (04/21/2023)   Depression (PHQ2-9)    PHQ-2 Score: 0  Alcohol Screen: Not on file  Housing: Low Risk (07/16/2023)   Epic    Unable to Pay for Housing in the Last Year: No    Number of Times Moved in the Last Year: 0    Homeless in the Last Year: No  Utilities: Not At Risk (07/16/2023)   Epic    Threatened with loss of utilities: No  Health Literacy: Not on file    Past Surgical History:  Procedure Laterality Date   CESAREAN SECTION  2014   CHOLECYSTECTOMY  2005   COLONOSCOPY WITH PROPOFOL  N/A 03/09/2023   Procedure: COLONOSCOPY WITH PROPOFOL ;  Surgeon: Therisa Bi, MD;  Location: [facility] ENDOSCOPY;  Service: Gastroenterology;  Laterality: N/A;   INSERTION OF MESH  07/14/2023   Procedure: INSERTION OF MESH;  Surgeon: Jordis Laneta FALCON, MD;  Location: [facility] ORS;  Service: General;;   MANDIBLE FRACTURE SURGERY     MULTIPLE TOOTH  EXTRACTIONS     ORIF ANKLE FRACTURE Left 11/11/2023   Procedure: OPEN REDUCTION INTERNAL FIXATION (ORIF) ANKLE FRACTURE;  Surgeon: Silva Juliene SAUNDERS, DPM;  Location: [facility] ORS;  Service: Orthopedics/Podiatry;  Laterality: Left;  BLOCK,OPEN TREATMENT OF MEDIAL MALLEOLUS FRACTURE INCLUDING INTERNAL FIXATION WHEN PERFORMED   SYNDESMOSIS REPAIR Left 11/11/2023   Procedure: REPAIR, SYNDESMOSIS, ANKLE;  Surgeon: Silva Juliene SAUNDERS, DPM;  Location: [facility] ORS;  Service: Orthopedics/Podiatry;  Laterality: Left;  OPEN TREATMENT OF DISTAL TIBIAL FIBULAR JOINT DISRUPTION, INCLUDING INTERNAL FIXATION WHEN PERFORMED   TUBAL LIGATION  2014   XI ROBOTIC ASSISTED VENTRAL HERNIA N/A 07/14/2023   Procedure: REPAIR, HERNIA, VENTRAL, ROBOT-ASSISTED CONVERTED TO OPEN PROCEDURE;  Surgeon: Jordis Laneta FALCON, MD;  Location: [facility] ORS;  Service: General;  Laterality: N/A;    Family History  Problem Relation Age of Onset   Diabetes Mother    Hypertension Father    Breast cancer Neg Hx     Allergies[1]     Latest Ref Rng & Units 12/29/2023   12:10 PM 11/24/2023    4:09 PM 11/11/2023   12:12 PM  CBC  WBC 3.4 - 10.8 x10E3/uL 7.5  5.7    Hemoglobin 11.1 - 15.9 g/dL 85.5  84.4  85.3   Hematocrit 34.0 - 46.6 % 43.0  48.1  43.0   Platelets 150 - 450 x10E3/uL 270  271        CMP     Component Value Date/Time   NA 141 12/29/2023 1210   K 4.4 12/29/2023 1210   CL 101 12/29/2023 1210   CO2 27 12/29/2023 1210   GLUCOSE 77 12/29/2023 1210   GLUCOSE 90 11/24/2023 1609   BUN 5 (L) 12/29/2023 1210   CREATININE 0.59 12/29/2023 1210   CALCIUM  9.3 12/29/2023 1210   PROT 5.5 (L) 12/29/2023 1210   ALBUMIN  3.2 (L) 12/29/2023 1210   AST 17 12/29/2023 1210   ALT 13 12/29/2023 1210   ALKPHOS 103 12/29/2023 1210   BILITOT 0.8 12/29/2023 1210   GFR 104.54 07/08/2023 1155   EGFR 112 12/29/2023 1210   GFRNONAA >60 11/24/2023 1609     No results found.     Assessment & Plan:   1. Lymphedema of both lower extremities  (Primary) Recommend:  No surgery or intervention at this point in time.   The Patient is CEAP [id number].  The patient has been wearing compression for more than 12 weeks with no or little benefit.  The patient has been exercising daily for more than 12 weeks. The patient has been elevating and taking OTC pain medications for more than 12 weeks.  None of these have have eliminated the pain related to the lymphedema or the discomfort regarding excessive swelling and venous congestion.    I have reviewed my discussion with the patient regarding lymphedema and why it  causes symptoms.  Patient will continue wearing graduated compression on a daily basis. The patient should put the compression on first thing in the morning and removing them in the evening. The patient should not sleep in the compression.   In addition, behavioral modification throughout the day will be continued.  This will include frequent elevation (such as in a recliner), use of over the counter pain medications as needed and exercise such as walking.  The systemic causes for chronic edema such as liver, kidney and cardiac etiologies do not appear to have significant changed over the past year.    The patient has chronic , severe lymphedema with hyperpigmentation of the skin and has done MLD, skin care, medication, diet, exercise, elevation and compression for 4 weeks with no improvement,  I am recommending a lymphedema pump.  The patient still has stage 3 lymphedema and therefore, I believe that a lymph pump is needed to improve the control of the patient's lymphedema and improve the quality of life.  Additionally, a lymph pump is warranted because it will reduce the  risk of cellulitis and ulceration in the future.  Patient should follow-up in six months    Medications Ordered Prior to Encounter[2]  There are no Patient Instructions on file for this visit. Return in about 6 months (around 07/24/2024) for No studies  FB/GS.   Chantal Worthey E Avrey Flanagin, NP      [1]  Allergies Allergen Reactions   Penicillin G Hives and Itching    Tolerated first generation cephalosporin (CEFAZOLIN ) in the past with no documented ADRs   Amoxicillin Hives    Tolerated first generation cephalosporin (CEFAZOLIN ) in the past with no documented ADRs.   Shellfish Allergy Nausea And Vomiting  [2]  Current Outpatient Medications on File Prior to Visit  Medication Sig Dispense Refill   albuterol  (PROAIR  HFA) 108 (90 Base) MCG/ACT inhaler Inhale 2 puffs into the lungs every 6 (six) hours as needed for wheezing or shortness of breath. 108 each 3   cetirizine  (ZYRTEC  ALLERGY) 10 MG tablet Take 1 tablet (10 mg total) by mouth daily. 30 tablet 2   Cholecalciferol (VITAMIN D3) 50 MCG (2000 UT) TABS Take 2,000 Units by mouth in the morning.     cyanocobalamin  (VITAMIN B12) 1000 MCG/ML injection INJECT 1 ML (1,000 MCG TOTAL) INTO THE MUSCLE EVERY 14 (FOURTEEN) DAYS. 6 mL 0   ferrous sulfate 325 (65 FE) MG tablet Take 325 mg by mouth in the morning.     gabapentin  (NEURONTIN ) 600 MG tablet Take 1,200 mg by mouth 3 (three) times daily.     ipratropium (ATROVENT ) 0.06 % nasal spray Place 2 sprays into the nose 3 (three) times daily. 15 mL 2   lansoprazole  (PREVACID ) 30 MG capsule Take 1 capsule (30 mg total) by mouth daily at 12 noon. 90 capsule 3   levothyroxine  (SYNTHROID ) 100 MCG tablet Take 1 tablet (100 mcg total) by mouth daily. 30 tablet 11   linaclotide  (LINZESS ) 145 MCG CAPS capsule Take 1 capsule (145 mcg total) by mouth daily before breakfast. (Patient taking differently: Take 145 mcg by mouth as needed (constipation).) 90 capsule 3   meclizine  (ANTIVERT ) 25 MG tablet Take 1 tablet (25 mg total) by mouth 3 (three) times daily as needed for dizziness. 30 tablet 0   potassium chloride  SA (KLOR-CON  M) 20 MEQ tablet Take 2 tablets (40 mEq) today, then 2 tablets (40 mEq) prior to coming in for surgery tomorrow.  Follow-up with PCP for repeat  labs. 4 tablet 0   rosuvastatin  (CRESTOR ) 5 MG tablet TAKE 1 TABLET (5 MG TOTAL) BY MOUTH DAILY. 90 tablet 0   Syringe/Needle, Disp, (SYRINGE 3CC/20GX1) 20G X 1 3 ML MISC 1 each by Does not apply route every 14 (fourteen) days. For use with B12 injections 6 each 3   Vitamin D , Ergocalciferol , (DRISDOL ) 1.25 MG (50000 UNIT) CAPS capsule Take 1 capsule (50,000 Units total) by mouth every 7 (seven) days. 12 capsule 0   baclofen  (LIORESAL ) 10 MG tablet TAKE 1 TABLET BY MOUTH TWICE A DAY AS NEEDED FOR MUSCLE SPASM (Patient not taking: Reported on 12/29/2023) 180 tablet 1   doxycycline  (VIBRAMYCIN ) 100 MG capsule Take 1 capsule (100 mg total) by mouth 2 (two) times daily. (Patient not taking: Reported on 01/25/2024) 14 capsule 0   enoxaparin  (LOVENOX ) 40 MG/0.4ML injection Inject 0.4 mLs (40 mg total) into the skin daily. (Patient not taking: Reported on 01/25/2024) 12 mL 0   No current facility-administered medications on file prior to visit.   "

## 2024-02-15 ENCOUNTER — Other Ambulatory Visit: Payer: Self-pay | Admitting: Family

## 2024-02-17 NOTE — Telephone Encounter (Signed)
 Pt has seen pcp since this message was sent

## 2024-03-14 ENCOUNTER — Ambulatory Visit: Admitting: Podiatry

## 2024-03-28 ENCOUNTER — Ambulatory Visit: Admitting: Family

## 2024-07-24 ENCOUNTER — Ambulatory Visit (INDEPENDENT_AMBULATORY_CARE_PROVIDER_SITE_OTHER): Admitting: Nurse Practitioner
